# Patient Record
Sex: Female | Born: 1940 | Race: White | Hispanic: No | Smoking: Never smoker
Health system: Southern US, Community
[De-identification: ages and names within clinical notes are randomized; demographics above are authoritative.]

## PROBLEM LIST (undated history)

## (undated) DIAGNOSIS — K219 Gastro-esophageal reflux disease without esophagitis: Secondary | ICD-10-CM

## (undated) DIAGNOSIS — E039 Hypothyroidism, unspecified: Secondary | ICD-10-CM

## (undated) DIAGNOSIS — Z8719 Personal history of other diseases of the digestive system: Secondary | ICD-10-CM

## (undated) DIAGNOSIS — F32A Depression, unspecified: Secondary | ICD-10-CM

## (undated) DIAGNOSIS — F419 Anxiety disorder, unspecified: Secondary | ICD-10-CM

## (undated) DIAGNOSIS — Z87442 Personal history of urinary calculi: Secondary | ICD-10-CM

## (undated) DIAGNOSIS — F329 Major depressive disorder, single episode, unspecified: Secondary | ICD-10-CM

## (undated) DIAGNOSIS — M1990 Unspecified osteoarthritis, unspecified site: Secondary | ICD-10-CM

## (undated) DIAGNOSIS — M797 Fibromyalgia: Secondary | ICD-10-CM

## (undated) DIAGNOSIS — K449 Diaphragmatic hernia without obstruction or gangrene: Secondary | ICD-10-CM

## (undated) DIAGNOSIS — K5730 Diverticulosis of large intestine without perforation or abscess without bleeding: Secondary | ICD-10-CM

## (undated) DIAGNOSIS — K635 Polyp of colon: Secondary | ICD-10-CM

## (undated) HISTORY — DX: Anxiety disorder, unspecified: F41.9

## (undated) HISTORY — DX: Diverticulosis of large intestine without perforation or abscess without bleeding: K57.30

## (undated) HISTORY — DX: Personal history of other diseases of the digestive system: Z87.19

## (undated) HISTORY — DX: Fibromyalgia: M79.7

## (undated) HISTORY — DX: Hypothyroidism, unspecified: E03.9

## (undated) HISTORY — DX: Polyp of colon: K63.5

## (undated) HISTORY — DX: Diaphragmatic hernia without obstruction or gangrene: K44.9

## (undated) HISTORY — PX: HEMORRHOID SURGERY: SHX153

## (undated) HISTORY — PX: ANKLE SURGERY: SHX546

## (undated) HISTORY — DX: Major depressive disorder, single episode, unspecified: F32.9

## (undated) HISTORY — PX: RECTOCELE REPAIR: SHX761

## (undated) HISTORY — PX: BREAST BIOPSY: SHX20

## (undated) HISTORY — DX: Depression, unspecified: F32.A

## (undated) HISTORY — PX: TUBAL LIGATION: SHX77

## (undated) HISTORY — DX: Unspecified osteoarthritis, unspecified site: M19.90

## (undated) HISTORY — DX: Gastro-esophageal reflux disease without esophagitis: K21.9

## (undated) HISTORY — PX: ABDOMINAL HYSTERECTOMY: SHX81

---

## 1983-06-07 HISTORY — PX: APPENDECTOMY: SHX54

## 1983-06-07 HISTORY — PX: CHOLECYSTECTOMY: SHX55

## 1998-02-06 ENCOUNTER — Ambulatory Visit (HOSPITAL_COMMUNITY): Admission: RE | Admit: 1998-02-06 | Discharge: 1998-02-06 | Payer: Self-pay | Admitting: Family Medicine

## 1998-05-15 ENCOUNTER — Encounter: Admission: RE | Admit: 1998-05-15 | Discharge: 1998-08-13 | Payer: Self-pay

## 1998-06-17 ENCOUNTER — Ambulatory Visit: Admission: RE | Admit: 1998-06-17 | Discharge: 1998-06-17 | Payer: Self-pay

## 1998-09-29 ENCOUNTER — Encounter: Payer: Self-pay | Admitting: Gynecology

## 1998-09-29 ENCOUNTER — Ambulatory Visit (HOSPITAL_COMMUNITY): Admission: RE | Admit: 1998-09-29 | Discharge: 1998-09-29 | Payer: Self-pay | Admitting: Gynecology

## 1999-10-21 ENCOUNTER — Ambulatory Visit (HOSPITAL_COMMUNITY): Admission: RE | Admit: 1999-10-21 | Discharge: 1999-10-21 | Payer: Self-pay | Admitting: Gynecology

## 1999-10-21 ENCOUNTER — Encounter: Payer: Self-pay | Admitting: Gynecology

## 2000-01-19 ENCOUNTER — Encounter: Admission: RE | Admit: 2000-01-19 | Discharge: 2000-01-19 | Payer: Self-pay | Admitting: Family Medicine

## 2000-01-19 ENCOUNTER — Encounter: Payer: Self-pay | Admitting: Family Medicine

## 2000-10-23 ENCOUNTER — Encounter: Payer: Self-pay | Admitting: Gynecology

## 2000-10-23 ENCOUNTER — Ambulatory Visit (HOSPITAL_COMMUNITY): Admission: RE | Admit: 2000-10-23 | Discharge: 2000-10-23 | Payer: Self-pay | Admitting: Gynecology

## 2000-11-01 ENCOUNTER — Other Ambulatory Visit: Admission: RE | Admit: 2000-11-01 | Discharge: 2000-11-01 | Payer: Self-pay | Admitting: Gynecology

## 2001-10-11 ENCOUNTER — Encounter: Payer: Self-pay | Admitting: Family Medicine

## 2001-10-11 ENCOUNTER — Encounter: Admission: RE | Admit: 2001-10-11 | Discharge: 2001-10-11 | Payer: Self-pay | Admitting: Family Medicine

## 2001-10-26 ENCOUNTER — Emergency Department (HOSPITAL_COMMUNITY): Admission: EM | Admit: 2001-10-26 | Discharge: 2001-10-26 | Payer: Self-pay | Admitting: Emergency Medicine

## 2001-10-26 ENCOUNTER — Encounter: Payer: Self-pay | Admitting: Emergency Medicine

## 2001-11-05 ENCOUNTER — Other Ambulatory Visit: Admission: RE | Admit: 2001-11-05 | Discharge: 2001-11-05 | Payer: Self-pay | Admitting: Gynecology

## 2002-01-02 ENCOUNTER — Encounter: Payer: Self-pay | Admitting: Gastroenterology

## 2002-01-02 ENCOUNTER — Ambulatory Visit (HOSPITAL_COMMUNITY): Admission: RE | Admit: 2002-01-02 | Discharge: 2002-01-02 | Payer: Self-pay | Admitting: Gastroenterology

## 2002-01-02 DIAGNOSIS — K222 Esophageal obstruction: Secondary | ICD-10-CM

## 2002-03-12 ENCOUNTER — Encounter: Payer: Self-pay | Admitting: Gynecology

## 2002-03-12 ENCOUNTER — Ambulatory Visit (HOSPITAL_COMMUNITY): Admission: RE | Admit: 2002-03-12 | Discharge: 2002-03-12 | Payer: Self-pay | Admitting: Gynecology

## 2002-03-14 ENCOUNTER — Encounter: Admission: RE | Admit: 2002-03-14 | Discharge: 2002-03-14 | Payer: Self-pay | Admitting: Gynecology

## 2002-03-14 ENCOUNTER — Encounter: Payer: Self-pay | Admitting: Gynecology

## 2002-04-07 ENCOUNTER — Encounter: Admission: RE | Admit: 2002-04-07 | Discharge: 2002-04-07 | Payer: Self-pay | Admitting: Orthopedic Surgery

## 2002-04-07 ENCOUNTER — Encounter: Payer: Self-pay | Admitting: Orthopedic Surgery

## 2002-05-17 ENCOUNTER — Encounter: Payer: Self-pay | Admitting: Gastroenterology

## 2002-12-04 ENCOUNTER — Other Ambulatory Visit: Admission: RE | Admit: 2002-12-04 | Discharge: 2002-12-04 | Payer: Self-pay | Admitting: Gynecology

## 2003-04-10 ENCOUNTER — Encounter: Admission: RE | Admit: 2003-04-10 | Discharge: 2003-04-10 | Payer: Self-pay | Admitting: Gynecology

## 2003-12-03 ENCOUNTER — Other Ambulatory Visit: Admission: RE | Admit: 2003-12-03 | Discharge: 2003-12-03 | Payer: Self-pay | Admitting: Gynecology

## 2004-04-22 ENCOUNTER — Ambulatory Visit: Payer: Self-pay | Admitting: Family Medicine

## 2004-05-06 ENCOUNTER — Encounter: Admission: RE | Admit: 2004-05-06 | Discharge: 2004-05-06 | Payer: Self-pay | Admitting: Gynecology

## 2004-05-27 ENCOUNTER — Ambulatory Visit: Payer: Self-pay | Admitting: Family Medicine

## 2004-07-05 ENCOUNTER — Ambulatory Visit: Payer: Self-pay | Admitting: Family Medicine

## 2004-08-02 ENCOUNTER — Ambulatory Visit: Payer: Self-pay | Admitting: Family Medicine

## 2004-08-11 ENCOUNTER — Ambulatory Visit: Payer: Self-pay | Admitting: Family Medicine

## 2004-10-27 ENCOUNTER — Ambulatory Visit: Payer: Self-pay | Admitting: Family Medicine

## 2004-11-10 ENCOUNTER — Ambulatory Visit: Payer: Self-pay | Admitting: Family Medicine

## 2004-12-06 ENCOUNTER — Other Ambulatory Visit: Admission: RE | Admit: 2004-12-06 | Discharge: 2004-12-06 | Payer: Self-pay | Admitting: Gynecology

## 2004-12-22 ENCOUNTER — Ambulatory Visit: Payer: Self-pay | Admitting: Family Medicine

## 2005-02-22 ENCOUNTER — Ambulatory Visit: Payer: Self-pay | Admitting: Family Medicine

## 2005-03-28 ENCOUNTER — Ambulatory Visit: Payer: Self-pay | Admitting: Family Medicine

## 2005-04-29 ENCOUNTER — Ambulatory Visit: Payer: Self-pay | Admitting: Family Medicine

## 2005-06-22 ENCOUNTER — Encounter: Admission: RE | Admit: 2005-06-22 | Discharge: 2005-06-22 | Payer: Self-pay | Admitting: *Deleted

## 2005-07-05 ENCOUNTER — Observation Stay (HOSPITAL_COMMUNITY): Admission: RE | Admit: 2005-07-05 | Discharge: 2005-07-06 | Payer: Self-pay | Admitting: *Deleted

## 2005-12-01 ENCOUNTER — Other Ambulatory Visit: Admission: RE | Admit: 2005-12-01 | Discharge: 2005-12-01 | Payer: Self-pay | Admitting: *Deleted

## 2006-03-28 ENCOUNTER — Ambulatory Visit: Payer: Self-pay | Admitting: Family Medicine

## 2006-06-21 ENCOUNTER — Ambulatory Visit: Payer: Self-pay | Admitting: Family Medicine

## 2006-06-26 ENCOUNTER — Encounter: Admission: RE | Admit: 2006-06-26 | Discharge: 2006-06-26 | Payer: Self-pay | Admitting: *Deleted

## 2006-07-18 ENCOUNTER — Ambulatory Visit: Payer: Self-pay | Admitting: Family Medicine

## 2006-08-18 ENCOUNTER — Ambulatory Visit: Payer: Self-pay | Admitting: Family Medicine

## 2006-10-17 ENCOUNTER — Ambulatory Visit: Payer: Self-pay | Admitting: Family Medicine

## 2007-01-02 ENCOUNTER — Other Ambulatory Visit: Admission: RE | Admit: 2007-01-02 | Discharge: 2007-01-02 | Payer: Self-pay | Admitting: *Deleted

## 2007-05-02 ENCOUNTER — Encounter: Admission: RE | Admit: 2007-05-02 | Discharge: 2007-05-02 | Payer: Self-pay | Admitting: Family Medicine

## 2007-05-08 ENCOUNTER — Ambulatory Visit: Payer: Self-pay | Admitting: Gastroenterology

## 2007-05-10 ENCOUNTER — Encounter: Admission: RE | Admit: 2007-05-10 | Discharge: 2007-05-10 | Payer: Self-pay | Admitting: Family Medicine

## 2007-06-27 DIAGNOSIS — I10 Essential (primary) hypertension: Secondary | ICD-10-CM | POA: Insufficient documentation

## 2007-06-27 DIAGNOSIS — E039 Hypothyroidism, unspecified: Secondary | ICD-10-CM | POA: Insufficient documentation

## 2007-06-27 DIAGNOSIS — K449 Diaphragmatic hernia without obstruction or gangrene: Secondary | ICD-10-CM | POA: Insufficient documentation

## 2007-06-27 DIAGNOSIS — R109 Unspecified abdominal pain: Secondary | ICD-10-CM | POA: Insufficient documentation

## 2007-06-27 DIAGNOSIS — K589 Irritable bowel syndrome without diarrhea: Secondary | ICD-10-CM

## 2007-06-27 DIAGNOSIS — E538 Deficiency of other specified B group vitamins: Secondary | ICD-10-CM | POA: Insufficient documentation

## 2007-06-27 DIAGNOSIS — R197 Diarrhea, unspecified: Secondary | ICD-10-CM | POA: Insufficient documentation

## 2007-06-27 DIAGNOSIS — T7840XA Allergy, unspecified, initial encounter: Secondary | ICD-10-CM | POA: Insufficient documentation

## 2007-06-27 DIAGNOSIS — F32A Depression, unspecified: Secondary | ICD-10-CM | POA: Insufficient documentation

## 2007-06-27 DIAGNOSIS — K5900 Constipation, unspecified: Secondary | ICD-10-CM | POA: Insufficient documentation

## 2007-06-27 DIAGNOSIS — K219 Gastro-esophageal reflux disease without esophagitis: Secondary | ICD-10-CM | POA: Insufficient documentation

## 2007-06-27 DIAGNOSIS — M1990 Unspecified osteoarthritis, unspecified site: Secondary | ICD-10-CM | POA: Insufficient documentation

## 2007-06-27 DIAGNOSIS — A048 Other specified bacterial intestinal infections: Secondary | ICD-10-CM | POA: Insufficient documentation

## 2007-06-27 DIAGNOSIS — F411 Generalized anxiety disorder: Secondary | ICD-10-CM | POA: Insufficient documentation

## 2007-06-27 DIAGNOSIS — K429 Umbilical hernia without obstruction or gangrene: Secondary | ICD-10-CM | POA: Insufficient documentation

## 2007-06-27 DIAGNOSIS — F329 Major depressive disorder, single episode, unspecified: Secondary | ICD-10-CM

## 2007-06-27 DIAGNOSIS — K5909 Other constipation: Secondary | ICD-10-CM | POA: Insufficient documentation

## 2007-06-27 DIAGNOSIS — K3184 Gastroparesis: Secondary | ICD-10-CM

## 2007-09-18 ENCOUNTER — Encounter: Admission: RE | Admit: 2007-09-18 | Discharge: 2007-09-18 | Payer: Self-pay | Admitting: *Deleted

## 2008-01-04 ENCOUNTER — Encounter: Admission: RE | Admit: 2008-01-04 | Discharge: 2008-01-04 | Payer: Self-pay | Admitting: Family Medicine

## 2008-02-28 ENCOUNTER — Other Ambulatory Visit: Admission: RE | Admit: 2008-02-28 | Discharge: 2008-02-28 | Payer: Self-pay | Admitting: Gynecology

## 2008-09-18 ENCOUNTER — Encounter: Admission: RE | Admit: 2008-09-18 | Discharge: 2008-09-18 | Payer: Self-pay | Admitting: Gynecology

## 2009-02-24 ENCOUNTER — Telehealth: Payer: Self-pay | Admitting: Gastroenterology

## 2009-04-08 ENCOUNTER — Ambulatory Visit: Payer: Self-pay | Admitting: Gastroenterology

## 2009-10-06 ENCOUNTER — Ambulatory Visit: Payer: Self-pay | Admitting: Gastroenterology

## 2009-10-08 ENCOUNTER — Encounter (INDEPENDENT_AMBULATORY_CARE_PROVIDER_SITE_OTHER): Payer: Self-pay | Admitting: *Deleted

## 2009-10-08 ENCOUNTER — Encounter: Admission: RE | Admit: 2009-10-08 | Discharge: 2009-10-08 | Payer: Self-pay | Admitting: Gynecology

## 2009-10-19 ENCOUNTER — Encounter (INDEPENDENT_AMBULATORY_CARE_PROVIDER_SITE_OTHER): Payer: Self-pay | Admitting: *Deleted

## 2009-10-21 ENCOUNTER — Ambulatory Visit: Payer: Self-pay | Admitting: Gastroenterology

## 2009-11-04 ENCOUNTER — Ambulatory Visit: Payer: Self-pay | Admitting: Gastroenterology

## 2009-11-06 ENCOUNTER — Encounter: Payer: Self-pay | Admitting: Gastroenterology

## 2009-11-13 ENCOUNTER — Ambulatory Visit: Payer: Self-pay | Admitting: Gastroenterology

## 2010-07-06 NOTE — Letter (Signed)
Summary: Patient [facility] Biopsy Results  [facility]  [address] [place], [place] [address]   Phone: [phone]  Fax: [phone]        November 06, 2009 MRN: [id number]    [facility] [address] [place], [place]  [address]    Dear Ms. Peffley,  I am pleased to inform you that the biopsies taken during your recent endoscopic examination did not show any evidence of cancer upon pathologic examination.No evidence of Helicobacter infection seen on biopsy review.  Additional information/recommendations:  __No further action is needed at this time.  Please follow-up with      your primary care physician for your other healthcare needs.  __ Please call [phone] to schedule a return visit to review      your condition.  x__ Continue with the treatment plan as outlined on the day of your      exam.Please continue treatment for acid reflux disease.  __ You should have a repeat endoscopic examination for this problem              in _ months/years.   Please call us if you are having persistent problems or have questions about your condition that have not been fully answered at this time.  Sincerely,  Mardella Layman MD [facility]  This letter has been electronically signed by your physician.  Appended Document: Patient Notice-Endo Biopsy Results letter mailed.

## 2010-07-06 NOTE — Miscellaneous (Signed)
Summary: Orders Update Clotest  Clinical Lists Changes  Orders: Added new Test order of TLB-H Pylori Screen Gastric Biopsy (83013-CLOTEST) - Signed 

## 2010-07-06 NOTE — Letter (Signed)
Summary: Previsit letter  [facility]  [address] [place], [place] [address]   Phone: [phone]  Fax: [phone]       10/08/2009 MRN: [id number]  [facility] [address] [place], [place]  [address]  Dear Ms. Canevari,  Welcome to the Gastroenterology Division at [facility].    You are scheduled to see a nurse for your pre-procedure visit on 10-21-09 at 11:00a.m. on the 3rd floor at [facility], [address]  We ask that you try to arrive at our office 15 minutes prior to your appointment time to allow for check-in.  Your nurse visit will consist of discussing your medical and surgical history, your immediate family medical history, and your medications.    Please bring a complete list of all your medications or, if you prefer, bring the medication bottles and we will list them.  We will need to be aware of both prescribed and over the counter drugs.  We will need to know exact dosage information as well.  If you are on blood thinners (Coumadin, Plavix, Aggrenox, Ticlid, etc.) please call our office today/prior to your appointment, as we need to consult with your physician about holding your medication.   Please be prepared to read and sign documents such as consent forms, a financial agreement, and acknowledgement forms.  If necessary, and with your consent, a friend or relative is welcome to sit-in on the nurse visit with you.  Please bring your insurance card so that we may make a copy of it.  If your insurance requires a referral to see a specialist, please bring your referral form from your primary care physician.  No co-pay is required for this nurse visit.     If you cannot keep your appointment, please call [phone] to cancel or reschedule prior to your appointment date.  This allows [place] the opportunity to schedule an appointment for another patient in need of care.    Thank you for choosing [facility] for your medical needs.  We  appreciate the opportunity to care for you.  Please visit [place] at our website  to learn more about our practice.                     Sincerely.                                                                                                                   The Gastroenterology Division

## 2010-07-06 NOTE — Procedures (Signed)
Summary: Colonoscopy  Patient: Stefanie [name] Note: All result statuses are Final unless otherwise noted.  Tests: (1) Colonoscopy (COL)   COL Colonoscopy           DONE     [facility]     [address]     [place], [place]  [address]           COLONOSCOPY PROCEDURE REPORT           PATIENT:  Stefanie, [name]  MR#:  [id number]     BIRTHDATE:  1941/03/28, 69 yrs. old  GENDER:  female     ENDOSCOPIST:  Vania Rea. Jarold Motto, MD, [facility]     REF. BY:     PROCEDURE DATE:  11/04/2009     PROCEDURE:  Colonoscopy with snare polypectomy     ASA CLASS:  Class II     INDICATIONS:  colorectal cancer screening, average risk     MEDICATIONS:   Fentanyl 75 mcg IV, Versed 9 mg IV           DESCRIPTION OF PROCEDURE:   After the risks benefits and     alternatives of the procedure were thoroughly explained, informed     consent was obtained.  Digital rectal exam was performed and     revealed no abnormalities.   The [id number] endoscope was     introduced through the anus and advanced to the cecum, which was     identified by both the appendix and ileocecal valve, without     limitations.  The quality of the prep was adequate, using     MoviPrep.  The instrument was then slowly withdrawn as the colon     was fully examined.     <<PROCEDUREIMAGES>>           FINDINGS:  Moderate diverticulosis was found in the sigmoid to     descending colon segments. RED,THICKENED,HAUSTRAL FOLDS NOTED.  A     sessile polyp was found. FLAT POLYP SNARE EXCISED.COLD.     Retroflexed views in the rectum revealed no abnormalities.    The     scope was then withdrawn from the patient and the procedure     completed.           COMPLICATIONS:  None     ENDOSCOPIC IMPRESSION:     1) Moderate diverticulosis in the sigmoid to descending colon     segments     2) Sessile polyp     HX. OF ADHESIONS AND CHRONIC PAIN.     RECOMMENDATIONS:     1) Follow up colonoscopy in 5 years     2) High fiber diet.  REPEAT EXAM:  No           ______________________________     Vania Rea. Jarold Motto, MD, [facility]           CC:  Joette Catching, MD           n.     Rosalie Doctor:   Vania Rea. Haynes Giannotti at 11/04/2009 10:02 AM           Daleen Squibb, [place], [id number]  Note: An exclamation mark (!) indicates a result that was not dispersed into the flowsheet. Document Creation Date: 11/04/2009 10:04 AM _______________________________________________________________________  (1) Order result status: Final Collection or observation date-time: 11/04/2009 09:56 Requested date-time:  Receipt date-time:  Reported date-time:  Referring Physician:   Ordering Physician: Sheryn Bison [phone]) Specimen Source:  Source: Launa Grill Order Number: [id number]  Lab site:   Appended Document: Colonoscopy f/u 10y ,not 5.  Appended Document: Colonoscopy     Procedures Next Due Date:    Colonoscopy: 11/2019

## 2010-07-06 NOTE — Assessment & Plan Note (Signed)
Summary: Post Proc, 2 weeks. dfs    History of Present Illness Visit Type: Follow-up Visit Primary GI MD: Sheryn Bison MD FACP FAGA Primary Provider: Harlon Ditty Nyland,MD Requesting Provider: n/a Chief Complaint: f/u ECL, Pt states her dysphagia is much better but still has problems with allergies so her food gets hung up with drainage from that. No other GI sx at this time.  History of Present Illness:   Her dysphasia has resolved and she is taking daily Zegerid and denies GERD symptoms. She continues with a vague gas, bloating, and right lower quadrant-right inguinal discomfort and mild constipation. She also is on daily Synthroid, Lexapro, vitamin D, and passes taken probiotics. She denies melena, hematochezia, hepatobiliary complaints, et Karie Soda. She recently completed endoscopy and colonoscopy esophageal dilatation. Gastric evaluation for Helicobacter are negative.   GI Review of Systems      Denies abdominal pain, acid reflux, belching, bloating, chest pain, dysphagia with liquids, dysphagia with solids, heartburn, loss of appetite, nausea, vomiting, vomiting blood, weight loss, and  weight gain.        Denies anal fissure, black tarry stools, change in bowel habit, constipation, diarrhea, diverticulosis, fecal incontinence, heme positive stool, hemorrhoids, irritable bowel syndrome, jaundice, light color stool, liver problems, rectal bleeding, and  rectal pain.    Current Medications (verified): 1)  Synthroid 50 Mcg Tabs (Levothyroxine Sodium) .... Qd 2)  Lexapro 10 Mg Tabs (Escitalopram Oxalate) .... 1/2 By Mouth Once Daily At Bedtime 3)  Zegerid 40-1100 Mg Caps (Omeprazole-Sodium Bicarbonate) .... Qd 4)  Nasacort Aq 55 Mcg/act Aers (Triamcinolone Acetonide(Nasal)) .... Qd 5)  Vitamin D (Ergocalciferol) 50000 Unit Caps (Ergocalciferol) .Marland Kitchen.. 1 By Mouth Every Week  Allergies (verified): 1)  ! * Novacaine 2)  ! Reglan 3)  ! Zithromax 4)  ! Penicillin  Past  History:  Past medical, surgical, family and social histories (including risk factors) reviewed for relevance to current acute and chronic problems.  Past Medical History: Reviewed history from 06/27/2007 and no changes required. Current Problems:  ESOPHAGEAL STRICTURE (ICD-530.3) ALLERGY (ICD-995.3) B12 DEFICIENCY (ICD-266.2) DEGENERATIVE JOINT DISEASE (ICD-715.90) HYPERTENSION (ICD-401.9) DEPRESSION (ICD-311) ANXIETY DISORDER (ICD-300.00) HYPOTHYROIDISM (ICD-244.9) GERD (ICD-530.81) * INTESTINAL ADHESIONS ABDOMINAL PAIN, CHRONIC (ICD-789.00) PERIUMBILICAL HERNIA (ICD-553.1) HELICOBACTER PYLORI GASTRITIS (ICD-041.86) GASTROPARESIS (ICD-536.3) HIATAL HERNIA (ICD-553.3) CONSTIPATION (ICD-564.00) DIARRHEA (ICD-787.91) IBS (ICD-564.1)  Past Surgical History: Reviewed history from 06/27/2007 and no changes required. cystocele and rectocele repair cholecystectomy hemorrhoidectomy hysterectomy and bilateral oophorectomy appendectomy  Family History: Reviewed history from 10/06/2009 and no changes required. No FH of Colon Cancer: Family History of Heart Disease: Father CHF Mother had COPD  Social History: Reviewed history from 10/06/2009 and no changes required. Patient has never smoked.  Alcohol Use - no Daily Caffeine Use  on occasion Illicit Drug Use - no  Review of Systems  The patient denies allergy/sinus, anemia, anxiety-new, arthritis/joint pain, back pain, blood in urine, breast changes/lumps, change in vision, confusion, cough, coughing up blood, depression-new, fainting, fatigue, fever, headaches-new, hearing problems, heart murmur, heart rhythm changes, itching, menstrual pain, muscle pains/cramps, night sweats, nosebleeds, pregnancy symptoms, shortness of breath, skin rash, sleeping problems, sore throat, swelling of feet/legs, swollen lymph glands, thirst - excessive , urination - excessive , urination changes/pain, urine leakage, vision changes, and voice  change.    Vital Signs:  Patient profile:   70 year old female Height:      66 inches Weight:      188.38 pounds BMI:     30.52 Pulse rate:   80 /  minute Pulse rhythm:   regular BP sitting:   124 / 80  (right arm) Cuff size:   regular  Vitals Entered By: Christie Nottingham CMA Duncan Dull) (November 13, 2009 10:40 AM)  Physical Exam  General:  Well developed, well nourished, no acute distress.healthy appearing.  healthy appearing.   Head:  Normocephalic and atraumatic. Eyes:  PERRLA, no icterus.exam deferred to patient's ophthalmologist.  exam deferred to patient's ophthalmologist.   Abdomen:  Soft, nontender and nondistended. No masses, hepatosplenomegaly or hernias noted. Normal bowel sounds.obese.  obese.   Psych:  Alert and cooperative. Normal mood and affect.   Impression & Recommendations:  Problem # 1:  ESOPHAGEAL STRICTURE (ICD-530.3) Assessment Improved Continue chronic reflux maneuvers and daily Zegerid 40 mg 30 minutes before the first meal the day.  Problem # 2:  ABDOMINAL PAIN, CHRONIC (ICD-789.00) Assessment: Improved High fiber diet as tolerated and we will try [employer] Colon Health fiber-probiotic pill twice a day.  Problem # 3:  B12 DEFICIENCY (ICD-266.2) Assessment: Improved Continued weekly B12 nasal spray  Patient Instructions: 1)  Continue Zegerid. 2)  Begin [facility] two times a day. OTC. 3)  The medication list was reviewed and reconciled.  All changed / newly prescribed medications were explained.  A complete medication list was provided to the patient / caregiver. 4)  Copy sent to : Dr. Joette Catching. 5)  Please continue current medications.  6)  Avoid foods high in acid content ( tomatoes, citrus juices, spicy foods) . Avoid eating within 3 to 4 hours of lying down or before exercising. Do not over eat; try smaller more frequent meals. Elevate head of bed four inches when sleeping.  7)  High Fiber, Low Fat  Healthy Eating Plan brochure given.    Appended Document: Post Proc, 2 weeks. dfs    Clinical Lists Changes  Medications: Added new medication of [employer] COLON HEALTH  CAPS (PROBIOTIC PRODUCT) two times a day

## 2010-07-06 NOTE — Miscellaneous (Signed)
Summary: lec pREVISIT/PREP  Clinical Lists Changes  Medications: Added new medication of MOVIPREP 100 GM  SOLR (PEG-KCL-NACL-NASULF-NA ASC-C) As per prep instructions. - Signed Rx of MOVIPREP 100 GM  SOLR (PEG-KCL-NACL-NASULF-NA ASC-C) As per prep instructions.;  #1 x 0;  Signed;  Entered by: Wyona Almas RN;  Authorized by: Mardella Layman MD [facility];  Method used: Electronically to [facility]*, [address], [place], [place], [place]  [address], Ph: [phone], Fax: [phone] Observations: Added new observation of ALLERGY REV: Done (10/21/2009 10:49)    Prescriptions: MOVIPREP 100 GM  SOLR (PEG-KCL-NACL-NASULF-NA ASC-C) As per prep instructions.  #1 x 0   Entered by:   Wyona Almas RN   Authorized by:   Mardella Layman MD [facility]   Signed by:   Wyona Almas RN on 10/21/2009   Method used:   Electronically to        [facility] (retail)       [address]       [place], [place]  [address]       Ph: [phone]       Fax: [phone]   RxID:   [id number]

## 2010-07-06 NOTE — Procedures (Signed)
Summary: Upper Endoscopy  Patient: Stefanie [name] Note: All result statuses are Final unless otherwise noted.  Tests: (1) Upper Endoscopy (EGD)   EGD Upper Endoscopy       DONE     [facility]     [address]     [place], [place]  [address]           ENDOSCOPY PROCEDURE REPORT           PATIENT:  [name], [name]  MR#:  [id number]     BIRTHDATE:  Sep 26, 1940, 69 yrs. old  GENDER:  female           ENDOSCOPIST:  Vania Rea. Jarold Motto, MD, [facility]     Referred by:           PROCEDURE DATE:  11/04/2009     PROCEDURE:  EGD with biopsy, Elease Hashimoto Dilation of Esophagus     ASA CLASS:  Class II     INDICATIONS:  dysphagia, GERD           MEDICATIONS:   There was residual sedation effect present from     prior procedure., Fentanyl 25 mcg IV, Versed 1 mg IV     TOPICAL ANESTHETIC:           DESCRIPTION OF PROCEDURE:   After the risks benefits and     alternatives of the procedure were thoroughly explained, informed     consent was obtained.  The LB GIF-H180 [id number] endoscope was     introduced through the mouth and advanced to the second portion of     the duodenum, without limitations.  The instrument was slowly     withdrawn as the mucosa was fully examined.     <<PROCEDUREIMAGES>>           A stricture was found. SMOOTH,BENIGN,FIBROTIC STRICTURE DILATED     #64F MALONEY DILATOR.  A hiatal hernia was found. 5 CM. 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 DIFFUSE REDNESS, EDEMA, AND GASTRIC     INFLAMMATION BIOPSIED AND CLO BX. DONE.  Normal duodenal folds     were noted.  The esophagus and gastroesophageal junction were     completely normal in appearance. SCARRING OD DISTAL ESOPHAGUS     NOTED.    Retroflexed views revealed a hiatal hernia.    The scope     was then withdrawn from the patient and the procedure completed.     COMPLICATIONS:  None           ENDOSCOPIC IMPRESSION:     1) Stricture     2) Hiatal hernia     3) Gastropathy     4) Normal duodenal folds     5) Normal  esophagus     6) A hiatal hernia     #1.CHRONIC GERD AND HH     #2.ESOPHAGEAL STRICTURE DILATED.     #3.R/O H.PYLORI./.     RECOMMENDATIONS:     1) Anti-reflux regimen to be follow     2) Await pathology results     3) Clear liquids until, then soft foods rest iof day. Resume     prior diet tomorrow.     4) Rx CLO if positive     5) continue PPI           REPEAT EXAM:  No           ______________________________     Vania Rea. Jarold Motto, MD, [facility]  CC:  Joette Catching, MD           n.     Rosalie DoctorVania Rea. Wylee Dorantes at 11/04/2009 10:17 AM           Daleen Squibb, [place], [id number]  Note: An exclamation mark (!) indicates a result that was not dispersed into the flowsheet. Document Creation Date: 11/04/2009 10:18 AM _______________________________________________________________________  (1) Order result status: Final Collection or observation date-time: 11/04/2009 10:10 Requested date-time:  Receipt date-time:  Reported date-time:  Referring Physician:   Ordering Physician: Sheryn Bison [phone]) Specimen Source:  Source: Launa Grill Order Number: [id number] Lab site:

## 2010-07-06 NOTE — Assessment & Plan Note (Signed)
Summary: dysphagia--ch.    History of Present Illness Visit Type: Follow-up Visit Primary GI MD: Sheryn Bison MD FACP FAGA Primary Provider: Harlon Ditty Nyland,MD Requesting Provider: Mickle Plumb Chief Complaint: Consult for Colon/Endo  pt c/o dysphagia History of Present Illness:   70 year old Caucasian female with chronic acid reflux has had progressive solid food dysphagia. She is on daily Zegerid 40 mg. She was last seen several years ago and endoscopy and followup colonoscopy was suggested but not completed. She denies lower gastrointestinal problems at this time. She's had a lot of stress related GI issues including gas, bloating, and abdominal pain. Since she was last seen she has had repair of a cystocele and rectocele. She's had multiple surgeries including cholecystectomy, hysterectomy with bilateral removal of her ovaries, and appendectomy. She has no current symptoms consistent with bowel obstruction, and denies anorexia, weight loss, or a specific food intolerances. She has had multiple drug reactions in the past and are listed in her chart. She denies concurrent hepatobiliary complaints.She Is Not Abusing NSAIDs, alcohol or cigarettes.   GI Review of Systems    Reports abdominal pain, dysphagia with solids, and  nausea.      Denies acid reflux, belching, bloating, chest pain, dysphagia with liquids, heartburn, loss of appetite, vomiting, vomiting blood, weight loss, and  weight gain.      Reports change in bowel habits and  diarrhea.     Denies anal fissure, black tarry stools, constipation, diverticulosis, fecal incontinence, heme positive stool, hemorrhoids, irritable bowel syndrome, jaundice, light color stool, liver problems, rectal bleeding, and  rectal pain. Preventive Screening-Counseling & Management  Alcohol-Tobacco     Smoking Status: never      Drug Use:  no.      Current Medications (verified): 1)  Synthroid 50 Mcg Tabs (Levothyroxine Sodium) ....  Qd 2)  Lexapro 10 Mg Tabs (Escitalopram Oxalate) .... 1/2 By Mouth Once Daily At Bedtime 3)  Zegerid 40-1100 Mg Caps (Omeprazole-Sodium Bicarbonate) .... Qd 4)  Nasacort Aq 55 Mcg/act Aers (Triamcinolone Acetonide(Nasal)) .... Qd 5)  Vitamin D (Ergocalciferol) 50000 Unit Caps (Ergocalciferol) .Marland Kitchen.. 1 By Mouth Every Week  Allergies (verified): 1)  ! * Novacaine 2)  ! Reglan 3)  ! Zithromax 4)  ! Penicillin  Past History:  Past medical, surgical, family and social histories (including risk factors) reviewed for relevance to current acute and chronic problems.  Past Medical History: Reviewed history from 06/27/2007 and no changes required. Current Problems:  ESOPHAGEAL STRICTURE (ICD-530.3) ALLERGY (ICD-995.3) B12 DEFICIENCY (ICD-266.2) DEGENERATIVE JOINT DISEASE (ICD-715.90) HYPERTENSION (ICD-401.9) DEPRESSION (ICD-311) ANXIETY DISORDER (ICD-300.00) HYPOTHYROIDISM (ICD-244.9) GERD (ICD-530.81) * INTESTINAL ADHESIONS ABDOMINAL PAIN, CHRONIC (ICD-789.00) PERIUMBILICAL HERNIA (ICD-553.1) HELICOBACTER PYLORI GASTRITIS (ICD-041.86) GASTROPARESIS (ICD-536.3) HIATAL HERNIA (ICD-553.3) CONSTIPATION (ICD-564.00) DIARRHEA (ICD-787.91) IBS (ICD-564.1)  Past Surgical History: Reviewed history from 06/27/2007 and no changes required. cystocele and rectocele repair cholecystectomy hemorrhoidectomy hysterectomy and bilateral oophorectomy appendectomy  Family History: Reviewed history and no changes required. No FH of Colon Cancer: Family History of Heart Disease: Father CHF Mother had COPD  Social History: Reviewed history and no changes required. Patient has never smoked.  Alcohol Use - no Daily Caffeine Use  on occasion Illicit Drug Use - no Smoking Status:  never Drug Use:  no  Review of Systems       The patient complains of allergy/sinus, arthritis/joint pain, and back pain.  The patient denies anemia, anxiety-new, blood in urine, breast changes/lumps, change in  vision, confusion, cough, coughing up blood, depression-new, fainting, fatigue, fever, headaches-new,  hearing problems, heart murmur, heart rhythm changes, itching, menstrual pain, muscle pains/cramps, night sweats, nosebleeds, pregnancy symptoms, shortness of breath, skin rash, sleeping problems, sore throat, swelling of feet/legs, swollen lymph glands, thirst - excessive , urination - excessive , urination changes/pain, urine leakage, vision changes, and voice change.    Vital Signs:  Patient profile:   70 year old female Height:      66 inches Weight:      190 pounds BMI:     30.78 BSA:     1.96 Pulse rate:   80 / minute Pulse rhythm:   regular BP sitting:   126 / 84  (left arm)  Vitals Entered By: Merri Ray CMA Duncan Dull) (Oct 06, 2009 3:48 PM)  Physical Exam  General:  Well developed, well nourished, no acute distress.healthy appearing.   Head:  Normocephalic and atraumatic. Lungs:  Clear throughout to auscultation. Heart:  Regular rate and rhythm; no murmurs, rubs,  or bruits. Abdomen:  Soft, nontender and nondistended. No masses, hepatosplenomegaly or hernias noted. Normal bowel sounds. Rectal:  deferred until time of colonoscopy.   Extremities:  No clubbing, cyanosis, edema or deformities noted. Neurologic:  Alert and  oriented x4;  grossly normal neurologically. Inguinal Nodes:  No significant inguinal adenopathy. Psych:  Alert and cooperative. Normal mood and affect.   Impression & Recommendations:  Problem # 1:  GERD (ICD-530.81) Assessment Improved Continue reflex edema daily Zegerid. Because of her dysphagia we have scheduled endoscopy and possible dilatation. The risk and benefits of this procedure have been explained to her in detail. She is concerned her the possibility of celiac disease, and small bowel biopsy will be obtained along with examination for H. pylori.  Problem # 2:  ABDOMINAL PAIN, CHRONIC (ICD-789.00) Assessment: Improved Followup screening  colonoscopy scheduled at her convenience. She is continue high-fiber diet as tolerated.  Patient Instructions: 1)  Please continue current medications.  2)  Please stop back by to schedule endoscopy and colonoscopy at your convience. 3)  The medication list was reviewed and reconciled.  All changed / newly prescribed medications were explained.  A complete medication list was provided to the patient / caregiver. 4)  Copy sent to : Dr. Joette Catching. 5)  Please continue current medications.  6)  Constipation and Hemorrhoids brochure given.  7)  Colonoscopy and Flexible Sigmoidoscopy brochure given.  8)  Conscious Sedation brochure given.  9)  Upper Endoscopy with Dilatation brochure given.   Appended Document: dysphagia--ch. Pt will call back to sch procedures after talking with Dtr.  Pt knows she will need previsit prior to proc.

## 2010-07-06 NOTE — Letter (Signed)
Summary: [facility] Instructions  [facility]  [address] [place], [place] [address]   Phone: [phone]  Fax: [phone]       [facility]    08-Jun-1940    MRN: [id number]        Procedure Day Dorna Bloom:  [facility]  11/04/09     Arrival Time:  8:30AM     Procedure Time:  9:30AM     Location of Procedure:                    _X _  [facility] (4th Floor)                       PREPARATION FOR COLONOSCOPY WITH MOVIPREP   Starting 5 days prior to your procedure 10/30/09 do not eat nuts, seeds, popcorn, corn, beans, peas,  salads, or any raw vegetables.  Do not take any fiber supplements (e.g. Metamucil, Citrucel, and Benefiber).  THE DAY BEFORE YOUR PROCEDURE         DATE: 11/03/09  DAY: TUESDAY  1.  Drink clear liquids the entire day-NO SOLID FOOD  2.  Do not drink anything colored red or purple.  Avoid juices with pulp.  No orange juice.  3.  Drink at least 64 oz. (8 glasses) of fluid/clear liquids during the day to prevent dehydration and help the prep work efficiently.  CLEAR LIQUIDS INCLUDE: Water Jello Ice Popsicles Tea (sugar ok, no milk/cream) Powdered fruit flavored drinks Coffee (sugar ok, no milk/cream) Gatorade Juice: apple, white grape, white cranberry  Lemonade Clear bullion, consomm, broth Carbonated beverages (any kind) Strained chicken noodle soup Hard Candy                             4.  In the morning, mix first dose of MoviPrep solution:    Empty 1 Pouch A and 1 Pouch B into the disposable container    Add lukewarm drinking water to the top line of the container. Mix to dissolve    Refrigerate (mixed solution should be used within 24 hrs)  5.  Begin drinking the prep at 5:00 p.m. The MoviPrep container is divided by 4 marks.   Every 15 minutes drink the solution down to the next mark (approximately 8 oz) until the full liter is complete.   6.  Follow completed prep with 16 oz of clear liquid of your choice (Nothing  red or purple).  Continue to drink clear liquids until bedtime.  7.  Before going to bed, mix second dose of MoviPrep solution:    Empty 1 Pouch A and 1 Pouch B into the disposable container    Add lukewarm drinking water to the top line of the container. Mix to dissolve    Refrigerate  THE DAY OF YOUR PROCEDURE      DATE: 11/04/09  DAY: WEDNESDAY  Beginning at 4:30AM (5 hours before procedure):         1. Every 15 minutes, drink the solution down to the next mark (approx 8 oz) until the full liter is complete.  2. Follow completed prep with 16 oz. of clear liquid of your choice.    3. You may drink clear liquids until 7:30AM (2 HOURS BEFORE PROCEDURE).   MEDICATION INSTRUCTIONS  Unless otherwise instructed, you should take regular prescription medications with a small sip of water   as early as possible the morning of  your procedure.           OTHER INSTRUCTIONS  You will need a responsible adult at least 70 years of age to accompany you and drive you home.   This person must remain in the waiting room during your procedure.  Wear loose fitting clothing that is easily removed.  Leave jewelry and other valuables at home.  However, you may wish to bring a book to read or  an iPod/MP3 player to listen to music as you wait for your procedure to start.  Remove all body piercing jewelry and leave at home.  Total time from sign-in until discharge is approximately 2-3 hours.  You should go home directly after your procedure and rest.  You can resume normal activities the  day after your procedure.  The day of your procedure you should not:   Drive   Make legal decisions   Operate machinery   Drink alcohol   Return to work  You will receive specific instructions about eating, activities and medications before you leave.    The above instructions have been reviewed and explained to me by   Wyona Almas RN  Oct 21, 2009 11:55 AM      I fully understand and  can verbalize these instructions _____________________________ Date _________

## 2010-07-06 NOTE — Letter (Signed)
Summary: Patient Notice- Polyp Results  [facility]  [address] [place], [place] [address]   Phone: [phone]  Fax: [phone]        November 06, 2009 MRN: [id number]    [facility] [address] [place], [place]  [address]    Dear Stefanie [name],  I am pleased to inform you that the colon polyp(s) removed during your recent colonoscopy was (were) found to be benign (no cancer detected) upon pathologic examination.  I recommend you have a repeat colonoscopy examination in 10_ years to look for recurrent polyps, as having colon polyps increases your risk for having recurrent polyps or even colon cancer in the future.No adenomatous polyps noted on exam.  Should you develop new or worsening symptoms of abdominal pain, bowel habit changes or bleeding from the rectum or bowels, please schedule an evaluation with either your primary care physician or with me.  Additional information/recommendations:  __ No further action with gastroenterology is needed at this time. Please      follow-up with your primary care physician for your other healthcare      needs.  __ Please call [phone] to schedule a return visit to review your      situation.  __ Please keep your follow-up visit as already scheduled.  x__ Continue treatment plan as outlined the day of your exam.  Please call us if you are having persistent problems or have questions about your condition that have not been fully answered at this time.  Sincerely,  Mardella Layman MD [facility]  This letter has been electronically signed by your physician.  Appended Document: Patient Notice- Polyp Results letter mailed.

## 2010-09-16 ENCOUNTER — Other Ambulatory Visit: Payer: Self-pay | Admitting: Gynecology

## 2010-09-16 DIAGNOSIS — Z1231 Encounter for screening mammogram for malignant neoplasm of breast: Secondary | ICD-10-CM

## 2010-10-11 ENCOUNTER — Ambulatory Visit
Admission: RE | Admit: 2010-10-11 | Discharge: 2010-10-11 | Disposition: A | Discharge status: Home / Self Care | Payer: MEDICARE | Source: Ambulatory Visit | Attending: Gynecology | Admitting: Gynecology

## 2010-10-11 DIAGNOSIS — Z1231 Encounter for screening mammogram for malignant neoplasm of breast: Secondary | ICD-10-CM

## 2010-10-19 NOTE — Assessment & Plan Note (Signed)
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  She comes  to the office for scheduled followup colonoscopy evaluation.   HISTORY OF PRESENT ILLNESS:  Stefanie [name] has had chronic irritable bowel  syndrome with alternating diarrhea and constipation and had a previous  rather extensive GI workup in 2003 which included endoscopic exam  showing a 5 cm hiatal hernia, evidence of gastroparesis, and apparently  a negative colonoscopy exam.  Her past history at that time was  remarkable for chronic IBS, previously  being treated for Helicobacter  pylori gastritis, and a history of a periumbilical hernia.  As mentioned  above, extensive GI evaluation includes numerous stool exams and  radiographic procedures were all normal and it was felt that a lot of  her chronic abdominal pain was related to intestinal adhesions.  She did  have a small bowel series in 1995.  Really, I have not seen this patient  since 2003 and she has been treated for acid reflux and constipation.   She currently has mild constipation but denies rectal bleeding.  Despite  being on daily Zegerid, she continues with reflux symptoms but denies  dysphagia or any specific hepatobiliary complaints.  I cannot see, in  reviewing her chart, previous CT scans or ultrasound reports.  She  follows a regular diet, denies any specific food intolerances.  She  apparently is status post cholecystectomy since she was last seen.  She  has also had repair of a cystocele and rectocele by Dr. Melba Coon which  seems to have helped her GI complaints greatly.  She has a good appetite  and has gained 13 pounds of weight over the last several years.   PAST MEDICAL HISTORY:  Is somewhat extensive and  revolves around chronic  thyroid dysfunction, chronic anxiety and depression, and essential  hypertension along with degenerative arthritis.  In addition to  cholecystectomy she has had hemorrhoidectomy, hysterectomy and bilateral  oophorectomy, and appendectomy.  In addition to depression, she carries  a diagnosis of chronic anxiety disorder.   MEDICATIONS:  1. Zegerid 40 mg a day.  2. Synthroid 50 mcg a day.  3. Lexapro 5 mg a day.  4. HCTZ 35 mg a day.  5. Hormone replacement patch twice weekly.  6. Nasacort nose spray daily.  7. She also uses Allegra and Singulair p.r.n.   She has a HISTORY OF PENICILLIN, NOVOCAIN, REGLAN, AND Z-PAK ALLERGIES.   FAMILY HISTORY:  Noncontributory.   SOCIAL HISTORY:  1. She is widowed and lives alone.  2. She has a college education.  3. She does not smoke or use ethanol.   REVIEW OF SYSTEMS:  Remarkable for current cough and purulent sputum  production with shortness of breath on exertion.  She denies cardiac  complaints or any genitourinary, endocrine, or new gynecologic problems.   REVIEW OF SYSTEMS:  Otherwise noncontributory.   EXAMINATION:  She is a healthy-appearing white female in no distress,  appearing her stated age.  She is 5 feet 5 inches tall and weighs 186  pounds.  Blood pressure 150/80, pulse was 80 and regular.  I cannot  appreciate stigmata of chronic liver disease or thyromegaly.  CHEST:  Clear, without definite rales or rhonchi.  She appeared to be in  a regular rhythm without murmurs, gallops or rubs.  I could not appreciate hepatosplenomegaly, abdominal masses or  tenderness.  She had a well healed suprapubic abdominal scar.  Bowel  sounds were normal.  PERIPHERAL EXTREMITIES:  Unremarkable.  RECTAL EXAM:  Deferred.   ASSESSMENT:  1. Chronic gastroesophageal reflux disease with a history of possible      recurrent aspiration.  2. Symptoms consistent with acute bronchitis.  3. Irritable bowel syndrome,  constipation predominant.  4. Previous dyskinetic reaction to Reglan.  5. History of PENICILLIN AND Z-PAK ALLERGY.  6. Chronic thyroid dysfunction, on Synthroid.  7. History of chronic anxiety and depression.  8. Status post cholecystectomy, hysterectomy, total abdominal      hysterectomy and appendectomy with a history of chronic adhesions      and chronic lower abdominal pain.   RECOMMENDATIONS:  1. Outpatient endoscopy and colonoscopy at her convenience.  2. Levaquin 500 mg for 3-5 days.  3. Continue other medications as listed above.     Stefanie Rea. Stefanie Motto, MD, Caleen Essex, FAGA  Electronically Signed    DRP/MedQ  DD: 05/08/2007  DT: 05/08/2007  Job #: [id number]   cc:   Delaney Meigs, M.D.

## 2010-10-22 NOTE — H&P (Signed)
NAME:  Stefanie [name], Stefanie [name]                ACCOUNT NO.:  [id number]   MEDICAL RECORD NO.:  [id number]          PATIENT TYPE:  INP   LOCATION:  NA                            FACILITY:  [facility]   PHYSICIAN:  Almedia Balls. Fore, M.D.   DATE OF BIRTH:  1941/02/22   DATE OF ADMISSION:  07/05/2005  DATE OF DISCHARGE:                                HISTORY & PHYSICAL   CHIEF COMPLAINT:  Dropping vagina, rectum.   HISTORY:  The patient is a 70 year old gravida 1, para 1 status post  hysterectomy, bilateral salpingo-oophorectomy for endometriosis in 1983 with  bladder repair at that time who has noted over the past several years that  she had increasing pressure and prolapse of tissue through the vaginal  introitus. She has had several pessaries placed by other physicians and was  unable to retain those. She was seen initially in our office on January 10, 2005 at which time it was noted that she had a severe rectocele and probable  enterocele. The urethrovesical angle was well-supported secondary to prior  possible Burch procedure, but she had a minimal upper bladder cystocele. She  has been observed over the past several months and has been doing Kegel  exercises and attempting weight loss to improve her ability to undergo  surgery. She is admitted at this time for anterior and posterior  colporrhaphy, uterosacral vaginal vault suspension, probable repair of  enterocele. She has been fully counseled as to the nature of this procedure  and the risks involved including risks of anesthesia, injury to bowel,  bladder, blood vessels, ureters, postoperative hemorrhage, infection,  recuperation, possible use of a suprapubic catheter. She fully understands  all these considerations and wishes to proceed on July 05, 2005.   PAST MEDICAL HISTORY/MEDICATIONS:  1.  Hemorrhoidectomy in 1972.  2.  Hysterectomy, bilateral salpingo-oophorectomy and urethral suspension in      1983.  3.  Gallbladder and appendix  removal in 1985.  4.  Breast biopsy was done in 1977, which was benign.  5.  She had bone spurs of both ankles 1990 and 1995.  6.  She has had hypertension for which she takes hydrochlorothiazide.  7.  She is mildly hypothyroid and has been maintained on Synthroid.  8.  She has gastroesophageal reflux disease, for which she takes Protonix.  9.  She is on Allegra for chronic sinus inflammation and allergies.  10. She takes Lexapro 5 milligrams daily for depression.  11. She has been on Vivelle dot 0.05 milligrams for hormone replacement.   ALLERGIES:  1.  REGLAN.  2.  NOVOCAIN.  3.  PENICILLIN.   FAMILY HISTORY:  Aunts with diabetes mellitus, question type.  Father and  mother had cardiovascular disease.   REVIEW OF SYSTEMS:  HEENT:  Negative except for glasses.  CARDIORESPIRATORY:  Hypertension.  GASTROINTESTINAL:  As noted above.  GENITOURINARY:  As noted above.  NEUROMUSCULAR:  Negative, except for the  depression.   PHYSICAL EXAMINATION:  VITAL SIGNS:  Height 5 feet, 4-3/4ths inches.  Weight  189 pounds.  Blood pressure 152/78, pulse 72, respirations  18.  GENERAL:  Well-developed white female in no acute distress.  HEENT: Within normal limits.  NECK:  Supple without masses, adenopathy or bruits.  HEART:  Regular rate and rhythm without murmurs.  LUNGS:  Clear to P&A.  BREASTS:  Without palpable mass.  ABDOMEN:  Soft without masses, nontender.  PELVIC:  External genitalia, Bartholin's urethra, Skene's glands within  normal limits. Vagina reveals prolapse, primarily rectocele, with probable  enterocele. The bladder and urethrovesical angle are well-supported, except  for a minimal upper cystocele.  RECTOVAGINAL:  Confirmatory.  EXTREMITIES:  Within normal limits.  CENTRAL NERVOUS SYSTEM:  Grossly intact.  SKIN:  Without suspicious lesions.   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   DISPOSITION:  As noted above.   Of note is that a Pap smear was  normal in June of 2006.           ______________________________  Almedia Balls. Randell Patient, M.D.     SRF/MEDQ  D:  06/28/2005  T:  06/28/2005  Job:  [id number]

## 2010-10-22 NOTE — Discharge Summary (Signed)
Stefanie [name], Stefanie [name]                ACCOUNT NO.:  [id number]   MEDICAL RECORD NO.:  [id number]          PATIENT TYPE:  INP   LOCATION:  9304                          FACILITY:  [facility]   PHYSICIAN:  Almedia Balls. Fore, M.D.   DATE OF BIRTH:  10-Nov-1940   DATE OF ADMISSION:  07/05/2005  DATE OF DISCHARGE:  07/06/2005                                 DISCHARGE SUMMARY   HISTORY:  The patient is a 70 year old status post hysterectomy and probable  Burch procedure with vaginal prolapse, cystocele, rectocele, probable  enterocele who was admitted, on 05 July 2005, for repairs.  The remainder  of her history and physical examination are as previously dictated.   LABORATORY DATA:  Include preoperative hemoglobin 12.7.  B-MET panel was  normal.  Electrocardiogram showed nonspecific ST-segment changes and  questionable left ventricular hypertrophy.   HOSPITAL COURSE:  The patient taken to operating room, on 05 July 2005,  at which time anterior and posterior colporrhaphy, repair of enterocele,  uterosacral ligament vaginal vault suspension were performed.  The patient  did well postoperatively.  Diet and ambulation were progressed over the  evening of 05 July 2005 and early morning of 06 July 2005.  On the  morning of 06 July 2005, she was afebrile and experiencing no problems,  except for pain which was controlled by oral analgesics.  It was felt that  she could be discharged at this time.   FINAL DIAGNOSES:  1.  Cystocele.  2.  Rectocele.  3.  Enterocele.  4.  Vaginal vault prolapse.   OPERATION:  Anterior and posterior colporrhaphy, repair of enterocele,  uterosacral ligament vaginal Puccio suspension.   No pathology report.   DISPOSITION:  Discharged home to return to the office in two weeks for  followup.  She was instructed to gradually progress her activities and to  limit lifting and driving for two weeks.  She was fully ambulatory, on  regular diet, and in good condition  at the time of discharge.   She was given a prescription for:  1.  Percocet 10/325, #30, to be taken one q.6h. p.r.n. pain.  2.  Doxycycline 100 mg, #12, to be taken one b.i.d.   She will call with any problems.           ______________________________  Almedia Balls Randell Patient, M.D.     SRF/MEDQ  D:  07/06/2005  T:  07/06/2005  Job:  [id number]

## 2010-10-22 NOTE — Op Note (Signed)
NAMEMEISHA, Stefanie [name]                ACCOUNT NO.:  [id number]   MEDICAL RECORD NO.:  [id number]          [name] TYPE:  INP   LOCATION:  9399                          [facility]ILITY[facility]   PHYSICIAN:  Stefanie Balls. Fore, M.D.   DATE OF BIRTH:  11-27-1940   DATE OF PROCEDURE:  07/05/2005  DATE OF DISCHARGE:                                 OPERATIVE REPORT   PREOPERATIVE DIAGNOSES:  1.  Vaginal prolapse.  2.  Mild cystocele.  3.  Moderate to severe rectocele.  4.  Probable enterocele.   POSTOPERATIVE DIAGNOSES:  1.  Vaginal prolapse.  2.  Mild cystocele.  3.  Moderate to severe rectocele.  4.  Definite enterocele.   OPERATION:  1.  Anterior and posterior colporrhaphy.  2.  Repair of enterocele.  3.  Uterosacral-vaginal vault suspension.   ANESTHESIA:  General orotracheal.   OPERATOR:  Stefanie Balls. Randell [name], M.D.   FIRST ASSISTANT:  Artist Pais, M.D.   INDICATION FOR SURGERY:  The [name] is a 70 year old with the above-noted  problems, who was counseled as to the need for surgery to treat these  problems.  She was fully counseled as to the nature of the procedure and the  risks involved, to include risks of anesthesia; injury to bowel, bladder,  blood vessels, ureters; postoperative hemorrhage; infection; recuperation;  possible suprapubic catheter following surgery.  She fully understands all  these considerations and wishes to proceed and has signed informed consent  to proceed on July 05, 2005.   OPERATIVE FINDINGS:  On examination, the vaginal vault was totally  prolapsed.  There was a moderate cystocele present.  The urethrovesical  angle was well-supported from a previous suspension.  There was a severe  rectocele and enterocele present.   PROCEDURE:  With the [name] under general anesthesia, prepared and draped  in the usual sterile fashion, with a Foley catheter in the bladder, Allis  clamps were placed at the mucocutaneous junctions laterally and in the  midline for  dissection of the vaginal mucosa off the underlying fascia.  A  solution of 1% lidocaine with 1:200,000 epinephrine was injected in the  midline of the posterior vagina for a total of 10 mL for delineation of  fascial planes and hemostasis.  The incision was carried in the midline to  the vaginal cuff and onto the area of the cystocele.  Sharp and blunt  dissection were utilized to dissect off the underlying fascia and scarred  tissues.  The enterocele was then reduced by imbricating sutures of 0 PDS in  a pursestring fashion.  Uterosacral ligaments were identified and sutured to  the upper vaginal cuff and to each other for further support of the vaginal  cuff.  Redundant vaginal mucosa was then excised throughout.  The rectocele  was reduced using imbricating sutures of 0 Vicryl in a horizontal mattress  fashion with a vertical reattachment of the levator ani fascia.  The vaginal  cuff was then reapproximated and rendered hemostatic with continuous  interlocking suture of 2-0 Vicryl.  Estimated blood loss 150 mL.  The  [name]  was taken to the recovery room in good condition with clear urine in  the Foley catheter tubing.  She will be placed on 23-hour observation  following surgery.           ______________________________  Stefanie [name], M.D.     SRF/MEDQ  D:  07/05/2005  T:  07/05/2005  Job:  [id number]   cc:   Artist Pais, M.D.  Fax: [phone]

## 2011-05-02 ENCOUNTER — Ambulatory Visit
Admission: RE | Admit: 2011-05-02 | Discharge: 2011-05-02 | Disposition: A | Discharge status: Home / Self Care | Payer: Medicare Other | Source: Ambulatory Visit | Attending: Family Medicine | Admitting: Family Medicine

## 2011-05-02 ENCOUNTER — Other Ambulatory Visit: Payer: Self-pay | Admitting: Family Medicine

## 2011-05-02 DIAGNOSIS — M62838 Other muscle spasm: Secondary | ICD-10-CM

## 2011-05-02 DIAGNOSIS — R52 Pain, unspecified: Secondary | ICD-10-CM

## 2011-05-20 ENCOUNTER — Other Ambulatory Visit: Payer: Self-pay | Admitting: Family Medicine

## 2011-05-20 DIAGNOSIS — M542 Cervicalgia: Secondary | ICD-10-CM

## 2011-05-26 ENCOUNTER — Ambulatory Visit
Admission: RE | Admit: 2011-05-26 | Discharge: 2011-05-26 | Disposition: A | Discharge status: Home / Self Care | Payer: Medicare Other | Source: Ambulatory Visit | Attending: Family Medicine | Admitting: Family Medicine

## 2011-05-26 ENCOUNTER — Other Ambulatory Visit: Payer: Medicare Other

## 2011-05-26 DIAGNOSIS — M542 Cervicalgia: Secondary | ICD-10-CM

## 2011-06-21 ENCOUNTER — Other Ambulatory Visit: Payer: Self-pay | Admitting: Neurosurgery

## 2011-06-21 DIAGNOSIS — M47812 Spondylosis without myelopathy or radiculopathy, cervical region: Secondary | ICD-10-CM

## 2011-06-21 DIAGNOSIS — M5030 Other cervical disc degeneration, unspecified cervical region: Secondary | ICD-10-CM

## 2011-06-21 DIAGNOSIS — M542 Cervicalgia: Secondary | ICD-10-CM

## 2011-06-23 ENCOUNTER — Ambulatory Visit
Admission: RE | Admit: 2011-06-23 | Discharge: 2011-06-23 | Disposition: A | Discharge status: Home / Self Care | Payer: Medicare Other | Source: Ambulatory Visit | Attending: Neurosurgery | Admitting: Neurosurgery

## 2011-06-23 DIAGNOSIS — M5030 Other cervical disc degeneration, unspecified cervical region: Secondary | ICD-10-CM

## 2011-06-23 DIAGNOSIS — M47812 Spondylosis without myelopathy or radiculopathy, cervical region: Secondary | ICD-10-CM

## 2011-06-23 DIAGNOSIS — M542 Cervicalgia: Secondary | ICD-10-CM

## 2011-09-16 ENCOUNTER — Other Ambulatory Visit: Payer: Self-pay | Admitting: Family Medicine

## 2011-09-16 DIAGNOSIS — Z1231 Encounter for screening mammogram for malignant neoplasm of breast: Secondary | ICD-10-CM

## 2011-10-13 ENCOUNTER — Ambulatory Visit
Admission: RE | Admit: 2011-10-13 | Discharge: 2011-10-13 | Disposition: A | Discharge status: Home / Self Care | Payer: Medicare Other | Source: Ambulatory Visit | Attending: Family Medicine | Admitting: Family Medicine

## 2011-10-13 DIAGNOSIS — Z1231 Encounter for screening mammogram for malignant neoplasm of breast: Secondary | ICD-10-CM

## 2012-09-13 ENCOUNTER — Encounter: Payer: Self-pay | Admitting: Gastroenterology

## 2012-09-24 ENCOUNTER — Encounter: Payer: Self-pay | Admitting: *Deleted

## 2012-10-02 ENCOUNTER — Encounter: Payer: Self-pay | Admitting: Gastroenterology

## 2012-10-02 ENCOUNTER — Ambulatory Visit (INDEPENDENT_AMBULATORY_CARE_PROVIDER_SITE_OTHER): Payer: Medicare Other | Admitting: Gastroenterology

## 2012-10-02 VITALS — BP 120/70 | HR 90 | Ht 66.0 in | Wt 187.4 lb

## 2012-10-02 DIAGNOSIS — K219 Gastro-esophageal reflux disease without esophagitis: Secondary | ICD-10-CM

## 2012-10-02 DIAGNOSIS — R079 Chest pain, unspecified: Secondary | ICD-10-CM

## 2012-10-02 MED ORDER — SUCRALFATE 1 GM/10ML PO SUSP: ORAL | Status: DC

## 2012-10-02 MED ORDER — DEXLANSOPRAZOLE 60 MG PO CPDR: 60.0000 mg | DELAYED_RELEASE_CAPSULE | Freq: Every day | ORAL | Status: DC

## 2012-10-02 NOTE — Patient Instructions (Addendum)
You have been scheduled for an endoscopy with propofol. Please follow written instructions given to you at your visit today. If you use inhalers (even only as needed), please bring them with you on the day of your procedure. Your physician has requested that you go to www.startemmi.com and enter the access code given to you at your visit today. This web site gives a general overview about your procedure. However, you should still follow specific instructions given to you by our office regarding your preparation for the procedure.  Please stop Zegerid and start taking Dexilant. Samples were given today. Please call back if this works for a prescription.  Carafate was sent to your pharmacy, please take as directed. _______________________________________________________________________________________________                                               We are excited to introduce MyChart, a new best-in-class service that provides you online access to important information in your electronic medical record. We want to make it easier for you to view your health information - all in one secure location - when and where you need it. We expect MyChart will enhance the quality of care and service we provide.  When you register for MyChart, you can:    View your test results.    Request appointments and receive appointment reminders via email.    Request medication renewals.    View your medical history, allergies, medications and immunizations.    Communicate with your physician's office through a password-protected site.    Conveniently print information such as your medication lists.  To find out if MyChart is right for you, please talk to a member of our clinical staff today. We will gladly answer your questions about this free health and wellness tool.  If you are age 36 or older and want a member of your family to have access to your record, you must provide written consent by completing  a proxy form available at our office. Please speak to our clinical staff about guidelines regarding accounts for patients younger than age 22.  As you activate your MyChart account and need any technical assistance, please call the MyChart technical support line at [phone]-CHART [phone]) or email your question to [email or web address]. If you email your question(s), please include your name, a return phone number and the best time to reach you.  If you have non-urgent health-related questions, you can send a message to our office through MyChart at [email or web address]. If you have a medical emergency, call 911.  Thank you for using MyChart as your new health and wellness resource!   MyChart licensed from [employer],  [id number]. Patents Pending.

## 2012-10-02 NOTE — Progress Notes (Signed)
This is a 72 year old Caucasian female with chronic GERD currently on Zegerid 20 mg a day.  She has rather severe subxiphoid pain radiating into her back without real alleviating or precipitating elements.  She denies true dysphagia, or any hepatobiliary symptoms, and is status post cholecystectomy in 1985.  Last endoscopic exam several years ago showed a large hiatal hernia and severe gastritis but negative H. pylori biopsies.  She denies lower gastrointestinal symptoms at this time.  She also denies abuse of alcohol, cigarettes, or NSAIDs.  Denies systemic complaints cardiovascular symptomatology.  Other medical problems are listed and reviewed.  Also no genitourinary complaints or history of recurrent UTIs.  Current Medications, Allergies, Past Medical History, Past Surgical History, Family History and Social History were reviewed in [employer] record.  ROS: All systems were reviewed and are negative unless otherwise stated in the HPI.          Physical Exam: 2 120/70, pulse 90 and regular and weight 167 with a BMI of 30.26.  Cannot appreciate stigmata of chronic liver disease, thyromegaly or lymphadenopathy.  Chest is clear she is in a regular rhythm without murmurs gallops or rubs.  Her abdomen shows no organomegaly, masses or tenderness.  Bowel sounds are normal.  Peripheral extremities are unremarkable, mental status is normal.    Assessment and Plan: Probable enlarging hiatal hernia which may require surgical correction.  I have set her up for endoscopy ASAP and will change her from Zegerid to Dexilant 60 mg a day with PC and each bedtime liquid Carafate.  The patient relates she's had multiple lab tests by Dr. Lysbeth Galas which have been within normal limits.  She does take daily Philip: Healthy for abdominal gas and bloating.  As per above, she may need high-resolution esophageal manometry and surgical referral.  Standard antireflux regime reviewed with patient.  She is to  continue other medications as listed and reviewed. No diagnosis found.

## 2012-10-03 ENCOUNTER — Ambulatory Visit (AMBULATORY_SURGERY_CENTER): Payer: Medicare Other | Admitting: Gastroenterology

## 2012-10-03 ENCOUNTER — Encounter: Payer: Self-pay | Admitting: Gastroenterology

## 2012-10-03 ENCOUNTER — Other Ambulatory Visit: Payer: Self-pay | Admitting: *Deleted

## 2012-10-03 VITALS — BP 144/69 | HR 70 | Temp 97.7°F | Resp 19 | Ht 66.0 in | Wt 187.0 lb

## 2012-10-03 DIAGNOSIS — K219 Gastro-esophageal reflux disease without esophagitis: Secondary | ICD-10-CM

## 2012-10-03 DIAGNOSIS — K2970 Gastritis, unspecified, without bleeding: Secondary | ICD-10-CM

## 2012-10-03 DIAGNOSIS — K2990 Gastroduodenitis, unspecified, without bleeding: Secondary | ICD-10-CM

## 2012-10-03 DIAGNOSIS — R079 Chest pain, unspecified: Secondary | ICD-10-CM

## 2012-10-03 MED ORDER — DEXLANSOPRAZOLE 60 MG PO CPDR: DELAYED_RELEASE_CAPSULE | ORAL | Status: DC

## 2012-10-03 MED ORDER — SODIUM CHLORIDE 0.9 % IV SOLN: 500.0000 mL | INTRAVENOUS | Status: DC

## 2012-10-03 NOTE — Progress Notes (Signed)
Called to room to assist during endoscopic procedure.  Patient ID and intended procedure confirmed with present staff. Received instructions for my participation in the procedure from the performing physician.  

## 2012-10-03 NOTE — Progress Notes (Signed)
Lidocaine-40mg IV prior to Propofol InductionPropofol given over incremental dosages 

## 2012-10-03 NOTE — Patient Instructions (Signed)

## 2012-10-03 NOTE — Progress Notes (Signed)
Patient did not experience any of the following events: a burn prior to discharge; a fall within the facility; wrong site/side/patient/procedure/implant event; or a hospital transfer or hospital admission upon discharge from the facility. (G8907) Patient did not have preoperative order for IV antibiotic SSI prophylaxis. (G8918)  

## 2012-10-03 NOTE — Op Note (Signed)
[facility] [address] [place], [address]   ENDOSCOPY PROCEDURE REPORT  PATIENT: Stefanie, [name]  MR#: [id number] BIRTHDATE: 1940-10-23 , 72  yrs. old GENDER: Female ENDOSCOPIST:Ruthanne Mcneish Hale Bogus, MD, [facility] REFERRED BY: PROCEDURE DATE:  10/03/2012 PROCEDURE:   EGD w/ biopsy and EGD w/ biopsy for H.pylori ASA CLASS:    Class II INDICATIONS: Chest pain. MEDICATION: propofol (Diprivan) 100mg  IV TOPICAL ANESTHETIC:  DESCRIPTION OF PROCEDURE:   After the risks and benefits of the procedure were explained, informed consent was obtained.  The LB GIF-H180 [id number]  endoscope was introduced through the mouth  and advanced to the    .  The instrument was slowly withdrawn as the mucosa was fully examined.      DUODENUM: The duodenal mucosa showed no abnormalities.   Biopsies done of GE junction and body of esophagus.  STOMACH: There was mild antral gastropathy noted.  ESOPHAGUS: The mucosa of the esophagus appeared normal.  CLO Bx done.  Retroflexed views revealed a large hiatal hernia.    The scope was then withdrawn from the patient and the procedure completed.  COMPLICATIONS: There were no complications.   ENDOSCOPIC IMPRESSION: 1.   The duodenal mucosa showed no abnormalities 2.   There was mild antral gastropathy noted [T2] ...r/o H.pylori 3.   The mucosa of the esophagus appeared normal ..r/o Barrett's mucosa,eosinophilic esophagitis,vs GERD  RECOMMENDATIONS: 1.  Continue current medications 2.  Continue PPI 3.  My office will arrange for you to have an esophageal manometry test performed.  This is an test of you muscle function of your esophagus. 4. Consider surgical HH repair per severe symptoms despite med Rx.   _______________________________ eSigned:  Mardella Layman, MD, [facility] 10/03/2012 3:10 PM   standard discharge

## 2012-10-04 ENCOUNTER — Telehealth: Payer: Self-pay | Admitting: *Deleted

## 2012-10-04 NOTE — Telephone Encounter (Signed)
  Follow up Call-  Call back number 10/03/2012  Post procedure Call Back phone  # [phone]  Permission to leave phone message Yes     Patient questions:  Do you have a fever, pain , or abdominal swelling? no Pain Score  0 *  Have you tolerated food without any problems? yes  Have you been able to return to your normal activities? yes  Do you have any questions about your discharge instructions: Diet   no Medications  yes Follow up visit  no  Do you have questions or concerns about your Care? yes  Pt. States her lips are slightly swollen and her mouth has been unusually dry since endoscopy Yesterday.  Denies any lacertation on lips or oral mucosa.  Advised that swelling will subside, could use some ice.  Also advised to keep hard candy in Mouth and drink good amounts of liquids today.  Pt. Verbalized understanding.  Actions: * If pain score is 4 or above: No action needed, pain <4.

## 2012-10-04 NOTE — Telephone Encounter (Signed)
Mailed pt an appt for EM with time and instructions. Dr Jarold Motto ordered the EM after her EGD on 10/01/12. She may call for questions.

## 2012-10-05 ENCOUNTER — Encounter: Payer: Self-pay | Admitting: Gastroenterology

## 2012-10-10 ENCOUNTER — Encounter: Payer: Self-pay | Admitting: Gastroenterology

## 2012-10-16 ENCOUNTER — Telehealth: Payer: Self-pay | Admitting: Gastroenterology

## 2012-10-16 MED ORDER — SUCRALFATE 1 G PO TABS: ORAL_TABLET | ORAL | Status: DC

## 2012-10-16 NOTE — Telephone Encounter (Signed)
Pt states she lost her instructions for the EM; asked that I EMail it to her. She also cannot afford the Carafate; will order tabs that she can crush and make a slurry. Pt stated understanding.

## 2012-10-22 ENCOUNTER — Ambulatory Visit (HOSPITAL_COMMUNITY)
Admission: RE | Admit: 2012-10-22 | Discharge: 2012-10-22 | Disposition: A | Discharge status: Home / Self Care | Payer: Medicare Other | Source: Ambulatory Visit | Attending: Gastroenterology | Admitting: Gastroenterology

## 2012-10-22 ENCOUNTER — Encounter (HOSPITAL_COMMUNITY)
Admission: RE | Disposition: A | Discharge status: Home / Self Care | Payer: Self-pay | Source: Ambulatory Visit | Attending: Gastroenterology

## 2012-10-22 DIAGNOSIS — R1310 Dysphagia, unspecified: Secondary | ICD-10-CM | POA: Insufficient documentation

## 2012-10-22 DIAGNOSIS — R079 Chest pain, unspecified: Secondary | ICD-10-CM

## 2012-10-22 HISTORY — PX: ESOPHAGEAL MANOMETRY: SHX5429

## 2012-10-22 SURGERY — MANOMETRY, ESOPHAGUS

## 2012-10-22 MED ORDER — LIDOCAINE VISCOUS 2 % MT SOLN
OROMUCOSAL | Status: AC
  Filled 2012-10-22: qty 15

## 2012-10-24 ENCOUNTER — Encounter (HOSPITAL_COMMUNITY): Payer: Self-pay | Admitting: Gastroenterology

## 2012-10-30 ENCOUNTER — Telehealth: Payer: Self-pay | Admitting: *Deleted

## 2012-10-30 NOTE — Telephone Encounter (Signed)
lmom for pt to call back for results

## 2012-10-30 NOTE — Telephone Encounter (Signed)
Informed pt her co pay is $95; there is no cheaper rate/prior auth. Pt stated understanding.

## 2012-10-30 NOTE — Telephone Encounter (Signed)
Message copied by Florene Glen on Tue Oct 30, 2012  8:08 AM ------      Message from: [place], DAVID R      Created: Fri Oct 26, 2012  4:22 PM       Manometry does not show any significant esophageal motility disorder.  She needs to continue her Dexilant indefinitely for GERD.  I will be glad to speak with her in the office if she still having problems, but this exam just reaffirms that she is probably having acid reflux and slow acid clearance.      ----- Message -----         From: Linna Hoff, RN         Sent: 10/26/2012   3:27 PM           To: Mardella Layman, MD            I am placing pt's manometry report in your box; does pt need an appt to discuss the results? thanks       ------

## 2012-10-30 NOTE — Telephone Encounter (Signed)
Prior Auth not needed for Dexilant. Medication is $95 and no prior auth will help lower it because drug is covered under plan at that cost. Unfortunately discount card will not help because she has Medicare

## 2012-10-30 NOTE — Telephone Encounter (Signed)
Informed pt per Dr Jarold Motto, EM shows GERD mainly, continue with Dexilant. Pt asked about continuing Carafate and I explained, if she feels it helps her, continue it/ she states the Dexilant is very expensive and she can't afford it. She has tried Pepcid, Zantac, Prilosec, Protonix, Zegerid and Nexium w/o relief; explained we will try to get Prior Auth for Dexilant since she has failed everything else. Pt stated understanding.

## 2012-11-26 ENCOUNTER — Other Ambulatory Visit: Payer: Self-pay

## 2012-11-26 DIAGNOSIS — Z1231 Encounter for screening mammogram for malignant neoplasm of breast: Secondary | ICD-10-CM

## 2012-12-21 ENCOUNTER — Ambulatory Visit
Admission: RE | Admit: 2012-12-21 | Discharge: 2012-12-21 | Disposition: A | Discharge status: Home / Self Care | Payer: Medicare Other | Source: Ambulatory Visit

## 2012-12-21 DIAGNOSIS — Z1231 Encounter for screening mammogram for malignant neoplasm of breast: Secondary | ICD-10-CM

## 2013-01-07 ENCOUNTER — Other Ambulatory Visit: Payer: Self-pay | Admitting: Family Medicine

## 2013-01-07 ENCOUNTER — Ambulatory Visit
Admission: RE | Admit: 2013-01-07 | Discharge: 2013-01-07 | Disposition: A | Discharge status: Home / Self Care | Payer: Medicare Other | Source: Ambulatory Visit | Attending: Family Medicine | Admitting: Family Medicine

## 2013-01-07 DIAGNOSIS — W19XXXA Unspecified fall, initial encounter: Secondary | ICD-10-CM

## 2013-10-29 ENCOUNTER — Other Ambulatory Visit: Payer: Self-pay

## 2013-10-29 DIAGNOSIS — Z1231 Encounter for screening mammogram for malignant neoplasm of breast: Secondary | ICD-10-CM

## 2013-12-23 ENCOUNTER — Ambulatory Visit
Admission: RE | Admit: 2013-12-23 | Discharge: 2013-12-23 | Disposition: A | Discharge status: Home / Self Care | Payer: Medicare Other | Source: Ambulatory Visit

## 2013-12-23 ENCOUNTER — Encounter (INDEPENDENT_AMBULATORY_CARE_PROVIDER_SITE_OTHER): Payer: Self-pay

## 2013-12-23 DIAGNOSIS — Z1231 Encounter for screening mammogram for malignant neoplasm of breast: Secondary | ICD-10-CM

## 2014-03-04 IMAGING — CR DG FACIAL BONES COMPLETE 3+V
8 series · 8 of 8 positions shown · non-contrast
Comparison: None.

CLINICAL DATA: Fell 1 week ago hitting the right side of the face
with tenderness

FACIAL BONES COMPLETE 3+V

[view not recorded (1 of 8)]
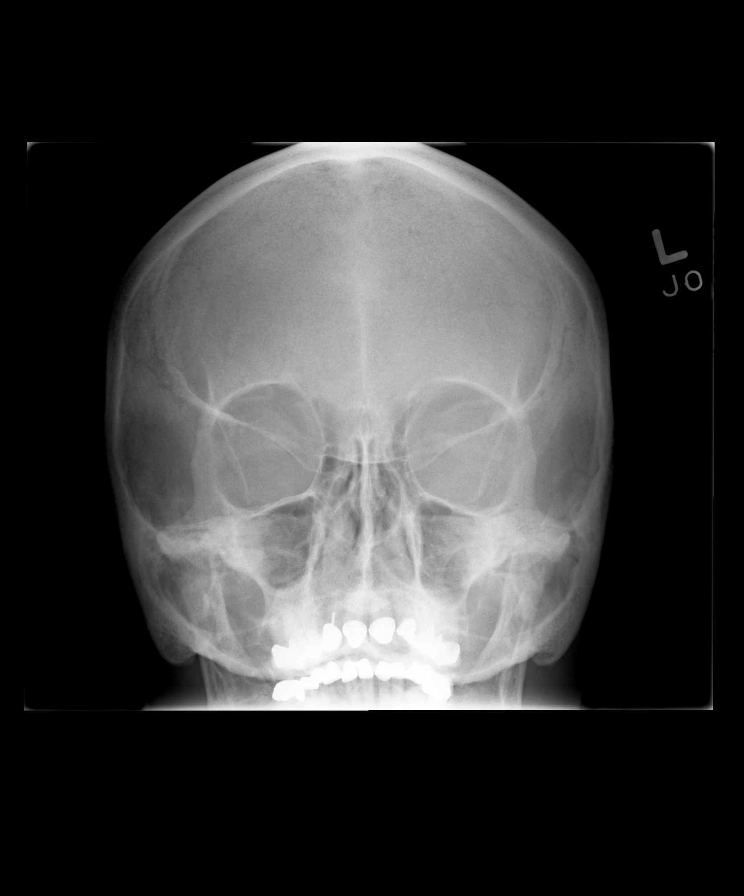

[view not recorded (2 of 8)]
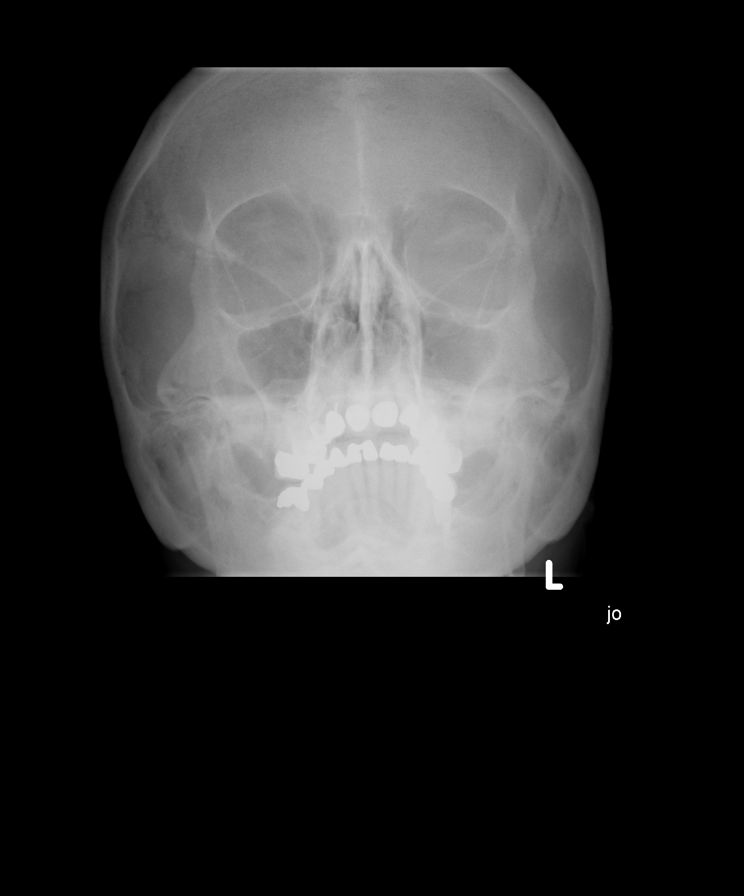

[view not recorded (3 of 8)]
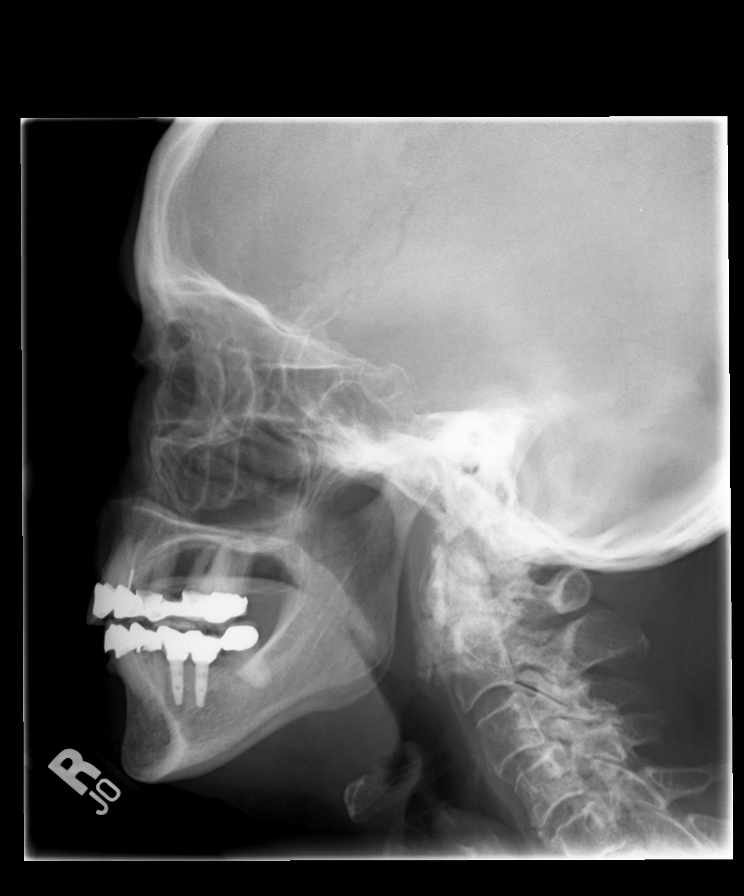

[view not recorded (4 of 8)]
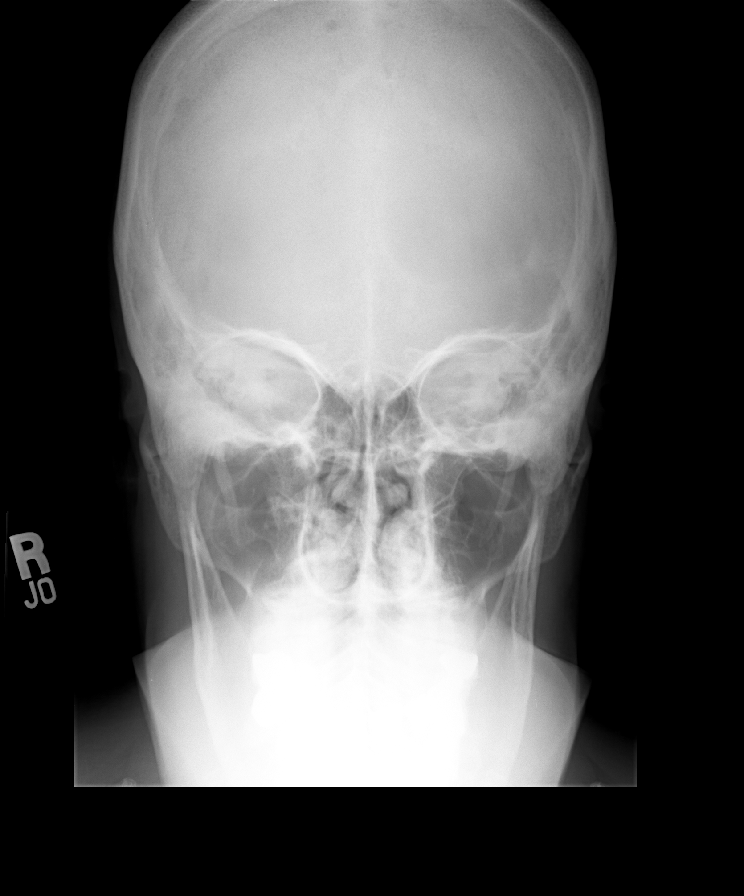

[view not recorded (5 of 8)]
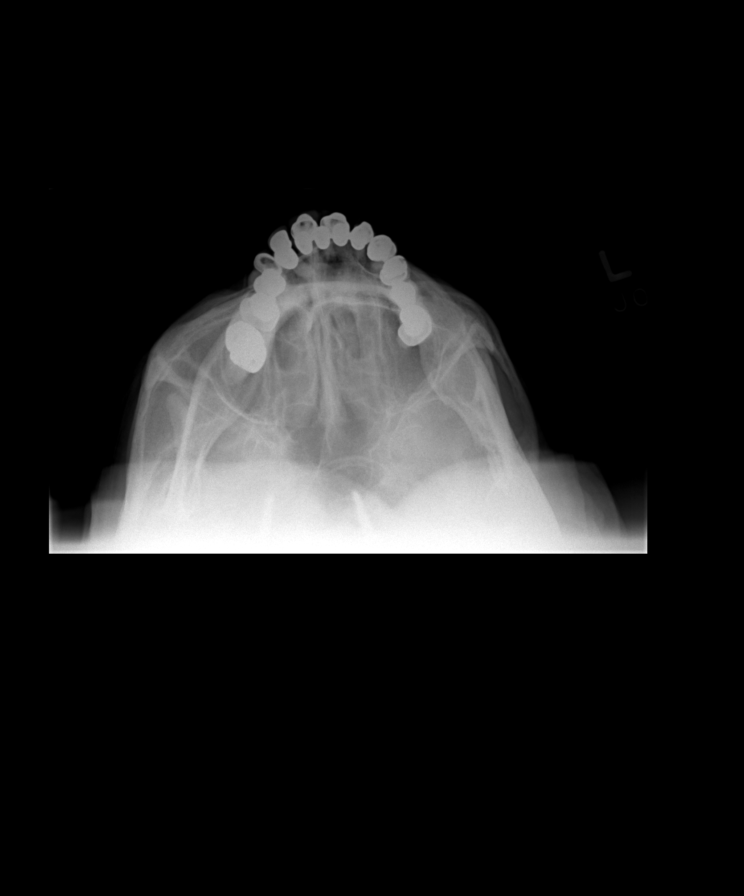

[view not recorded (6 of 8)]
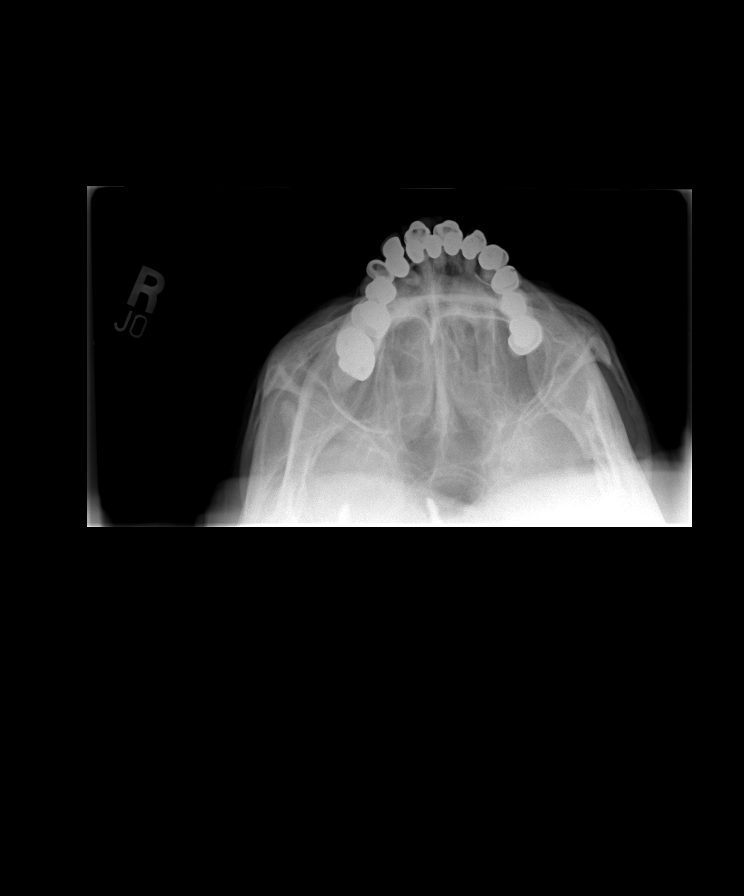

[view not recorded (7 of 8)]
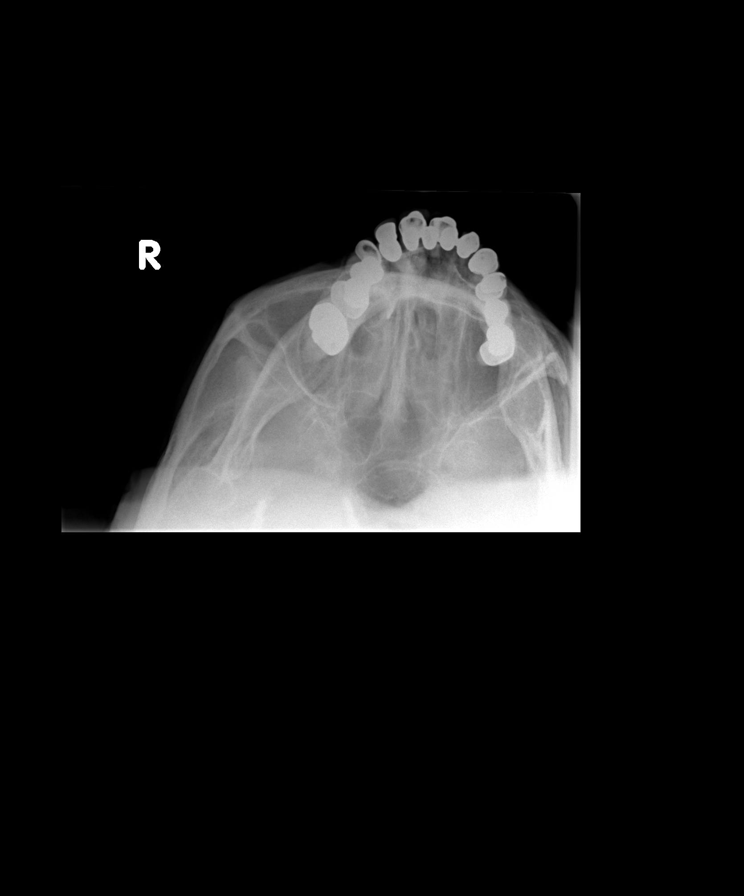

[view not recorded (8 of 8)]
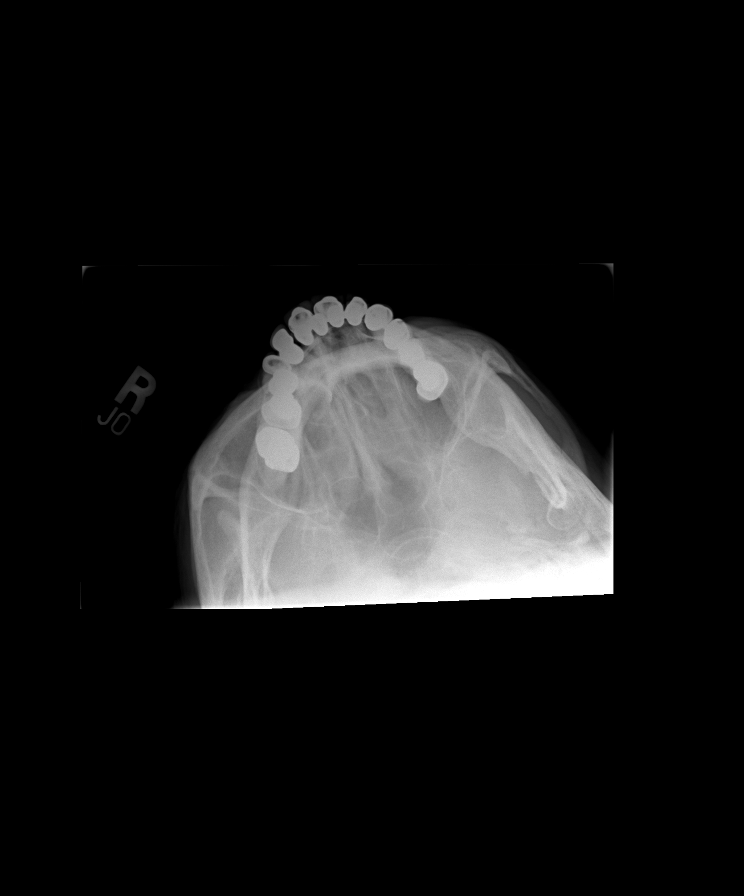

[8 of 8 positions shown; findings below may reference images not displayed]

FINDINGS: No maxillofacial fracture is seen.  No periorbital air is
noted.  The orbital rims appear intact, as are the zygomatic
arches.  Degenerative change is present at the C1-C2 articulation.
IMPRESSION: No fracture.

## 2014-03-04 IMAGING — CR DG MANDIBLE 1-3V
4 series · 4 of 4 positions shown · non-contrast
Comparison: None.

CLINICAL DATA: Fell hitting the right side of face 1 week ago with
tenderness

MANDIBLE - 1-3 VIEW

[view not recorded (1 of 4)]
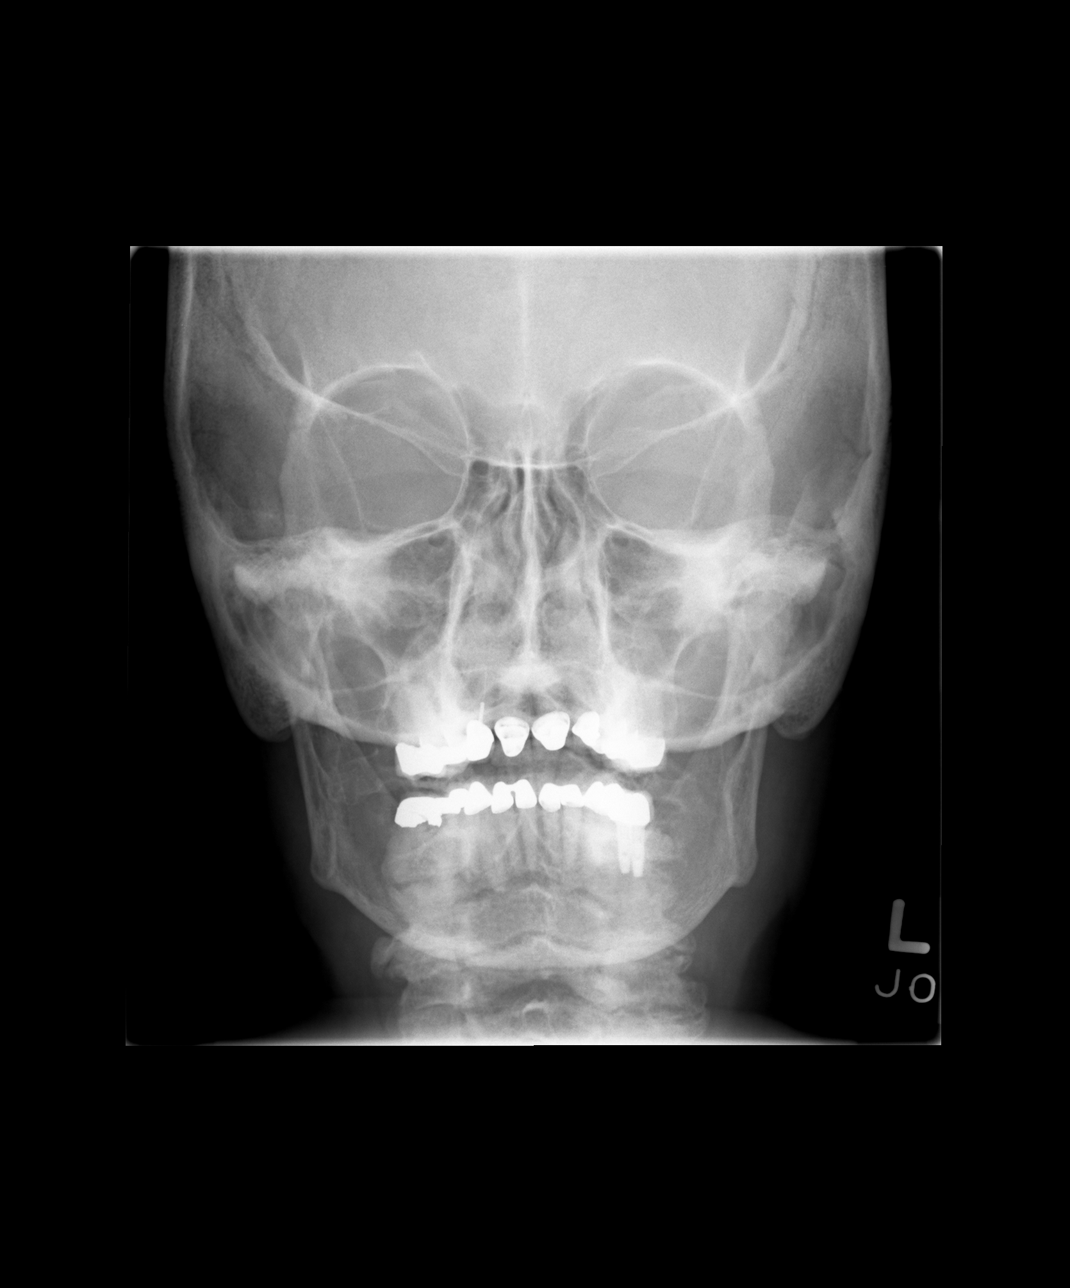

[view not recorded (2 of 4)]
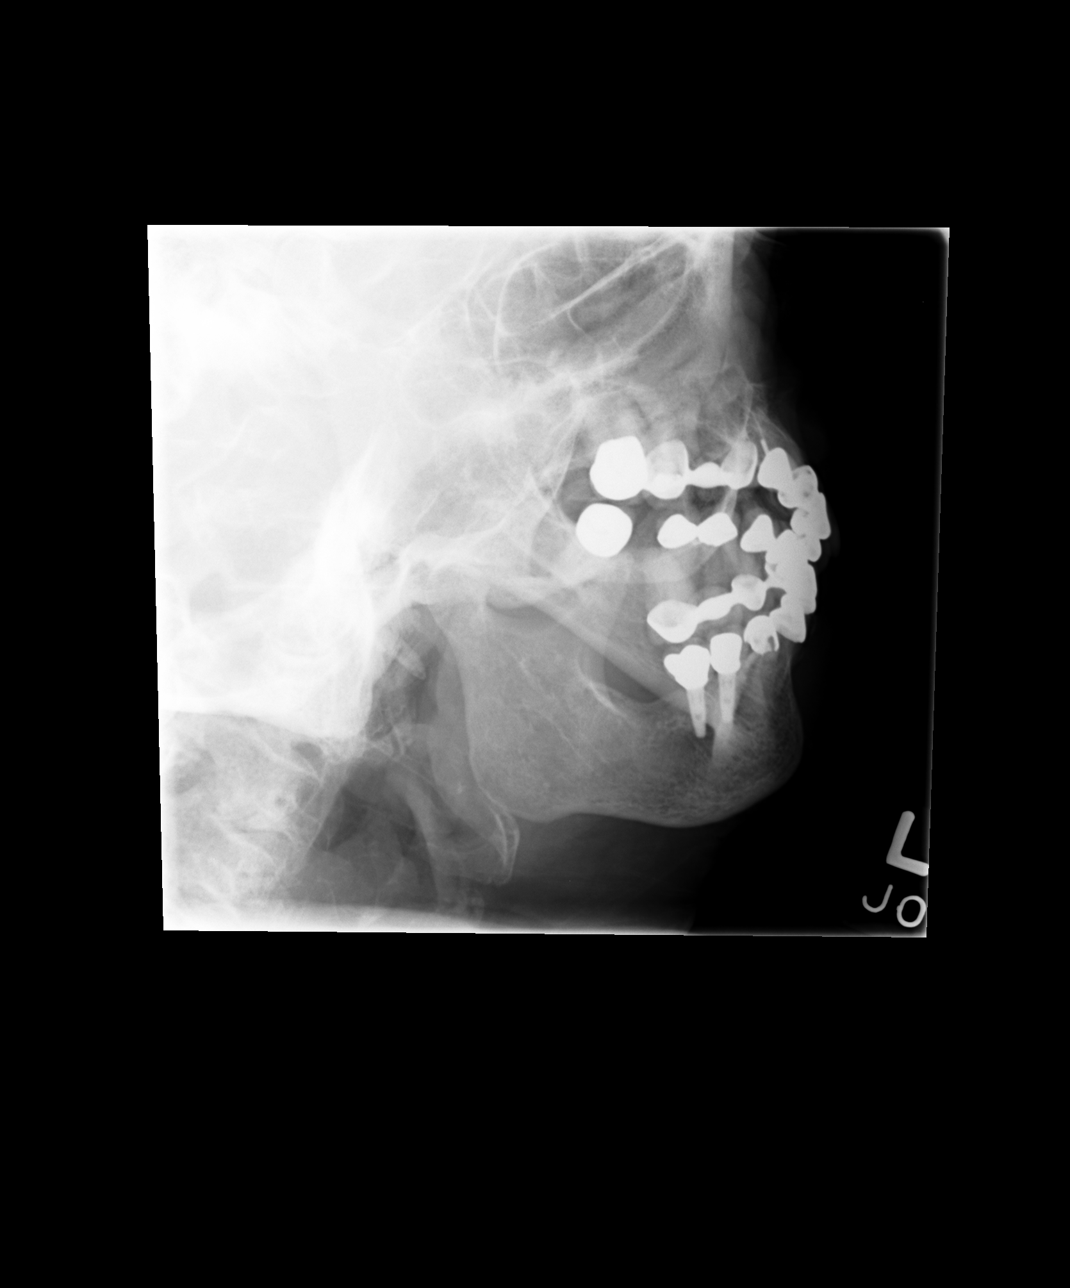

[view not recorded (3 of 4)]
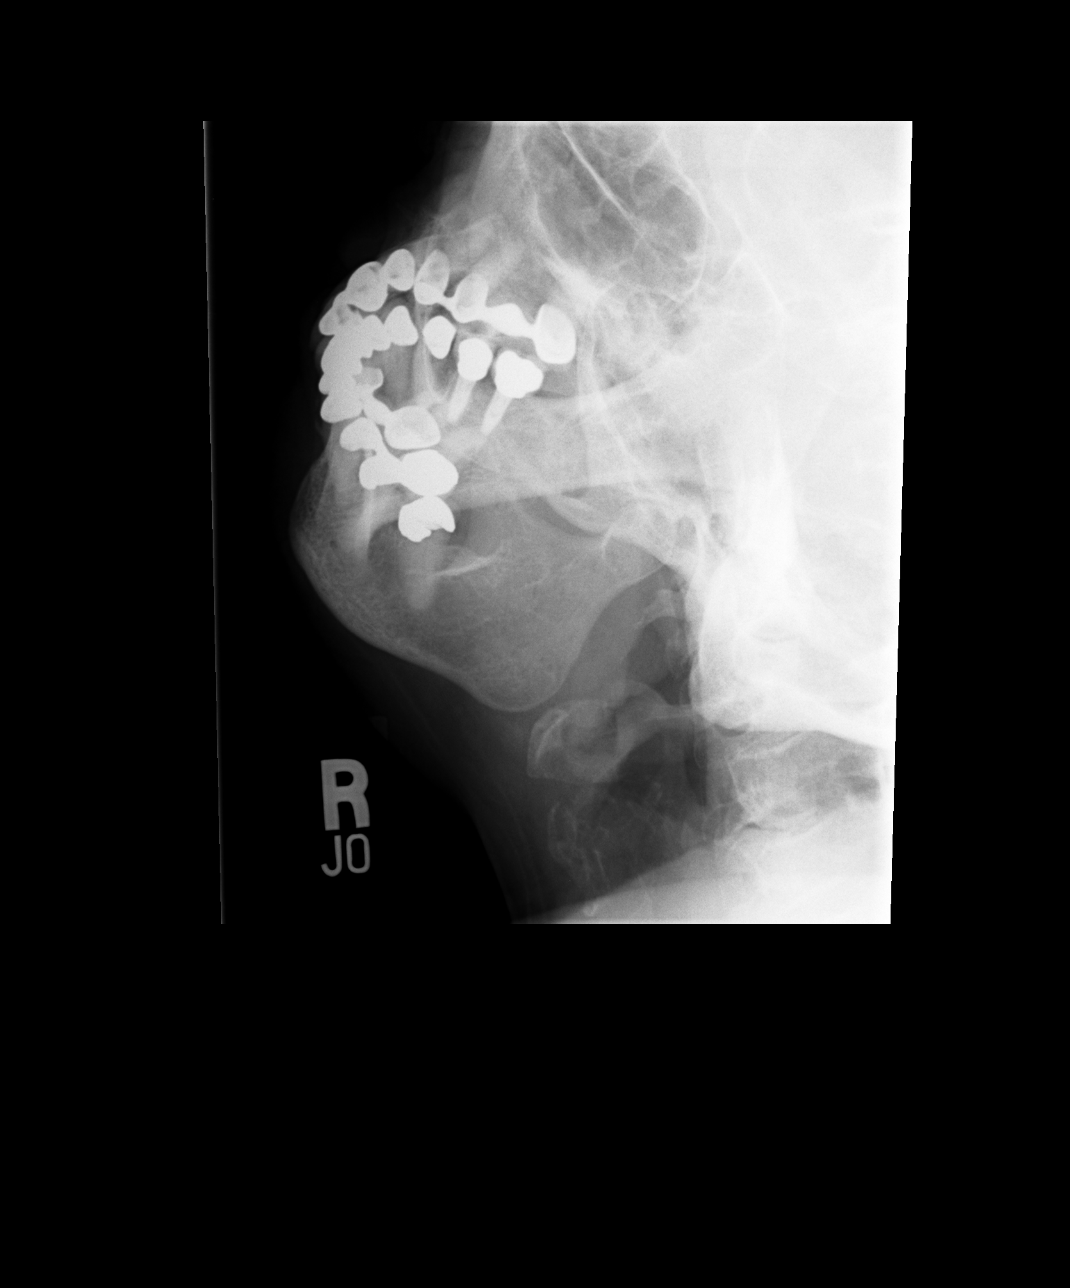

[view not recorded (4 of 4)]
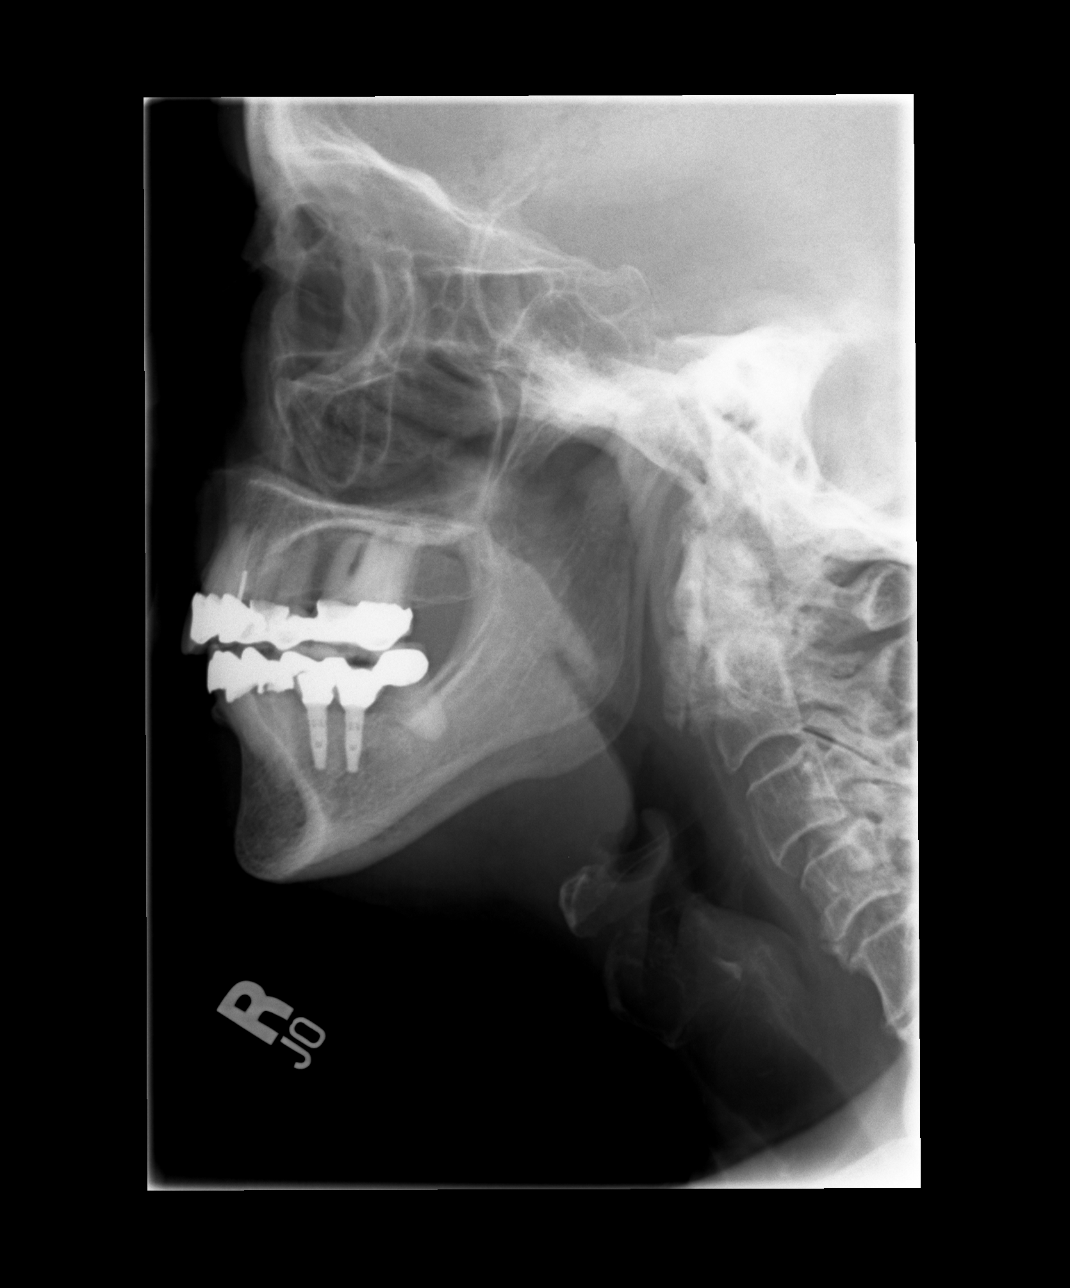

[4 of 4 positions shown; findings below may reference images not displayed]

FINDINGS: No maxillofacial fracture is seen by plain film.  The
mandible appears intact, and the condyles appear to be in normal
position.  The paranasal sinuses are clear.  No periorbital air is
noted.
IMPRESSION: No  maxillofacial fracture is seen.

## 2014-08-26 DIAGNOSIS — J309 Allergic rhinitis, unspecified: Secondary | ICD-10-CM | POA: Insufficient documentation

## 2014-12-18 ENCOUNTER — Other Ambulatory Visit: Payer: Self-pay

## 2014-12-18 DIAGNOSIS — Z1231 Encounter for screening mammogram for malignant neoplasm of breast: Secondary | ICD-10-CM

## 2015-01-22 ENCOUNTER — Other Ambulatory Visit: Payer: Self-pay | Admitting: Family Medicine

## 2015-01-22 ENCOUNTER — Ambulatory Visit: Payer: Self-pay

## 2015-01-22 DIAGNOSIS — M8580 Other specified disorders of bone density and structure, unspecified site: Secondary | ICD-10-CM

## 2015-01-30 ENCOUNTER — Ambulatory Visit
Admission: RE | Admit: 2015-01-30 | Discharge: 2015-01-30 | Disposition: A | Discharge status: Home / Self Care | Payer: Medicare Other | Source: Ambulatory Visit

## 2015-01-30 ENCOUNTER — Ambulatory Visit
Admission: RE | Admit: 2015-01-30 | Discharge: 2015-01-30 | Disposition: A | Discharge status: Home / Self Care | Payer: Medicare Other | Source: Ambulatory Visit | Attending: Family Medicine | Admitting: Family Medicine

## 2015-01-30 DIAGNOSIS — M8580 Other specified disorders of bone density and structure, unspecified site: Secondary | ICD-10-CM

## 2015-01-30 DIAGNOSIS — Z1231 Encounter for screening mammogram for malignant neoplasm of breast: Secondary | ICD-10-CM

## 2015-08-18 ENCOUNTER — Encounter: Payer: Self-pay | Admitting: Internal Medicine

## 2015-08-19 ENCOUNTER — Encounter: Payer: Self-pay | Admitting: *Deleted

## 2015-08-26 ENCOUNTER — Encounter: Payer: Self-pay | Admitting: Internal Medicine

## 2015-08-26 ENCOUNTER — Ambulatory Visit (INDEPENDENT_AMBULATORY_CARE_PROVIDER_SITE_OTHER): Payer: Medicare Other | Admitting: Internal Medicine

## 2015-08-26 ENCOUNTER — Telehealth: Payer: Self-pay | Admitting: *Deleted

## 2015-08-26 VITALS — BP 132/78 | HR 64 | Ht 63.78 in | Wt 188.1 lb

## 2015-08-26 DIAGNOSIS — K589 Irritable bowel syndrome without diarrhea: Secondary | ICD-10-CM

## 2015-08-26 DIAGNOSIS — R198 Other specified symptoms and signs involving the digestive system and abdomen: Secondary | ICD-10-CM

## 2015-08-26 DIAGNOSIS — K5909 Other constipation: Secondary | ICD-10-CM | POA: Diagnosis not present

## 2015-08-26 DIAGNOSIS — R194 Change in bowel habit: Secondary | ICD-10-CM

## 2015-08-26 DIAGNOSIS — R1031 Right lower quadrant pain: Secondary | ICD-10-CM

## 2015-08-26 MED ORDER — NA SULFATE-K SULFATE-MG SULF 17.5-3.13-1.6 GM/177ML PO SOLN: 1.0000 | Freq: Once | ORAL | Status: DC

## 2015-08-26 MED ORDER — LUBIPROSTONE 8 MCG PO CAPS: ORAL_CAPSULE | ORAL | Status: DC

## 2015-08-26 MED ORDER — HYOSCYAMINE SULFATE 0.125 MG SL SUBL: 0.1250 mg | SUBLINGUAL_TABLET | SUBLINGUAL | Status: DC | PRN

## 2015-08-26 NOTE — Telephone Encounter (Signed)
Patient came back to the office and got her new date for colonoscopy to 5/25

## 2015-08-26 NOTE — Addendum Note (Signed)
Addended by: Marlowe KaysSTALLINGS, Letrice Pollok M on: 08/26/2015 09:28 AM   Modules accepted: Orders

## 2015-08-26 NOTE — Progress Notes (Signed)
Patient ID: Stefanie EnsignBeverly Ehlert, female   DOB: [birth year], 75 y.o.   MRN: [id number] HPI: Stefanie [name] is a 75 year old female with a past medical history of GERD with hiatal hernia, IBS, diverticulosis of the colon, hypothyroidism, fibromyalgia and anxiety who seen in consultation at the request of Dr. Lysbeth GalasNyland to evaluate right lower quadrant pain and change in bowel habits. She was previously managed by Dr. Sheryn Bisonavid Patterson prior to his retirement and this is her first visit with me. She comes today to discuss right lower quadrant abdominal pain constipation and slight change in bowel habit. She reports that her stools of been somewhat thin and "stringy". This is been going on for 3-4 weeks. She's also been more constipated despite Benefiber twice daily. She's also tried MiraLAX but she reports this leads to nausea and her bowel movements have "no force". She has not seen blood in her stool or melena. The right lower quadrant pain is all but can be cramping. At times if she is able to have an effective and complete bowel movement the pain seems to get better. She is using a probiotic on most days. She has a history of reflux and she is using Zegerid over-the-counter currently with good control of symptoms. She denies heartburn, dysphagia or odynophagia. She reports a stable weight.  She was evaluated by primary care and had a CT scan of the abdomen and pelvis with IV contrast [place]05/[place]. This was reviewed through care everywhere and unremarkable. It did show colonic diverticulosis without diverticulitis.  Her last colonoscopy was in 2011 which showed moderate diverticulosis in the sigmoid to descending colon with red thickened folds. A 4 mm polyp was removed from the cecum but found to be acellular debris. Five-year recall was recommended.  Family history negative for colorectal cancer and IBD. Her brother has had colonic polyps.  She does have an upcoming trip to [place]  planned in early April for vacation. She wonders if her anticipation and mild anxiety over this trip has contributed to her symptoms currently.  Past Medical History  Diagnosis Date  . Hypothyroidism   . Depression   . GERD (gastroesophageal reflux disease)   . Diverticulosis of colon (without mention of hemorrhage)   . Colon polyps   . Hiatal hernia   . Anxiety   . Arthritis   . Fibromyalgia   . History of IBS   . Hypothyroid     Past Surgical History  Procedure Laterality Date  . Hemorrhoid surgery    . Tubal ligation    . Abdominal hysterectomy    . Appendectomy  1985  . Ankle surgery      bilateral   . Rectocele repair    . Esophageal manometry N/A 10/22/2012    Procedure: ESOPHAGEAL MANOMETRY (EM);  Surgeon: Mardella Laymanavid R Patterson, MD;  Location: [facility] END[facility]COPY;  Service: Endoscopy;  Laterality: N/A;  . Cholecystectomy  1985    Outpatient Prescriptions Prior to Visit  Medication Sig Dispense Refill  . cetirizine (ZYRTEC) 10 MG tablet Take 10 mg by mouth daily.    Marland Kitchen. escitalopram (LEXAPRO) 20 MG tablet Take 20 mg by mouth daily.    . fluticasone (FLONASE) 50 MCG/ACT nasal spray Place 2 sprays into the nose daily.    Marland Kitchen. levothyroxine (SYNTHROID, LEVOTHROID) 75 MCG tablet Take 75 mcg by mouth daily before breakfast.    . Probiotic Product (PHILLIPS COLON HEALTH PO) Take by mouth. Every day    . dexlansoprazole (DEXILANT) 60 MG  capsule Take 1 capsule (60 mg total) by mouth daily. 15 capsule 0  . dexlansoprazole (DEXILANT) 60 MG capsule Take one capsule by mouth 30 minutes prior to breakfast daily. 60 capsule 3  . sucralfate (CARAFATE) 1 G tablet Crush one tablet into a fine powder and mix well with about 30 cc of water. Drink before meals and before bedtime. 120 tablet 1  . sucralfate (CARAFATE) 1 GM/10ML suspension PLEASE TAKE ONE TABLESPOON AT BEFORE MEALS AND AT BEDTIME 420 mL 1   No facility-administered medications prior to visit.    Allergies  Allergen Reactions  .  Azithromycin Rash    REACTION: rash  . Lidocaine Rash  . Metoclopramide Rash  . Sulfa Antibiotics Rash  . Metoclopramide Hcl     REACTION: a drawing of all her muscles  . Penicillins   . Procaine Hcl     Family History  Problem Relation Age of Onset  . Colon polyps Brother     Social History  Substance Use Topics  . Smoking status: Never Smoker   . Smokeless tobacco: Never Used  . Alcohol Use: No    ROS: As per history of present illness, otherwise negative  BP 132/78 mmHg  Pulse 64  Ht 5' 3.78" (1.62 m)  Wt 188 lb 2 oz (85.333 kg)  BMI 32.52 kg/m2 Constitutional: Well-developed and well-nourished. No distress. HEENT: Normocephalic and atraumatic. Oropharynx is clear and moist. No oropharyngeal exudate. Conjunctivae are normal.  No scleral icterus. Neck: Neck supple. Trachea midline. Cardiovascular: Normal rate, regular rhythm and intact distal pulses. No M/R/G Pulmonary/chest: Effort normal and breath sounds normal. No wheezing, rales or rhonchi. Abdominal: Soft, nontender, nondistended. Bowel sounds active throughout. There are no masses palpable. No hepatosplenomegaly. Extremities: no clubbing, cyanosis, or edema Lymphadenopathy: No cervical adenopathy noted. Neurological: Alert and oriented to person place and time. Skin: Skin is warm and dry. No rashes noted. Psychiatric: Normal mood and affect. Behavior is normal.  RELEVANT LABS AND IMAGING CT abd IMPRESSION: No acute right lower quadrant process.               Result Narrative  INDICATION:    R10.31: Right lower quadrant pain  COMPARISON:  None.   TECHNIQUE:  CT ABDOMEN PELVIS W CONTRAST - 80 mL  Isovue 370 were administered intravenously. Dose reduction was utilized (automated exposure control, mA or kV adjustment based on patient size, or iterative image   FINDINGS:   SOLID VISCERA: - Liver: Normal. - Gallbladder: Absent - Pancreas: Normal. - Adrenal glands: Normal. - Spleen: Normal. - Kidneys:  Nonobstructing left renal calculus  GI: - No bowel obstruction. Diverticulosis without diverticulitis. Prior appendectomy. -       PERITONEAL CAVITY/RETROPERITONEUM: - No free fluid. - No pneumoperitoneum. - No lymphadenopathy.  PELVIS: - Prior hysterectomy. Low lying urinary bladder.  VISUALIZED LOWER THORAX: - Hiatal hernia.  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   Patient reports CBC normal by primary care and FOBT negative  ASSESSMENT/PLAN: 75 year old female with a past medical history of GERD with hiatal hernia, IBS, diverticulosis of the colon, hypothyroidism, fibromyalgia and anxiety who seen in Consultation to evaluate right lower quadrant pain and change in bowel habits  1. RLQ pain/IBS/change in bowel habit with constipation -- I am reassured after reviewing the abdominal CT scan. Her symptoms may relate to constipation and be primarily irritable in nature. I'm going to give her prescription for Levsin 0.125 mg to be  used sublingual every 4-6 hours as needed for cramping lower abdominal pain. I'm going to begin Amitiza 8 g twice a day to try to help with constipation and more effective bowel movement. If stools are too loose she is instructed to reduce to once daily. We will discontinue her probiotic for now as she is using this somewhat sporadically and this can worsen bloating symptoms. Continue Benefiber 1 tablespoon twice daily. I've also recommended colonoscopy to rule out inflammatory process and also neoplasm. We discussed the risks, benefits and alternatives to colonoscopy and she is agreeable to proceed. She prefers to proceed with this test when she returns from her vacation to [place]. I instructed her to call with any change in symptoms or issues with the new medications. She voiced understanding  2. Trip to [place] -- prescription provided previously by primary care for Cipro and Flagyl she reports in  case the CT ordered by primary care revealed diverticulitis. Since it did not she did not use the medicine. She will take Cipro with her on her trip and use only should she develop traveler's diarrhea.    GN:FAOZHYQ Herman, Md [address] [place], [place] [address]

## 2015-08-26 NOTE — Telephone Encounter (Signed)
L/M for pt to return  my call  Need to put her on Pyrtles schedule

## 2015-08-26 NOTE — Patient Instructions (Addendum)
You have been scheduled for a colonoscopy. Please follow written instructions given to you at your visit today.  Please pick up your prep supplies at the pharmacy within the next 1-3 days. If you use inhalers (even only as needed), please bring them with you on the day of your procedure. Your physician has requested that you go to www.startemmi.com and enter the access code given to you at your visit today. This web site gives a general overview about your procedure. However, you should still follow specific instructions given to you by our office regarding your preparation for the procedure.  Add Amitiza twice daily Discontinue probiotic Continue Benefiber Levsin 0.125 will be sent to your pharmacy

## 2015-10-23 ENCOUNTER — Encounter: Payer: Medicare Other | Admitting: Gastroenterology

## 2015-10-29 ENCOUNTER — Encounter: Payer: Medicare Other | Admitting: Internal Medicine

## 2015-11-09 ENCOUNTER — Ambulatory Visit (INDEPENDENT_AMBULATORY_CARE_PROVIDER_SITE_OTHER): Payer: Medicare Other | Admitting: Otolaryngology

## 2015-11-09 DIAGNOSIS — J342 Deviated nasal septum: Secondary | ICD-10-CM | POA: Diagnosis not present

## 2015-11-09 DIAGNOSIS — J343 Hypertrophy of nasal turbinates: Secondary | ICD-10-CM

## 2016-01-05 ENCOUNTER — Other Ambulatory Visit: Payer: Self-pay | Admitting: Family Medicine

## 2016-01-05 DIAGNOSIS — Z1231 Encounter for screening mammogram for malignant neoplasm of breast: Secondary | ICD-10-CM

## 2016-02-01 ENCOUNTER — Ambulatory Visit
Admission: RE | Admit: 2016-02-01 | Discharge: 2016-02-01 | Disposition: A | Discharge status: Home / Self Care | Payer: Medicare Other | Source: Ambulatory Visit | Attending: Family Medicine | Admitting: Family Medicine

## 2016-02-01 DIAGNOSIS — Z1231 Encounter for screening mammogram for malignant neoplasm of breast: Secondary | ICD-10-CM

## 2017-03-08 ENCOUNTER — Other Ambulatory Visit: Payer: Self-pay | Admitting: Family Medicine

## 2017-03-08 DIAGNOSIS — Z1231 Encounter for screening mammogram for malignant neoplasm of breast: Secondary | ICD-10-CM

## 2017-03-17 ENCOUNTER — Ambulatory Visit: Payer: Medicare Other

## 2017-03-21 ENCOUNTER — Ambulatory Visit
Admission: RE | Admit: 2017-03-21 | Discharge: 2017-03-21 | Disposition: A | Discharge status: Home / Self Care | Payer: Medicare Other | Source: Ambulatory Visit | Attending: Family Medicine | Admitting: Family Medicine

## 2017-03-21 DIAGNOSIS — Z1231 Encounter for screening mammogram for malignant neoplasm of breast: Secondary | ICD-10-CM

## 2017-03-22 ENCOUNTER — Other Ambulatory Visit: Payer: Self-pay | Admitting: Family Medicine

## 2017-03-22 DIAGNOSIS — R928 Other abnormal and inconclusive findings on diagnostic imaging of breast: Secondary | ICD-10-CM

## 2017-03-27 ENCOUNTER — Ambulatory Visit
Admission: RE | Admit: 2017-03-27 | Discharge: 2017-03-27 | Disposition: A | Discharge status: Home / Self Care | Payer: Medicare Other | Source: Ambulatory Visit | Attending: Family Medicine | Admitting: Family Medicine

## 2017-03-27 ENCOUNTER — Inpatient Hospital Stay
Admission: RE | Admit: 2017-03-27 | Discharge: 2017-03-27 | Disposition: A | Discharge status: Home / Self Care | Payer: Medicare Other | Source: Ambulatory Visit | Attending: Family Medicine | Admitting: Family Medicine

## 2017-03-27 DIAGNOSIS — R928 Other abnormal and inconclusive findings on diagnostic imaging of breast: Secondary | ICD-10-CM

## 2017-08-29 DIAGNOSIS — F3342 Major depressive disorder, recurrent, in full remission: Secondary | ICD-10-CM | POA: Insufficient documentation

## 2018-03-26 ENCOUNTER — Other Ambulatory Visit: Payer: Self-pay | Admitting: Family Medicine

## 2018-03-26 DIAGNOSIS — Z1231 Encounter for screening mammogram for malignant neoplasm of breast: Secondary | ICD-10-CM

## 2018-05-02 ENCOUNTER — Ambulatory Visit
Admission: RE | Admit: 2018-05-02 | Discharge: 2018-05-02 | Disposition: A | Discharge status: Home / Self Care | Payer: Medicare Other | Source: Ambulatory Visit | Attending: Family Medicine | Admitting: Family Medicine

## 2018-05-02 DIAGNOSIS — Z1231 Encounter for screening mammogram for malignant neoplasm of breast: Secondary | ICD-10-CM

## 2018-11-20 ENCOUNTER — Other Ambulatory Visit: Payer: Self-pay | Admitting: Urology

## 2018-11-21 ENCOUNTER — Encounter (HOSPITAL_COMMUNITY): Payer: Self-pay | Admitting: General Practice

## 2018-11-22 ENCOUNTER — Other Ambulatory Visit (HOSPITAL_COMMUNITY)
Admission: RE | Admit: 2018-11-22 | Discharge: 2018-11-22 | Disposition: A | Discharge status: Home / Self Care | Payer: Medicare Other | Source: Ambulatory Visit | Attending: Urology | Admitting: Urology

## 2018-11-22 DIAGNOSIS — Z1159 Encounter for screening for other viral diseases: Secondary | ICD-10-CM | POA: Diagnosis present

## 2018-11-22 LAB — SARS CORONAVIRUS 2 (TAT 6-24 HRS): SARS Coronavirus 2: NEGATIVE

## 2018-11-26 ENCOUNTER — Ambulatory Visit (HOSPITAL_COMMUNITY): Payer: Medicare Other

## 2018-11-26 ENCOUNTER — Other Ambulatory Visit: Payer: Self-pay

## 2018-11-26 ENCOUNTER — Encounter (HOSPITAL_COMMUNITY)
Admission: RE | Disposition: A | Discharge status: Home / Self Care | Payer: Self-pay | Source: Other Acute Inpatient Hospital | Attending: Urology

## 2018-11-26 ENCOUNTER — Ambulatory Visit (HOSPITAL_COMMUNITY)
Admission: RE | Admit: 2018-11-26 | Discharge: 2018-11-26 | Disposition: A | Discharge status: Home / Self Care | Payer: Medicare Other | Source: Other Acute Inpatient Hospital | Attending: Urology | Admitting: Urology

## 2018-11-26 ENCOUNTER — Encounter (HOSPITAL_COMMUNITY): Payer: Self-pay | Admitting: *Deleted

## 2018-11-26 DIAGNOSIS — Z9049 Acquired absence of other specified parts of digestive tract: Secondary | ICD-10-CM | POA: Diagnosis not present

## 2018-11-26 DIAGNOSIS — Z87442 Personal history of urinary calculi: Secondary | ICD-10-CM | POA: Insufficient documentation

## 2018-11-26 DIAGNOSIS — K589 Irritable bowel syndrome without diarrhea: Secondary | ICD-10-CM | POA: Insufficient documentation

## 2018-11-26 DIAGNOSIS — Z90710 Acquired absence of both cervix and uterus: Secondary | ICD-10-CM | POA: Insufficient documentation

## 2018-11-26 DIAGNOSIS — Z881 Allergy status to other antibiotic agents status: Secondary | ICD-10-CM | POA: Insufficient documentation

## 2018-11-26 DIAGNOSIS — Z8371 Family history of colonic polyps: Secondary | ICD-10-CM | POA: Diagnosis not present

## 2018-11-26 DIAGNOSIS — E039 Hypothyroidism, unspecified: Secondary | ICD-10-CM | POA: Insufficient documentation

## 2018-11-26 DIAGNOSIS — K449 Diaphragmatic hernia without obstruction or gangrene: Secondary | ICD-10-CM | POA: Diagnosis not present

## 2018-11-26 DIAGNOSIS — K5730 Diverticulosis of large intestine without perforation or abscess without bleeding: Secondary | ICD-10-CM | POA: Diagnosis not present

## 2018-11-26 DIAGNOSIS — Z888 Allergy status to other drugs, medicaments and biological substances status: Secondary | ICD-10-CM | POA: Diagnosis not present

## 2018-11-26 DIAGNOSIS — M1990 Unspecified osteoarthritis, unspecified site: Secondary | ICD-10-CM | POA: Insufficient documentation

## 2018-11-26 DIAGNOSIS — F419 Anxiety disorder, unspecified: Secondary | ICD-10-CM | POA: Insufficient documentation

## 2018-11-26 DIAGNOSIS — F329 Major depressive disorder, single episode, unspecified: Secondary | ICD-10-CM | POA: Insufficient documentation

## 2018-11-26 DIAGNOSIS — Z882 Allergy status to sulfonamides status: Secondary | ICD-10-CM | POA: Insufficient documentation

## 2018-11-26 DIAGNOSIS — N200 Calculus of kidney: Secondary | ICD-10-CM | POA: Diagnosis present

## 2018-11-26 DIAGNOSIS — Z880 Allergy status to penicillin: Secondary | ICD-10-CM | POA: Insufficient documentation

## 2018-11-26 DIAGNOSIS — K219 Gastro-esophageal reflux disease without esophagitis: Secondary | ICD-10-CM | POA: Diagnosis not present

## 2018-11-26 DIAGNOSIS — M797 Fibromyalgia: Secondary | ICD-10-CM | POA: Diagnosis not present

## 2018-11-26 DIAGNOSIS — N135 Crossing vessel and stricture of ureter without hydronephrosis: Secondary | ICD-10-CM

## 2018-11-26 DIAGNOSIS — Z86010 Personal history of colonic polyps: Secondary | ICD-10-CM | POA: Insufficient documentation

## 2018-11-26 HISTORY — DX: Personal history of urinary calculi: Z87.442

## 2018-11-26 HISTORY — PX: EXTRACORPOREAL SHOCK WAVE LITHOTRIPSY: SHX1557

## 2018-11-26 SURGERY — LITHOTRIPSY, ESWL
Anesthesia: LOCAL | Laterality: Left

## 2018-11-26 MED ORDER — CIPROFLOXACIN HCL 500 MG PO TABS
500.0000 mg | ORAL_TABLET | ORAL | Status: AC
  Administered 2018-11-26: 500 mg via ORAL
  Filled 2018-11-26: qty 1

## 2018-11-26 MED ORDER — DIAZEPAM 5 MG PO TABS
10.0000 mg | ORAL_TABLET | ORAL | Status: AC
  Administered 2018-11-26: 10 mg via ORAL
  Filled 2018-11-26: qty 2

## 2018-11-26 MED ORDER — HYDROCODONE-ACETAMINOPHEN 5-325 MG PO TABS: 1.0000 | ORAL_TABLET | ORAL | 0 refills | Status: AC | PRN

## 2018-11-26 MED ORDER — SODIUM CHLORIDE 0.9 % IV SOLN
INTRAVENOUS | Status: DC
  Administered 2018-11-26: 10:00:00 via INTRAVENOUS

## 2018-11-26 MED ORDER — DIPHENHYDRAMINE HCL 25 MG PO CAPS
25.0000 mg | ORAL_CAPSULE | ORAL | Status: AC
  Administered 2018-11-26: 25 mg via ORAL
  Filled 2018-11-26: qty 1

## 2018-11-26 MED ORDER — TAMSULOSIN HCL 0.4 MG PO CAPS: 0.4000 mg | ORAL_CAPSULE | Freq: Every day | ORAL | 0 refills | Status: DC

## 2018-11-26 SURGICAL SUPPLY — 4 items
COVER SURGICAL LIGHT HANDLE (MISCELLANEOUS) ×2 IMPLANT
COVER WAND RF STERILE (DRAPES) IMPLANT
KIT TURNOVER KIT A (KITS) IMPLANT
TOWEL OR 17X26 10 PK STRL BLUE (TOWEL DISPOSABLE) ×2 IMPLANT

## 2018-11-26 NOTE — H&P (Signed)
H&P  Chief Complaint: Left sided kidney stone  History of Present Illness: Stefanie [name] is a 78 y.o. year old female presenting for ESL of a left UPJ stone, 12x6 mm. SSD 11 cm, HU~650.  Past Medical History:  Diagnosis Date  . Anxiety   . Arthritis   . Colon polyps   . Depression   . Diverticulosis of colon (without mention of hemorrhage)   . Fibromyalgia   . GERD (gastroesophageal reflux disease)   . Hiatal hernia   . History of IBS   . History of kidney stones   . Hypothyroid   . Hypothyroidism     Past Surgical History:  Procedure Laterality Date  . ABDOMINAL HYSTERECTOMY    . ANKLE SURGERY     bilateral   . APPENDECTOMY  1985  . BREAST BIOPSY Left    Benign   . CHOLECYSTECTOMY  1985  . ESOPHAGEAL MANOMETRY N/A 10/22/2012   Procedure: ESOPHAGEAL MANOMETRY (EM);  Surgeon: Sable Feil, MD;  Location: [facility];  Service: Endoscopy;  Laterality: N/A;  . HEMORRHOID SURGERY    . RECTOCELE REPAIR    . TUBAL LIGATION      Home Medications:  No medications prior to admission.    Allergies:  Allergies  Allergen Reactions  . Lidocaine Rash  . Metoclopramide Rash  . Sulfa Antibiotics Rash  . Z-Pak [Azithromycin] Rash    REACTION: rash  . Metoclopramide Hcl     REACTION: a drawing of all her muscles  . Penicillins   . Procaine Hcl   . Macrodantin [Nitrofurantoin Macrocrystal] Rash    Family History  Problem Relation Age of Onset  . Colon polyps Brother   . Breast cancer Neg Hx     Social History:  reports that she has never smoked. She has never used smokeless tobacco. She reports that she does not drink alcohol or use drugs.  ROS: A complete review of systems was performed.  All systems are negative except for pertinent findings as noted.  Physical Exam:  Vital signs in last 24 hours: BP: ()/()  Arterial Line BP: ()/()  General:  Alert and oriented, No acute distress HEENT: Normocephalic, atraumatic Neck: No JVD or  lymphadenopathy Cardiovascular: Regular rate and rhythm Lungs: Clear bilaterally Abdomen: Soft, nontender, nondistended, no abdominal masses Back: No CVA tenderness Extremities: No edema Neurologic: Grossly intact  Laboratory Data:  No results found for this or any previous visit (from the past 24 hour(s)). Recent Results (from the past 240 hour(s))  SARS Coronavirus 2 (Performed in [place] hospital lab)     Status: None   Collection Time: 11/22/18  9:59 AM   Specimen: Nasal Swab  Result Value Ref Range Status   SARS Coronavirus 2 NEGATIVE NEGATIVE Final    Comment: (NOTE) SARS-CoV-2 target nucleic acids are NOT DETECTED. The SARS-CoV-2 RNA is generally detectable in upper and lower respiratory specimens during the acute phase of infection. Negative results do not preclude SARS-CoV-2 infection, do not rule out co-infections with other pathogens, and should not be used as the sole basis for treatment or other patient management decisions. Negative results must be combined with clinical observations, patient history, and epidemiological information. The expected result is Negative. Fact Sheet for Patients: TrashEliminator.se Fact Sheet for Healthcare Providers: WhoisBlogging.ch This test is not yet approved or cleared by the [place] FDA and  has been authorized for detection and/or diagnosis of SARS-CoV-2 by FDA under an Emergency Use Authorization (EUA). This EUA will remain  in effect (meaning this test can be used) for the duration of the COVID-19 declaration under Section 56 4(b)(1) of the Act, 21 U.S.C. section 360bbb-3(b)(1), unless the authorization is terminated or revoked sooner. Performed at [facility], [address] [place], [place] [id number]    Creatinine: No results for input(s): CREATININE in the last 168 hours.  Radiologic Imaging: No results found.  Impression/Assessment:  12x6 mm left  UPJ stone  Plan:  ESL. This has been discussed with the pt, as well as risks/complications as well as expected outcomes, as well as the fact that this will possibly be part of a staged procedure.  Bertram MillardStephen M Anush Wiedeman 11/26/2018, 6:43 AM  Bertram MillardStephen M. Aanyah Loa MD

## 2018-11-26 NOTE — Discharge Instructions (Signed)
Lithotripsy, Care After °This sheet gives you information about how to care for yourself after your procedure. Your health care provider may also give you more specific instructions. If you have problems or questions, contact your health care provider. °What can I expect after the procedure? °After the procedure, it is common to have: °· Some blood in your urine. This should only last for a few days. °· Soreness in your back, sides, or upper abdomen for a few days. °· Blotches or bruises on your back where the pressure wave entered the skin. °· Pain, discomfort, or nausea when pieces (fragments) of the kidney stone move through the tube that carries urine from the kidney to the bladder (ureter). Stone fragments may pass soon after the procedure, but they may continue to pass for up to 4-8 weeks. °? If you have severe pain or nausea, contact your health care provider. This may be caused by a large stone that was not broken up, and this may mean that you need more treatment. °· Some pain or discomfort during urination. °· Some pain or discomfort in the lower abdomen or (in men) at the base of the penis. °Follow these instructions at home: °Medicines °· Take over-the-counter and prescription medicines only as told by your health care provider. °· If you were prescribed an antibiotic medicine, take it as told by your health care provider. Do not stop taking the antibiotic even if you start to feel better. °· Do not drive for 24 hours if you were given a medicine to help you relax (sedative). °· Do not drive or use heavy machinery while taking prescription pain medicine. °Eating and drinking ° °  ° °· Drink enough water and fluids to keep your urine clear or pale yellow. This helps any remaining pieces of the stone to pass. It can also help prevent new stones from forming. °· Eat plenty of fresh fruits and vegetables. °· Follow instructions from your health care provider about eating and drinking restrictions. You may be  instructed: °? To reduce how much salt (sodium) you eat or drink. Check ingredients and nutrition facts on packaged foods and beverages. °? To reduce how much meat you eat. °· Eat the recommended amount of calcium for your age and gender. Ask your health care provider how much calcium you should have. °General instructions °· Get plenty of rest. °· Most people can resume normal activities 1-2 days after the procedure. Ask your health care provider what activities are safe for you. °· Your health care provider may direct you to lie in a certain position (postural drainage) and tap firmly (percuss) over your kidney area to help stone fragments pass. Follow instructions as told by your health care provider. °· If directed, strain all urine through the strainer that was provided by your health care provider. °? Keep all fragments for your health care provider to see. Any stones that are found may be sent to a medical lab for examination. The stone may be as small as a grain of salt. °· Keep all follow-up visits as told by your health care provider. This is important. °Contact a health care provider if: °· You have pain that is severe or does not get better with medicine. °· You have nausea that is severe or does not go away. °· You have blood in your urine longer than your health care provider told you to expect. °· You have more blood in your urine. °· You have pain during urination that does   not go away.  You urinate more frequently than usual and this does not go away.  You develop a rash or any other possible signs of an allergic reaction. Get help right away if:  You have severe pain in your back, sides, or upper abdomen.  You have severe pain while urinating.  Your urine is very dark red.  You have blood in your stool (feces).  You cannot pass any urine at all.  You feel a strong urge to urinate after emptying your bladder.  You have a fever or chills.  You develop shortness of breath,  difficulty breathing, or chest pain.  You have severe nausea that leads to persistent vomiting.  You faint. Summary  After this procedure, it is common to have some pain, discomfort, or nausea when pieces (fragments) of the kidney stone move through the tube that carries urine from the kidney to the bladder (ureter). If this pain or nausea is severe, however, you should contact your health care provider.  Most people can resume normal activities 1-2 days after the procedure. Ask your health care provider what activities are safe for you.  Drink enough water and fluids to keep your urine clear or pale yellow. This helps any remaining pieces of the stone to pass, and it can help prevent new stones from forming.  If directed, strain your urine and keep all fragments for your health care provider to see. Fragments or stones may be as small as a grain of salt.  Get help right away if you have severe pain in your back, sides, or upper abdomen or have severe pain while urinating. This information is not intended to replace advice given to you by your health care provider. Make sure you discuss any questions you have with your health care provider. Document Released: 06/12/2007 Document Revised: 11/01/2017 Document Reviewed: 04/13/2016 Elsevier Interactive Patient Education  2019 [place] discharge instructions in chart.

## 2018-11-26 NOTE — Op Note (Signed)
See [facility] OP note scanned into chart. 

## 2018-11-27 ENCOUNTER — Encounter (HOSPITAL_COMMUNITY): Payer: Self-pay | Admitting: Urology

## 2018-12-15 DIAGNOSIS — Z9189 Other specified personal risk factors, not elsewhere classified: Secondary | ICD-10-CM | POA: Insufficient documentation

## 2019-07-22 DIAGNOSIS — M25472 Effusion, left ankle: Secondary | ICD-10-CM | POA: Insufficient documentation

## 2021-08-15 ENCOUNTER — Encounter (HOSPITAL_COMMUNITY): Payer: Self-pay | Admitting: Emergency Medicine

## 2021-08-15 ENCOUNTER — Emergency Department (HOSPITAL_COMMUNITY): Payer: Medicare Other

## 2021-08-15 ENCOUNTER — Other Ambulatory Visit: Payer: Self-pay

## 2021-08-15 ENCOUNTER — Emergency Department (HOSPITAL_COMMUNITY)
Admission: EM | Admit: 2021-08-15 | Discharge: 2021-08-15 | Disposition: A | Discharge status: Home / Self Care | Payer: Medicare Other | Attending: Emergency Medicine | Admitting: Emergency Medicine

## 2021-08-15 DIAGNOSIS — W01198A Fall on same level from slipping, tripping and stumbling with subsequent striking against other object, initial encounter: Secondary | ICD-10-CM | POA: Insufficient documentation

## 2021-08-15 DIAGNOSIS — S0990XA Unspecified injury of head, initial encounter: Secondary | ICD-10-CM | POA: Diagnosis present

## 2021-08-15 DIAGNOSIS — S7001XA Contusion of right hip, initial encounter: Secondary | ICD-10-CM | POA: Insufficient documentation

## 2021-08-15 DIAGNOSIS — E039 Hypothyroidism, unspecified: Secondary | ICD-10-CM | POA: Diagnosis not present

## 2021-08-15 DIAGNOSIS — S50311A Abrasion of right elbow, initial encounter: Secondary | ICD-10-CM | POA: Diagnosis not present

## 2021-08-15 DIAGNOSIS — S0101XA Laceration without foreign body of scalp, initial encounter: Secondary | ICD-10-CM | POA: Diagnosis not present

## 2021-08-15 DIAGNOSIS — W19XXXA Unspecified fall, initial encounter: Secondary | ICD-10-CM

## 2021-08-15 MED ORDER — LIDOCAINE-EPINEPHRINE (PF) 2 %-1:200000 IJ SOLN
10.0000 mL | Freq: Once | INTRAMUSCULAR | Status: DC
  Filled 2021-08-15: qty 20

## 2021-08-15 MED ORDER — ACETAMINOPHEN 500 MG PO TABS
1000.0000 mg | ORAL_TABLET | Freq: Once | ORAL | Status: AC
Start: 2021-08-15 — End: 2021-08-15
  Administered 2021-08-15: 1000 mg via ORAL
  Filled 2021-08-15: qty 2

## 2021-08-15 NOTE — ED Notes (Signed)
A&O x 4. Neuro check wnl. No signs of distress. Dressing applied to head and and right elbow. No active bleeding.  ?

## 2021-08-15 NOTE — ED Triage Notes (Signed)
Pt presents today with laceration to scalp after a fall. She was trying to put on her pants and fell and hit her head on furniture. Also c/o right arm and rib pain. Denies LOC ?

## 2021-08-15 NOTE — ED Provider Notes (Signed)
[facility] EMERGENCY DEPARTMENT Provider Note   CSN: [id number] Arrival date & time: 08/15/21  1230     History  Chief Complaint  Patient presents with   Fall   Laceration    Stefanie [name] is a 81 y.o. female.  Pt is 81 yo female with a hx of hypothyroidism, depression, gerd, fibromyalgia, dvt, and gi bleed.  She was on blood thinners, but was taken off in December after a GI bleed.  Pt lost her balance this morning and hit her head.  She has right elbow pain and right rib/hip pain as well.  No loc.      Home Medications Prior to Admission medications   Medication Sig Start Date End Date Taking? Authorizing Provider  cetirizine (ZYRTEC) 10 MG tablet Take 10 mg by mouth daily.    [provider]  escitalopram (LEXAPRO) 20 MG tablet Take 10 mg by mouth daily.     [provider]  fluticasone (FLONASE) 50 MCG/ACT nasal spray Place 2 sprays into the nose daily.    [provider]  hyoscyamine (LEVSIN SL) 0.125 MG SL tablet Place 1 tablet (0.125 mg total) under the tongue every 4 (four) hours as needed. 08/26/15   Pyrtle, Lajuan Lines, MD  levothyroxine (SYNTHROID, LEVOTHROID) 75 MCG tablet Take 75 mcg by mouth daily before breakfast.    [provider]  lubiprostone (AMITIZA) 8 MCG capsule 1 tab every 4-6 hpurs as needed for RLQ pain 08/26/15   Pyrtle, Lajuan Lines, MD  meloxicam (MOBIC) 7.5 MG tablet Take 7.5 mg by mouth as needed for pain.    [provider]  Na Sulfate-K Sulfate-Mg Sulf (SUPREP BOWEL PREP) SOLN Take 1 kit by mouth once. 08/26/15   Pyrtle, Lajuan Lines, MD  omeprazole-sodium bicarbonate (ZEGERID) 40-1100 MG capsule Take 1 capsule by mouth daily before breakfast. 08/26/15   Pyrtle, Lajuan Lines, MD  Probiotic Product ([place]) Take by mouth. Every day    [provider]  tamsulosin (FLOMAX) 0.4 MG CAPS capsule Take 1 capsule (0.4 mg total) by mouth daily after supper. 11/26/18   Franchot Gallo, MD  Vitamin D, Ergocalciferol,  (DRISDOL) 1.25 MG (50000 UT) CAPS capsule Take 50,000 Units by mouth once a week.    [provider]      Allergies    Lidocaine, Metoclopramide, Other, Sulfa antibiotics, Z-pak [azithromycin], Metoclopramide hcl, Penicillins, Procaine hcl, and Macrodantin [nitrofurantoin macrocrystal]    Review of Systems   Review of Systems  Musculoskeletal:        Right elbow, right rib, right hip pain  Skin:  Positive for wound.  All other systems reviewed and are negative.  Physical Exam Updated Vital Signs BP (!) 156/72    Pulse 78    Temp 97.9 F (36.6 C) (Oral)    Resp 18    Ht 5' 4" (1.626 m)    Wt 63.5 kg    SpO2 95%    BMI 24.03 kg/m  Physical Exam Vitals and nursing note reviewed.  HENT:     Head: Normocephalic.     Comments: Large lac to right parietal scalp    Right Ear: External ear normal.     Left Ear: External ear normal.     Nose: Nose normal.     Mouth/Throat:     Mouth: Mucous membranes are moist.     Pharynx: Oropharynx is clear.  Eyes:     Extraocular Movements: Extraocular movements intact.     Conjunctiva/sclera:  Conjunctivae normal.     Pupils: Pupils are equal, round, and reactive to light.  Cardiovascular:     Rate and Rhythm: Normal rate and regular rhythm.     Pulses: Normal pulses.     Heart sounds: Normal heart sounds.  Pulmonary:     Effort: Pulmonary effort is normal.     Breath sounds: Normal breath sounds.  Chest:    Abdominal:     General: Abdomen is flat. Bowel sounds are normal.     Palpations: Abdomen is soft.  Musculoskeletal:        General: Normal range of motion.     Right elbow: Normal range of motion. Tenderness present.     Cervical back: Normal range of motion and neck supple.       Legs:  Skin:    General: Skin is warm.     Capillary Refill: Capillary refill takes less than 2 seconds.  Neurological:     General: No focal deficit present.     Mental Status: She is alert and oriented to person, place, and time.   Psychiatric:        Mood and Affect: Mood normal.        Behavior: Behavior normal.    ED Results / Procedures / Treatments   Labs (all labs ordered are listed, but only abnormal results are displayed) Labs Reviewed - No data to display  EKG None  Radiology DG Chest 2 View  Result Date: 08/15/2021 CLINICAL DATA:  Fall EXAM: CHEST - 2 VIEW COMPARISON:  None FINDINGS: Upper normal size of cardiac silhouette. Mediastinal contours and pulmonary vascularity normal. Lungs clear. No acute infiltrate, pleural effusion, or pneumothorax. Diffuse osseous demineralization. IMPRESSION: No acute abnormalities. Electronically Signed   By: Lavonia Dana M.D.   On: 08/15/2021 13:28   DG Elbow Complete Right  Result Date: 08/15/2021 CLINICAL DATA:  RIGHT elbow pain post fall EXAM: RIGHT ELBOW - COMPLETE 3+ VIEW COMPARISON:  None FINDINGS: Osseous demineralization. Mild narrowing of radiocapitellar joint. No acute fracture, dislocation, or bone destruction. No joint effusion. IMPRESSION: Osseous demineralization with mild degenerative changes RIGHT elbow. No acute bony abnormalities. Electronically Signed   By: Lavonia Dana M.D.   On: 08/15/2021 13:26   CT Head Wo Contrast  Result Date: 08/15/2021 CLINICAL DATA:  Head trauma, fall, scalp laceration EXAM: CT HEAD WITHOUT CONTRAST CT MAXILLOFACIAL WITHOUT CONTRAST CT CERVICAL SPINE WITHOUT CONTRAST TECHNIQUE: Multidetector CT imaging of the head, cervical spine, and maxillofacial structures were performed using the standard protocol without intravenous contrast. Multiplanar CT image reconstructions of the cervical spine and maxillofacial structures were also generated. RADIATION DOSE REDUCTION: This exam was performed according to the departmental dose-optimization program which includes automated exposure control, adjustment of the mA and/or kV according to patient size and/or use of iterative reconstruction technique. COMPARISON:  05/10/2021 FINDINGS: CT HEAD  FINDINGS Brain: No evidence of acute infarction, hemorrhage, hydrocephalus, extra-axial collection or mass lesion/mass effect. Periventricular and deep white matter hypodensity. Vascular: No hyperdense vessel or unexpected calcification. CT FACIAL BONES FINDINGS Skull: Normal. Negative for fracture or focal lesion. Facial bones: No displaced fractures or dislocations. Sinuses/Orbits: No acute finding. Other: Soft tissue laceration of the high right parietal scalp (series 3, image 9). CT CERVICAL SPINE FINDINGS Alignment: Normal. Skull base and vertebrae: No acute fracture. No primary bone lesion or focal pathologic process. Soft tissues and spinal canal: No prevertebral fluid or swelling. No visible canal hematoma. Disc levels: Generally moderate multilevel disc space height loss and  with ankylosis at multiple levels including C1-C2 and C4-C7. Upper chest: Negative. Other: None. IMPRESSION: 1. No acute intracranial pathology. Small-vessel white matter disease. 2. No displaced fracture or dislocation of the facial bones. 3. Soft tissue laceration of the high right parietal scalp. 4. No fracture or static subluxation of the cervical spine. 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 Electronically Signed   By: Alex D Bibbey M.D.   On: 08/15/2021 13:26  ° °CT Cervical Spine Wo Contrast ° °Result Date: 08/15/2021 °CLINICAL DATA:  Head trauma, fall, scalp laceration EXAM: CT HEAD WITHOUT CONTRAST CT MAXILLOFACIAL WITHOUT CONTRAST CT CERVICAL SPINE WITHOUT CONTRAST TECHNIQUE: Multidetector CT imaging of the head, cervical spine, and maxillofacial structures were performed using the standard protocol without intravenous contrast. Multiplanar CT image reconstructions of the cervical spine and maxillofacial structures were also generated. RADIATION DOSE REDUCTION: This exam was performed according to the departmental dose-optimization program which includes automated exposure control,  adjustment of the mA and/or kV according to patient size and/or use of iterative reconstruction technique. COMPARISON:  05/10/2021 FINDINGS: CT HEAD FINDINGS Brain: No evidence of acute infarction, hemorrhage, hydrocephalus, extra-axial collection or mass lesion/mass effect. Periventricular and deep white matter hypodensity. Vascular: No hyperdense vessel or unexpected calcification. CT FACIAL BONES FINDINGS Skull: Normal. Negative for fracture or focal lesion. Facial bones: No displaced fractures or dislocations. Sinuses/Orbits: No acute finding. Other: Soft tissue laceration of the high right parietal scalp (series 3, image 9). CT CERVICAL SPINE FINDINGS Alignment: Normal. Skull base and vertebrae: No acute fracture. No primary bone lesion or focal pathologic process. Soft tissues and spinal canal: No prevertebral fluid or swelling. No visible canal hematoma. Disc levels: Generally moderate multilevel disc space height loss and osteophytosis, with ankylosis at multiple levels including C1-C2 and C4-C7. Upper chest: Negative. Other: None. IMPRESSION: 1. No acute intracranial pathology. Small-vessel white matter disease. 2. No displaced fracture or dislocation of the facial bones. 3. Soft tissue laceration of the high right parietal scalp. 4. No fracture or static subluxation of the cervical spine. 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 Electronically Signed   By: Alex D Bibbey M.D.   On: 08/15/2021 13:26  ° °DG Hip Unilat W or Wo Pelvis 2-3 Views Right ° °Result Date: 08/15/2021 °CLINICAL DATA:  Right hip pain post fall EXAM: DG HIP (WITH OR WITHOUT PELVIS) 2-3V RIGHT COMPARISON:  01/06/2020 FINDINGS: Osseous demineralization. Mild narrowing of the hip joints. SI joints preserved. No fracture, dislocation, or bone destruction. Degenerative disc disease changes at lower lumbar spine. IMPRESSION: No acute osseous abnormalities. Electronically Signed   By: Mark  Boles M.D.   On: 08/15/2021 13:25   ° °CT Maxillofacial Wo Contrast ° °Result Date: 08/15/2021 °CLINICAL DATA:  Head trauma, fall, scalp laceration EXAM: CT HEAD WITHOUT CONTRAST CT MAXILLOFACIAL WITHOUT CONTRAST CT CERVICAL SPINE WITHOUT CONTRAST TECHNIQUE: Multidetector CT imaging of the head, cervical spine, and maxillofacial structures were performed using the standard protocol without intravenous contrast. Multiplanar CT image reconstructions of the cervical spine and maxillofacial structures were also generated. RADIATION DOSE REDUCTION: This exam was performed according to the departmental dose-optimization program which includes automated exposure control, adjustment of the mA and/or kV according to patient size and/or use of iterative reconstruction technique. COMPARISON:  05/10/2021 FINDINGS: CT HEAD FINDINGS Brain: No evidence of acute infarction, hemorrhage, hydrocephalus, extra-axial collection or mass lesion/mass effect. Periventricular and deep white matter hypodensity. Vascular: No hyperdense vessel or unexpected calcification. CT FACIAL BONES FINDINGS Skull: Normal. Negative for fracture or focal lesion. Facial   Facial bones: No displaced fractures or dislocations. Sinuses/Orbits: No acute finding. Other: Soft tissue laceration of the high right parietal scalp (series 3, image 9). CT CERVICAL SPINE FINDINGS Alignment: Normal. Skull base and vertebrae: No acute fracture. No primary bone lesion or focal pathologic process. Soft tissues and spinal canal: No prevertebral fluid or swelling. No visible canal hematoma. Disc levels: Generally moderate multilevel disc space height loss and osteophytosis, with ankylosis at multiple levels including C1-C2 and C4-C7. Upper chest: Negative. Other: None. IMPRESSION: 1. No acute intracranial pathology. Small-vessel white matter disease. 2. No displaced fracture or dislocation of the facial bones. 3. Soft tissue laceration of the high right parietal scalp. 4. No fracture or static subluxation of the cervical  spine. 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 Electronically Signed   By: Delanna Ahmadi M.D.   On: 08/15/2021 13:26    Procedures .Marland KitchenLaceration Repair  Date/Time: 08/15/2021 2:23 PM Performed by: Isla Pence, MD Authorized by: Isla Pence, MD   Consent:    Consent obtained:  Verbal   Consent given by:  Patient   Risks discussed:  Infection Universal protocol:    Procedure explained and questions answered to patient or proxy's satisfaction: yes     Patient identity confirmed:  Verbally with patient Anesthesia:    Anesthesia method:  Local infiltration   Local anesthetic:  Lidocaine 2% WITH epi Laceration details:    Location:  Scalp   Scalp location:  R parietal   Length (cm):  15 Pre-procedure details:    Preparation:  Patient was prepped and draped in usual sterile fashion Exploration:    Hemostasis achieved with:  Epinephrine and direct pressure Treatment:    Area cleansed with:  Chlorhexidine and saline   Amount of cleaning:  Standard   Irrigation solution:  Sterile saline Skin repair:    Repair method:  Staples   Number of staples:  18 Approximation:    Approximation:  Close Repair type:    Repair type:  Simple Post-procedure details:    Dressing:  Antibiotic ointment and non-adherent dressing   Procedure completion:  Tolerated well, no immediate complications Comments:     Pt has a listed allergy to lidocaine.  She is unaware of the rxn.  She and daughter are in agreement to numb with lidocaine here as wound is so extensive.  If there are any complications, we can deal with them.  She had no problems with the lidocaine here today.    Medications Ordered in ED Medications  lidocaine-EPINEPHrine (XYLOCAINE W/EPI) 2 %-1:200000 (PF) injection 10 mL (has no administration in time range)  acetaminophen (TYLENOL) tablet 1,000 mg (1,000 mg Oral Given 08/15/21 1357)    ED Course/ Medical Decision Making/ A&P                            Medical Decision Making Amount and/or Complexity of Data Reviewed Radiology: ordered.  Risk OTC drugs. Prescription drug management.   This patient presents to the ED for concern of fall, this involves an extensive number of treatment options, and is a complaint that carries with it a high risk of complications and morbidity.  The differential diagnosis includes laceration, fractures, contusions   Co morbidities that complicate the patient evaluation  hypothyroidism, depression, gerd, fibromyalgia, dvt, and gi bleed   Additional history obtained:  Additional history obtained from epic chart review External records from outside source obtained and reviewed including daughter    Imaging Studies  ordered:  I ordered imaging studies including cxr, right elbow xr, hip xr.  CT head/face/c-spine  I independently visualized and interpreted imaging which showed no fractures.  CTs:   IMPRESSION:  1. No acute intracranial pathology. Small-vessel white matter  disease.  2. No displaced fracture or dislocation of the facial bones.  3. Soft tissue laceration of the high right parietal scalp.  4. No fracture or static subluxation of the cervical spine.  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   I agree with the radiologist interpretation   Cardiac Monitoring:  The patient was maintained on a cardiac monitor.  I personally viewed and interpreted the cardiac monitored which showed an underlying rhythm of: nsr   Medicines ordered and prescription drug management:  I ordered medication including tylenol  for pain  Reevaluation of the patient after these medicines showed that the patient improved I have reviewed the patients home medicines and have made adjustments as needed   Test Considered:  Ct scans, xrays   Critical Interventions:  Repairing scalp wound    Problem List / ED Course:  Scalp wound:  Stapled.  Pt unsure of tetanus status.  She will  call her doctor tomorrow and inquire.  This wound is low risk for tetanus as it occurred inside and is not dirty.  She can wait until tomorrow.  Pt to return in 10 days for staple removal. Fall:  no fx or internal injuries.  She is able to walk.  Pt is to return if worse.  F/u with pcp.   Reevaluation:  After the interventions noted above, I reevaluated the patient and found that they have :improved   Social Determinants of Health:  Lives at home with daughter   Dispostion:  After consideration of the diagnostic results and the patients response to treatment, I feel that the patent would benefit from discharge with outpatient f/u.          Final Clinical Impression(s) / ED Diagnoses Final diagnoses:  Laceration of scalp, initial encounter  Fall, initial encounter  Abrasion of right elbow, initial encounter  Contusion of right hip, initial encounter    Rx / DC Orders ED Discharge Orders     None         Isla Pence, MD 08/15/21 1430

## 2021-08-15 NOTE — ED Notes (Signed)
Suture cart at pts bedside ?

## 2021-09-17 DIAGNOSIS — S32030D Wedge compression fracture of third lumbar vertebra, subsequent encounter for fracture with routine healing: Secondary | ICD-10-CM | POA: Insufficient documentation

## 2021-09-17 DIAGNOSIS — S22080D Wedge compression fracture of T11-T12 vertebra, subsequent encounter for fracture with routine healing: Secondary | ICD-10-CM | POA: Insufficient documentation

## 2021-09-17 DIAGNOSIS — S32040A Wedge compression fracture of fourth lumbar vertebra, initial encounter for closed fracture: Secondary | ICD-10-CM | POA: Insufficient documentation

## 2022-04-14 DIAGNOSIS — I739 Peripheral vascular disease, unspecified: Secondary | ICD-10-CM | POA: Insufficient documentation

## 2022-04-14 DIAGNOSIS — R600 Localized edema: Secondary | ICD-10-CM | POA: Insufficient documentation

## 2022-04-18 DIAGNOSIS — Z741 Need for assistance with personal care: Secondary | ICD-10-CM | POA: Insufficient documentation

## 2022-07-01 ENCOUNTER — Emergency Department (HOSPITAL_COMMUNITY)
Admission: EM | Admit: 2022-07-01 | Discharge: 2022-07-01 | Disposition: A | Discharge status: Home / Self Care | Payer: Medicare Other | Attending: Emergency Medicine | Admitting: Emergency Medicine

## 2022-07-01 ENCOUNTER — Emergency Department (HOSPITAL_COMMUNITY): Payer: Medicare Other

## 2022-07-01 ENCOUNTER — Other Ambulatory Visit: Payer: Self-pay

## 2022-07-01 ENCOUNTER — Encounter (HOSPITAL_COMMUNITY): Payer: Self-pay | Admitting: *Deleted

## 2022-07-01 DIAGNOSIS — Z8616 Personal history of COVID-19: Secondary | ICD-10-CM | POA: Insufficient documentation

## 2022-07-01 DIAGNOSIS — I82812 Embolism and thrombosis of superficial veins of left lower extremities: Secondary | ICD-10-CM | POA: Diagnosis not present

## 2022-07-01 DIAGNOSIS — E039 Hypothyroidism, unspecified: Secondary | ICD-10-CM | POA: Insufficient documentation

## 2022-07-01 DIAGNOSIS — R600 Localized edema: Secondary | ICD-10-CM | POA: Insufficient documentation

## 2022-07-01 DIAGNOSIS — Z79899 Other long term (current) drug therapy: Secondary | ICD-10-CM | POA: Insufficient documentation

## 2022-07-01 DIAGNOSIS — M7989 Other specified soft tissue disorders: Secondary | ICD-10-CM | POA: Diagnosis present

## 2022-07-01 DIAGNOSIS — Z7901 Long term (current) use of anticoagulants: Secondary | ICD-10-CM | POA: Insufficient documentation

## 2022-07-01 LAB — CBC WITH DIFFERENTIAL/PLATELET
Abs Immature Granulocytes: 0.01 10*3/uL (ref 0.00–0.07)
Basophils Absolute: 0.1 10*3/uL (ref 0.0–0.1)
Basophils Relative: 1 %
Eosinophils Absolute: 0.3 10*3/uL (ref 0.0–0.5)
Eosinophils Relative: 6 %
HCT: 32.9 % — ABNORMAL LOW (ref 36.0–46.0)
Hemoglobin: 10.2 g/dL — ABNORMAL LOW (ref 12.0–15.0)
Immature Granulocytes: 0 %
Lymphocytes Relative: 34 %
Lymphs Abs: 1.8 10*3/uL (ref 0.7–4.0)
MCH: 29.5 pg (ref 26.0–34.0)
MCHC: 31 g/dL (ref 30.0–36.0)
MCV: 95.1 fL (ref 80.0–100.0)
Monocytes Absolute: 0.6 10*3/uL (ref 0.1–1.0)
Monocytes Relative: 12 %
Neutro Abs: 2.5 10*3/uL (ref 1.7–7.7)
Neutrophils Relative %: 47 %
Platelets: 207 10*3/uL (ref 150–400)
RBC: 3.46 MIL/uL — ABNORMAL LOW (ref 3.87–5.11)
RDW: 14.3 % (ref 11.5–15.5)
WBC: 5.3 10*3/uL (ref 4.0–10.5)
nRBC: 0 % (ref 0.0–0.2)

## 2022-07-01 LAB — COMPREHENSIVE METABOLIC PANEL
ALT: 12 U/L (ref 0–44)
AST: 18 U/L (ref 15–41)
Albumin: 3.4 g/dL — ABNORMAL LOW (ref 3.5–5.0)
Alkaline Phosphatase: 88 U/L (ref 38–126)
Anion gap: 9 (ref 5–15)
BUN: 8 mg/dL (ref 8–23)
CO2: 28 mmol/L (ref 22–32)
Calcium: 8.4 mg/dL — ABNORMAL LOW (ref 8.9–10.3)
Chloride: 102 mmol/L (ref 98–111)
Creatinine, Ser: 0.65 mg/dL (ref 0.44–1.00)
GFR, Estimated: >60 mL/min (ref >60)
Glucose, Bld: 109 mg/dL — ABNORMAL HIGH (ref 70–99)
Potassium: 3.3 mmol/L — ABNORMAL LOW (ref 3.5–5.1)
Sodium: 139 mmol/L (ref 135–145)
Total Bilirubin: 0.5 mg/dL (ref 0.3–1.2)
Total Protein: 6.8 g/dL (ref 6.5–8.1)

## 2022-07-01 LAB — BRAIN NATRIURETIC PEPTIDE: B Natriuretic Peptide: 72 pg/mL (ref 0.0–100.0)

## 2022-07-01 MED ORDER — FUROSEMIDE 20 MG PO TABS: 20.0000 mg | ORAL_TABLET | Freq: Every day | ORAL | 0 refills | Status: DC

## 2022-07-01 MED ORDER — APIXABAN (ELIQUIS) VTE STARTER PACK (10MG AND 5MG): ORAL_TABLET | ORAL | 0 refills | Status: DC

## 2022-07-01 NOTE — Discharge Instructions (Signed)
Based on the testing that we have done today it appears that you do have a blood clot in your left leg.  This is a superficial blood clot and it may not be causing all of your swelling however it may contribute to it so I have started you on a blood thinner called Eliquis.  Please take this medication exactly as it has been prescribed, you can pick it up at your pharmacy today.  I would also like for you to take Lasix once a day for 10 days which will help you to get off some of the extra fluid.  You will need to see your doctor within 10 days for a recheck, this is mandatory, return to the ER for severe worsening symptoms.

## 2022-07-01 NOTE — ED Triage Notes (Signed)
Pt c/o bilateral feet swelling x 6 weeks and pt states her PCP put her on medication; pt has since finished the medication and has started having swelling again  The swelling started again yesterday  Pt has dry cough, denies any sob

## 2022-07-01 NOTE — ED Provider Notes (Signed)
[place] EMERGENCY DEPARTMENT AT [facility] Provider Note   CSN: [id number] Arrival date & time: 07/01/22  1005     History  Chief Complaint  Patient presents with   Foot Swelling    Stefanie [name] is a 82 y.o. female.  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  She has never had any history of heart issues heart failure DVT or other thromboembolic state.  She reports that she was recently at an outside hospital because of edema of her legs, records confirm that this was in November 2023, at that time the patient was diagnosed with COVID-19 and had some associated swelling of the legs.  She had been placed on a diuretic which she no longer takes.  Over the last few days she has noticed that the swelling has increased left more than right which is consistent with what it look like in the past as well.  She also reports that she thinks she may have had an ultrasound of her legs at that time but cannot recall.  Her family doctor is Dr. [name] with [facility] in [place].  She has an appointment within the next 2 weeks for a physical.  The patient denies shortness of breath coughing or fever, she has no chest pain nausea vomiting or diarrhea.  She is accompanied by her home health aide who sees her 3 times a week and has noticed significant swelling over the last week  Home Medications Prior to Admission medications   Medication Sig Start Date End Date Taking? Authorizing Provider  APIXABAN Everlene Balls) VTE STARTER PACK (10MG  AND 5MG ) Take as directed on package: start with two-5mg  tablets twice daily for 7 days. On day 8, switch to one-5mg  tablet twice daily. 07/01/22  Yes , MD  furosemide (LASIX) 20 MG tablet Take 1 tablet (20 mg total) by mouth daily for 10 days. 07/01/22 07/11/22 Yes 07/03/22, MD  cetirizine (ZYRTEC) 10 MG tablet Take 10 mg by mouth daily.     [provider]  escitalopram (LEXAPRO) 20 MG tablet Take 10 mg by mouth daily.     [provider]  fluticasone (FLONASE) 50 MCG/ACT nasal spray Place 2 sprays into the nose daily.    [provider]  hyoscyamine (LEVSIN SL) 0.125 MG SL tablet Place 1 tablet (0.125 mg total) under the tongue every 4 (four) hours as needed. 08/26/15   Pyrtle, Eber Hong, MD  levothyroxine (SYNTHROID, LEVOTHROID) 75 MCG tablet Take 75 mcg by mouth daily before breakfast.    [provider]  lubiprostone (AMITIZA) 8 MCG capsule 1 tab every 4-6 hpurs as needed for RLQ pain 08/26/15   Pyrtle, Carie Caddy, MD  meloxicam (MOBIC) 7.5 MG tablet Take 7.5 mg by mouth as needed for pain.    [provider]  Na Sulfate-K Sulfate-Mg Sulf (SUPREP BOWEL PREP) SOLN Take 1 kit by mouth once. 08/26/15   Pyrtle, Carie Caddy, MD  omeprazole-sodium bicarbonate (ZEGERID) 40-1100 MG capsule Take 1 capsule by mouth daily before breakfast. 08/26/15   Pyrtle, Carie Caddy, MD  Probiotic Product ([facility] [employer] [facility] PO) Take by mouth. Every day    [provider]  tamsulosin (FLOMAX) 0.4 MG CAPS capsule Take 1 capsule (0.4 mg total) by mouth daily after supper. 11/26/18   Carie Caddy, MD  Vitamin D, Ergocalciferol, (DRISDOL) 1.25 MG (50000 UT) CAPS capsule Take 50,000  Units by mouth once a week.    [provider]      Allergies    Lidocaine, Metoclopramide, Other, Sulfa antibiotics, Z-pak [azithromycin], Metoclopramide hcl, Penicillins, Procaine hcl, and Macrodantin [nitrofurantoin macrocrystal]    Review of Systems   Review of Systems  All other systems reviewed and are negative.   Physical Exam Updated Vital Signs BP 133/61   Pulse 77   Temp 98.1 F (36.7 C) (Oral)   Resp (!) 22   Ht 1.626 m (5\' 4" )   Wt 63 kg   SpO2 93%   BMI 23.86 kg/m  Physical Exam Vitals and nursing note reviewed.  Constitutional:      General: She is not in acute distress.    Appearance: She is  well-developed.  HENT:     Head: Normocephalic and atraumatic.     Mouth/Throat:     Pharynx: No oropharyngeal exudate.  Eyes:     General: No scleral icterus.       Right eye: No discharge.        Left eye: No discharge.     Conjunctiva/sclera: Conjunctivae normal.     Pupils: Pupils are equal, round, and reactive to light.  Neck:     Thyroid: No thyromegaly.     Vascular: No JVD.  Cardiovascular:     Rate and Rhythm: Normal rate and regular rhythm.     Heart sounds: Normal heart sounds. No murmur heard.    No friction rub. No gallop.  Pulmonary:     Effort: Pulmonary effort is normal. No respiratory distress.     Breath sounds: Normal breath sounds. No wheezing or rales.  Abdominal:     General: Bowel sounds are normal. There is no distension.     Palpations: Abdomen is soft. There is no mass.     Tenderness: There is no abdominal tenderness.  Musculoskeletal:        General: No tenderness. Normal range of motion.     Cervical back: Normal range of motion and neck supple.     Right lower leg: Edema present.     Left lower leg: Edema present.     Comments: Edema is definitely asymmetrical left greater than right, pitting edema is only below the knees, there is no other edema, no anasarca  Lymphadenopathy:     Cervical: No cervical adenopathy.  Skin:    General: Skin is warm and dry.     Findings: Erythema present. No rash.     Comments: There is a slight pink hue to the bilateral lower extremities but no increased warmth no erythema, no induration  Neurological:     Mental Status: She is alert.     Coordination: Coordination normal.     Comments: Awake alert and able answer my questions  Psychiatric:        Behavior: Behavior normal.     ED Results / Procedures / Treatments   Labs (all labs ordered are listed, but only abnormal results are displayed) Labs Reviewed  COMPREHENSIVE METABOLIC PANEL - Abnormal; Notable for the following components:      Result Value    Potassium 3.3 (*)    Glucose, Bld 109 (*)    Calcium 8.4 (*)    Albumin 3.4 (*)    All other components within normal limits  CBC WITH DIFFERENTIAL/PLATELET - Abnormal; Notable for the following components:   RBC 3.46 (*)    Hemoglobin 10.2 (*)    HCT 32.9 (*)  All other components within normal limits  BRAIN NATRIURETIC PEPTIDE    EKG EKG Interpretation  Date/Time:  Friday July 01 2022 11:39:13 EST Ventricular Rate:  76 PR Interval:  224 QRS Duration: 91 QT Interval:  410 QTC Calculation: 458 R Axis:   -2 Text Interpretation: Sinus rhythm Prolonged PR interval Borderline T abnormalities, diffuse leads since last tracing no significant change Confirmed by Eber Hong ([id number]) on 07/01/2022 11:41:29 AM  Radiology US Venous Img Lower Unilateral Left  Result Date: 07/01/2022 CLINICAL DATA:  Swelling x6 weeks EXAM: LEFT LOWER EXTREMITY VENOUS DOPPLER ULTRASOUND TECHNIQUE: Gray-scale sonography with compression, as well as color and duplex ultrasound, were performed to evaluate the deep venous system(s) from the level of the common femoral vein through the popliteal and proximal calf veins. COMPARISON:  04/19/2022 FINDINGS: VENOUS There is hypoechoic noncompressible thrombus in the great saphenous vein extending to the saphenofemoral junction, without compromise of the common femoral vein lumen. Limited evaluation of the more distal great saphenous vein. Normal compressibility of the common femoral, superficial femoral, and popliteal veins, as well as the visualized calf veins. Visualized portions of profunda femoral vein unremarkable. No filling defects to suggest DVT on grayscale or color Doppler imaging. Doppler waveforms show normal direction of venous flow, normal respiratory plasticity and response to augmentation. Limited views of the contralateral common femoral vein are unremarkable. OTHER None. Limitations: none IMPRESSION: 1. Superficial thrombophlebitis involving the great  saphenous vein extending to the saphenofemoral junction. 2. Negative for DVT. Electronically Signed   By: Corlis Leak M.D.   On: 07/01/2022 11:59   DG Chest Port 1 View  Result Date: 07/01/2022 CLINICAL DATA:  sob, swelling EXAM: PORTABLE CHEST 1 VIEW COMPARISON:  April 09, 2022 FINDINGS: The cardiomediastinal silhouette is unchanged in contour.Tortuous thoracic aorta. No pleural effusion. No pneumothorax. No acute pleuroparenchymal abnormality. IMPRESSION: No acute cardiopulmonary abnormality. Electronically Signed   By: Meda Klinefelter M.D.   On: 07/01/2022 11:30    Procedures Procedures    Medications Ordered in ED Medications - No data to display  ED Course/ Medical Decision Making/ A&P                             Medical Decision Making Amount and/or Complexity of Data Reviewed Labs: ordered. Radiology: ordered. ECG/medicine tests: ordered.   This patient presents to the ED for concern of increased swelling of the legs, this involves an extensive number of treatment options, and is a complaint that carries with it a high risk of complications and morbidity.  The differential diagnosis includes congestive heart failure, DVT, hypoalbuminemia, spilling protein in the urine   Co morbidities that complicate the patient evaluation  Hypothyroidism, elderly, relatively immobile   Additional history obtained:  Additional history obtained from caregiver at the bedside as well as electronic medical record External records from outside source obtained and reviewed including outside records   Lab Tests:  I Ordered, and personally interpreted labs.  The pertinent results include: CBC with mild anemia but no leukocytosis, metabolic panel with mild hypokalemia, BNP of 72   Imaging Studies ordered:  I ordered imaging studies including ultrasound of the legs which showed some saphenous vein thrombosis which appears to be approximating the saphenofemoral junction.  No signs of deep  femoral or deep venous thrombosis.  Chest x-ray without any acute findings I independently visualized and interpreted imaging which showed as above I agree with the radiologist interpretation   Cardiac  Monitoring: / EKG:  The patient was maintained on a cardiac monitor.  I personally viewed and interpreted the cardiac monitored which showed an underlying rhythm of: Sinus rhythm    Problem List / ED Course / Critical interventions / Medication management  Will start Eliquis due to the patient's history of what appears to be some greater saphenous venous thrombosis and increasing swelling of the leg. I ordered medication including Eliquis for thrombosis Reevaluation of the patient after these medicines showed that the patient improved I have reviewed the patients home medicines and have made adjustments as needed   Social Determinants of Health:  Has home health aide that will help with medications   Test / Admission - Considered:  Consider admission but the patient is hemodynamically stable without any other causes of swelling of her leg Patient updated on treatment plan and is agreeable with plan         Final Clinical Impression(s) / ED Diagnoses Final diagnoses:  Leg swelling  Saphenous vein clot, left    Rx / DC Orders ED Discharge Orders          Ordered    APIXABAN (ELIQUIS) VTE STARTER PACK (10MG  AND 5MG )        07/01/22 1304    furosemide (LASIX) 20 MG tablet  Daily        07/01/22 1304              Noemi Chapel, MD 07/01/22 1305

## 2022-07-05 ENCOUNTER — Emergency Department (HOSPITAL_COMMUNITY): Payer: Medicare Other

## 2022-07-05 ENCOUNTER — Emergency Department (HOSPITAL_COMMUNITY)
Admission: EM | Admit: 2022-07-05 | Discharge: 2022-07-05 | Disposition: A | Discharge status: Home / Self Care | Payer: Medicare Other | Attending: Emergency Medicine | Admitting: Emergency Medicine

## 2022-07-05 ENCOUNTER — Other Ambulatory Visit: Payer: Self-pay

## 2022-07-05 ENCOUNTER — Encounter (HOSPITAL_COMMUNITY): Payer: Self-pay | Admitting: Emergency Medicine

## 2022-07-05 DIAGNOSIS — E039 Hypothyroidism, unspecified: Secondary | ICD-10-CM | POA: Diagnosis not present

## 2022-07-05 DIAGNOSIS — M79605 Pain in left leg: Secondary | ICD-10-CM | POA: Diagnosis not present

## 2022-07-05 DIAGNOSIS — Z7901 Long term (current) use of anticoagulants: Secondary | ICD-10-CM | POA: Insufficient documentation

## 2022-07-05 DIAGNOSIS — R600 Localized edema: Secondary | ICD-10-CM | POA: Insufficient documentation

## 2022-07-05 DIAGNOSIS — Z79890 Hormone replacement therapy (postmenopausal): Secondary | ICD-10-CM | POA: Insufficient documentation

## 2022-07-05 DIAGNOSIS — I82402 Acute embolism and thrombosis of unspecified deep veins of left lower extremity: Secondary | ICD-10-CM

## 2022-07-05 MED ORDER — FUROSEMIDE 20 MG PO TABS: 20.0000 mg | ORAL_TABLET | Freq: Every day | ORAL | 0 refills | Status: DC

## 2022-07-05 MED ORDER — FUROSEMIDE 40 MG PO TABS
20.0000 mg | ORAL_TABLET | Freq: Once | ORAL | Status: AC
  Administered 2022-07-05: 20 mg via ORAL
  Filled 2022-07-05: qty 1

## 2022-07-05 NOTE — Discharge Instructions (Signed)
Please continue to take the blood thinner you were prescribed 4 days ago.  I have also resent the pills for the swelling to your pharmacy.  Do not hesitate to return with any worsening symptoms, especially trouble breathing, palpitations, lightheadedness or any other concerning symptoms.  Keep your follow-up with your PCP.

## 2022-07-05 NOTE — ED Triage Notes (Signed)
Pt seen on 1/26 for leg swelling. Patient is back today because the pain in her left leg has gotten worse and become more swollen. Leg is red/ inflamed and warm to touch.

## 2022-07-05 NOTE — ED Provider Triage Note (Signed)
Emergency Medicine Provider Triage Evaluation Note  Stefanie [name] , a 82 y.o. female  was evaluated in triage.  Pt complains of leg swelling.  Was seen 4 days for the same.  Diagnosed with superficial thrombi.  Was started on DOAC however believes that she may be taking it incorrectly.  Says that it is increasingly painful, red and swollen.  Review of Systems  Positive:  Negative:   Physical Exam  BP (!) 164/68 (BP Location: Right Arm)   Pulse 78   Temp 98.1 F (36.7 C) (Oral)   Resp 18   Ht 5\' 4"  (1.626 m)   Wt 64 kg   SpO2 97%   BMI 24.22 kg/m  Gen:   Awake, no distress   Resp:  Normal effort  MSK:   Moves extremities without difficulty  Other:  2+ pitting edema to the left lower extremity.  Also erythematous, not convincingly cellulitic.  Distal pulses present  Medical Decision Making  Medically screening exam initiated at 2:46 PM.  Appropriate orders placed.  Stefanie [name] was informed that the remainder of the evaluation will be completed by another provider, this initial triage assessment does not replace that evaluation, and the importance of remaining in the ED until their evaluation is complete.     Rhae Hammock, [place] 07/05/22 1448

## 2022-07-05 NOTE — ED Provider Notes (Signed)
[facility] Provider Note   CSN: [id number] Arrival date & time: 07/05/22  1315     History  Chief Complaint  Patient presents with   Leg Pain    Stefanie [name] is a 82 y.o. female with a past medical history of hypothyroidism presenting today due to leg pain.  She was seen 4 days ago for the same and started on Eliquis but tells me she is unsure if she is taking it correctly.  Also says that she does not take a water pill.  No history of heart failure.  No shortness of breath, palpitations, lightheadedness or syncope.  Does endorse a sedentary lifestyle.  She is coming in today with her home health aide who is concerned that the leg swelling is not going down and might be getting larger.   Leg Pain      Home Medications Prior to Admission medications   Medication Sig Start Date End Date Taking? Authorizing Provider  APIXABAN (ELIQUIS) VTE STARTER PACK (10MG  AND 5MG ) Take as directed on package: start with two-5mg  tablets twice daily for 7 days. On day 8, switch to one-5mg  tablet twice daily. 07/01/22   Noemi Chapel, MD  cetirizine (ZYRTEC) 10 MG tablet Take 10 mg by mouth daily.    [provider]  escitalopram (LEXAPRO) 20 MG tablet Take 10 mg by mouth daily.     [provider]  fluticasone (FLONASE) 50 MCG/ACT nasal spray Place 2 sprays into the nose daily.    [provider]  furosemide (LASIX) 20 MG tablet Take 1 tablet (20 mg total) by mouth daily for 10 days. 07/01/22 07/11/22  Noemi Chapel, MD  hyoscyamine (LEVSIN SL) 0.125 MG SL tablet Place 1 tablet (0.125 mg total) under the tongue every 4 (four) hours as needed. 08/26/15   Pyrtle, Lajuan Lines, MD  levothyroxine (SYNTHROID, LEVOTHROID) 75 MCG tablet Take 75 mcg by mouth daily before breakfast.    [provider]  lubiprostone (AMITIZA) 8 MCG capsule 1 tab every 4-6 hpurs as needed for RLQ pain 08/26/15   Pyrtle, Lajuan Lines, MD  meloxicam (MOBIC) 7.5 MG  tablet Take 7.5 mg by mouth as needed for pain.    [provider]  Na Sulfate-K Sulfate-Mg Sulf (SUPREP BOWEL PREP) SOLN Take 1 kit by mouth once. 08/26/15   Pyrtle, Lajuan Lines, MD  omeprazole-sodium bicarbonate (ZEGERID) 40-1100 MG capsule Take 1 capsule by mouth daily before breakfast. 08/26/15   Pyrtle, Lajuan Lines, MD  Probiotic Product (Lorane) Take by mouth. Every day    [provider]  tamsulosin (FLOMAX) 0.4 MG CAPS capsule Take 1 capsule (0.4 mg total) by mouth daily after supper. 11/26/18   Franchot Gallo, MD  Vitamin D, Ergocalciferol, (DRISDOL) 1.25 MG (50000 UT) CAPS capsule Take 50,000 Units by mouth once a week.    [provider]      Allergies    Lidocaine, Metoclopramide, Other, Sulfa antibiotics, Z-pak [azithromycin], Metoclopramide hcl, Penicillins, Procaine hcl, and Macrodantin [nitrofurantoin macrocrystal]    Review of Systems   Review of Systems  Physical Exam Updated Vital Signs BP (!) 164/68 (BP Location: Right Arm)   Pulse 78   Temp 98.1 F (36.7 C) (Oral)   Resp 18   Ht 5\' 4"  (1.626 m)   Wt 64 kg   SpO2 97%   BMI 24.22 kg/m  Physical Exam Vitals and nursing note reviewed.  Constitutional:      Appearance: Normal  appearance.  HENT:     Head: Normocephalic and atraumatic.  Eyes:     General: No scleral icterus.    Conjunctiva/sclera: Conjunctivae normal.  Pulmonary:     Effort: Pulmonary effort is normal. No respiratory distress.  Musculoskeletal:     Right lower leg: Edema present.     Left lower leg: Edema present.     Comments: Bilateral lower extremity pitting edema, left worse than right.  DP pulses present bilaterally.  No signs of cellulitis or wounds  Skin:    Findings: No rash.  Neurological:     Mental Status: She is alert.  Psychiatric:        Mood and Affect: Mood normal.     ED Results / Procedures / Treatments   Labs (all labs ordered are listed, but only abnormal results are  displayed) Labs Reviewed - No data to display  EKG None  Radiology No results found.  Procedures Procedures   Medications Ordered in ED Medications  furosemide (LASIX) tablet 20 mg (has no administration in time range)    ED Course/ Medical Decision Making/ A&P Clinical Course as of 07/05/22 1600  Tue Jul 05, 2022  1533 US Venous Img Lower  Left (DVT Study) [AH]    Clinical Course User Index [AH] Rica Koyanagi                             Medical Decision Making  82 year old female presenting today with leg swelling.  Differential includes but is not limited to DVT, heart failure, cellulitis.  This is not an exhaustive differential.    Past Medical History / Co-morbidities / Social History: Hypothyroidism, depression, superficial thrombophlebitis diagnosed 4 days ago.   Additional history: I viewed the patient's visit to the emergency department 4 days ago.  She had lab work done at that time with a normal BNP.  At that time she also had a DVT study revealing of superficial thrombophlebitis involving the great saphenous vein but no DVT.   Physical Exam: Pertinent physical exam findings include Pitting edema in bilateral lower extremities Marked edema in the left lower extremity when compared to the right  Lab Tests: I ordered, and personally interpreted labs.  The pertinent results include: Bilateral lower extremity pitting edema.  Worse on the left than the right.  Erythema but no convincing cellulitis.   Imaging Studies: I ordered and independently visualized and interpreted DVT study and I agree with the radiologist that there are no changes from a couple days ago.  Still has superficial thrombophlebitis that appears to extend into the deep venous system.     Medications: Given 1 dose of Lasix    MDM/Disposition: This is a 82 year old female presenting today with left leg pain and swelling.  Was recently discharged on Eliquis.  She is unsure  if she is taking it correctly.  When I look at the pack she only started this yesterday.  She took a dose yesterday morning but did not take 1 last night.  Restarted this morning.  I suspect there has not been enough doses to have changed any of her symptoms.  She also says that she never got a diuretic which ICU was prescribed.  I will send this to her pharmacy again because I do believe a large amount of her pain and swelling is secondary to her fluid retention.  She will be discharged at this time with her caregiver.  They  both voiced understanding of the plan and will continue with her PCP follow-up.   Final Clinical Impression(s) / ED Diagnoses Final diagnoses:  Left leg pain  Edema of both lower legs    Rx / DC Orders ED Discharge Orders          Ordered    furosemide (LASIX) 20 MG tablet  Daily        07/05/22 1603           Results and diagnoses were explained to the patient. Return precautions discussed in full. Patient had no additional questions and expressed complete understanding.   This chart was dictated using voice recognition software.  Despite best efforts to proofread,  errors can occur which can change the documentation meaning.    Darliss Ridgel 07/05/22 1605    Godfrey Pick, MD 07/09/22 [phone]

## 2022-07-19 DIAGNOSIS — R4189 Other symptoms and signs involving cognitive functions and awareness: Secondary | ICD-10-CM | POA: Insufficient documentation

## 2022-08-18 ENCOUNTER — Ambulatory Visit: Payer: Medicare Other | Admitting: Podiatry

## 2022-08-18 DIAGNOSIS — M216X2 Other acquired deformities of left foot: Secondary | ICD-10-CM

## 2022-08-18 DIAGNOSIS — L97521 Non-pressure chronic ulcer of other part of left foot limited to breakdown of skin: Secondary | ICD-10-CM

## 2022-08-18 NOTE — Patient Instructions (Signed)
You can was the left foot ulcer with soap and water, dry well. Apply a small amount of medihoney and cover with a bandage. You can use the pads for offloading.  Monitor for any signs/symptoms of infection. Call the office immediately if any occur or go directly to the emergency room. Call with any questions/concerns.

## 2022-08-18 NOTE — Progress Notes (Signed)
Subjective:   Patient ID: Stefanie [name], female   DOB: 82 y.o.   MRN: [id number]   HPI Chief Complaint  Patient presents with   Callouses    ULCERATED CALLOUSE, LEFT FOOT    82 year old female presents the office for above concerns.  It has been ongoing for a long time but it has been getting worse. No tratment other than a pad to take the pressure off. No driangae or fluid. It does hurt.  No fevers or chills that she reports.  She has no other concerns today.   Review of Systems  All other systems reviewed and are negative.  Past Medical History:  Diagnosis Date   Anxiety    Arthritis    Colon polyps    Depression    Diverticulosis of colon (without mention of hemorrhage)    Fibromyalgia    GERD (gastroesophageal reflux disease)    Hiatal hernia    History of IBS    History of kidney stones    Hypothyroid    Hypothyroidism     Past Surgical History:  Procedure Laterality Date   ABDOMINAL HYSTERECTOMY     ANKLE SURGERY     bilateral    APPENDECTOMY  1985   BREAST BIOPSY Left    Benign    CHOLECYSTECTOMY  1985   ESOPHAGEAL MANOMETRY N/A 10/22/2012   Procedure: ESOPHAGEAL MANOMETRY (EM);  Surgeon: Sable Feil, MD;  Location: [facility];  Service: Endoscopy;  Laterality: N/A;   EXTRACORPOREAL SHOCK WAVE LITHOTRIPSY Left 11/26/2018   Procedure: EXTRACORPOREAL SHOCK WAVE LITHOTRIPSY (ESWL);  Surgeon: Franchot Gallo, MD;  Location: [facility];  Service: Urology;  Laterality: Left;   HEMORRHOID SURGERY     RECTOCELE REPAIR     TUBAL LIGATION       Current Outpatient Medications:    APIXABAN (ELIQUIS) VTE STARTER PACK (10MG  AND 5MG ), Take as directed on package: start with two-5mg  tablets twice daily for 7 days. On day 8, switch to one-5mg  tablet twice daily., Disp: 1 each, Rfl: 0   cetirizine (ZYRTEC) 10 MG tablet, Take 10 mg by mouth daily., Disp: , Rfl:    escitalopram (LEXAPRO) 20 MG tablet, Take 10 mg by mouth daily. , Disp: , Rfl:    fluticasone (FLONASE) 50  MCG/ACT nasal spray, Place 2 sprays into the nose daily., Disp: , Rfl:    furosemide (LASIX) 20 MG tablet, Take 1 tablet (20 mg total) by mouth daily for 10 days., Disp: 10 tablet, Rfl: 0   hyoscyamine (LEVSIN SL) 0.125 MG SL tablet, Place 1 tablet (0.125 mg total) under the tongue every 4 (four) hours as needed., Disp: 30 tablet, Rfl: 0   levothyroxine (SYNTHROID, LEVOTHROID) 75 MCG tablet, Take 75 mcg by mouth daily before breakfast., Disp: , Rfl:    lubiprostone (AMITIZA) 8 MCG capsule, 1 tab every 4-6 hpurs as needed for RLQ pain, Disp: 60 capsule, Rfl: 3   meloxicam (MOBIC) 7.5 MG tablet, Take 7.5 mg by mouth as needed for pain., Disp: , Rfl:    Na Sulfate-K Sulfate-Mg Sulf (SUPREP BOWEL PREP) SOLN, Take 1 kit by mouth once., Disp: 1 Bottle, Rfl: 0   omeprazole-sodium bicarbonate (ZEGERID) 40-1100 MG capsule, Take 1 capsule by mouth daily before breakfast., Disp: , Rfl:    Probiotic Product ([place]), Take by mouth. Every day, Disp: , Rfl:    tamsulosin (FLOMAX) 0.4 MG CAPS capsule, Take 1 capsule (0.4 mg total) by mouth daily after supper., Disp: 14 capsule, Rfl: 0  Vitamin D, Ergocalciferol, (DRISDOL) 1.25 MG (50000 UT) CAPS capsule, Take 50,000 Units by mouth once a week., Disp: , Rfl:   Allergies  Allergen Reactions   Gabapentin     Other Reaction(s): Dizziness, Dizziness  States made her feel like she was drunk. Unsteady gait and dizziness.   States made her feel like she was drunk. Unsteady gait and dizziness.     States made her feel like she was drunk. Unsteady gait and dizziness.  States made her feel like she was drunk. Unsteady gait and dizziness.   Lidocaine Rash   Metoclopramide Rash   Other Other (See Comments)    DYE+Nitrofurantoin+Brilliant Blue Fcf   Sulfa Antibiotics Rash   Z-Pak [Azithromycin] Rash    REACTION: rash   Metoclopramide Hcl     REACTION: a drawing of all her muscles   Penicillins    Procaine Hcl    Macrodantin [Nitrofurantoin  Macrocrystal] Rash          Objective:  Physical Exam  General: AAO x3, NAD  Dermatological: On the left foot since her supply was hyperkeratotic lesion with dried blood.  After debridement there was a superficial granular wound which is pictured below measures 0.4 x 0.2 x 0.2 cm without any probing, undermining or tunneling.  Not able to measure the wound prior to debridement as it was covered with callus.  There is no surrounding erythema, ascending cellulitis.  There is no fluctuation or crepitation.  There is no malodor.  No other open lesions.    Vascular: Dorsalis Pedis artery and Posterior Tibial artery pedal pulses are 2/4 bilateral with immedate capillary fill time.  There is no pain with calf compression, swelling, warmth, erythema.   Neruologic: Grossly intact via light touch bilateral.   Musculoskeletal: Prominence of metatarsal head.  Tenderness along the skin lesion left submetatarsal 5.  No other areas of discomfort.  Gait: Unassisted, Nonantalgic.       Assessment:   82 year old female with ulceration left foot     Plan:  -Treatment options discussed including all alternatives, risks, and complications -Etiology of symptoms were discussed -Medically necessary wound debridement was performed today.  I sharply debrided the hyperkeratotic tissue to remove any nonviable devitalized tissue to promote wound healing with a #312 with scalpel.  Unable to identify the wound underneath this I debrided down to healthy, granular tissue.  There is no purulence or any probing, undermining or tunneling.  There was no blood loss.  I cleaned with saline.  Medihoney was applied followed by dressing.  Continue daily dressing changes. -Offloading  X-ray next appointment if wound continues  Trula Slade DPM

## 2022-08-24 ENCOUNTER — Encounter (HOSPITAL_COMMUNITY): Payer: Self-pay | Admitting: *Deleted

## 2022-08-24 ENCOUNTER — Emergency Department (HOSPITAL_COMMUNITY): Payer: Medicare Other

## 2022-08-24 ENCOUNTER — Emergency Department (HOSPITAL_COMMUNITY)
Admission: EM | Admit: 2022-08-24 | Discharge: 2022-08-24 | Disposition: A | Discharge status: Home / Self Care | Payer: Medicare Other | Attending: Emergency Medicine | Admitting: Emergency Medicine

## 2022-08-24 ENCOUNTER — Other Ambulatory Visit: Payer: Self-pay

## 2022-08-24 DIAGNOSIS — Z7901 Long term (current) use of anticoagulants: Secondary | ICD-10-CM | POA: Diagnosis not present

## 2022-08-24 DIAGNOSIS — M7989 Other specified soft tissue disorders: Secondary | ICD-10-CM

## 2022-08-24 DIAGNOSIS — D649 Anemia, unspecified: Secondary | ICD-10-CM | POA: Insufficient documentation

## 2022-08-24 DIAGNOSIS — E876 Hypokalemia: Secondary | ICD-10-CM | POA: Diagnosis not present

## 2022-08-24 LAB — BASIC METABOLIC PANEL
Anion gap: 7 (ref 5–15)
BUN: 9 mg/dL (ref 8–23)
CO2: 30 mmol/L (ref 22–32)
Calcium: 8.3 mg/dL — ABNORMAL LOW (ref 8.9–10.3)
Chloride: 99 mmol/L (ref 98–111)
Creatinine, Ser: 0.65 mg/dL (ref 0.44–1.00)
GFR, Estimated: >60 mL/min (ref >60)
Glucose, Bld: 160 mg/dL — ABNORMAL HIGH (ref 70–99)
Potassium: 2.9 mmol/L — ABNORMAL LOW (ref 3.5–5.1)
Sodium: 136 mmol/L (ref 135–145)

## 2022-08-24 LAB — CBC
HCT: 31.3 % — ABNORMAL LOW (ref 36.0–46.0)
Hemoglobin: 9.5 g/dL — ABNORMAL LOW (ref 12.0–15.0)
MCH: 27.9 pg (ref 26.0–34.0)
MCHC: 30.4 g/dL (ref 30.0–36.0)
MCV: 92.1 fL (ref 80.0–100.0)
Platelets: 266 10*3/uL (ref 150–400)
RBC: 3.4 MIL/uL — ABNORMAL LOW (ref 3.87–5.11)
RDW: 15 % (ref 11.5–15.5)
WBC: 5.8 10*3/uL (ref 4.0–10.5)
nRBC: 0 % (ref 0.0–0.2)

## 2022-08-24 MED ORDER — DICLOFENAC SODIUM 1 % EX GEL: 2.0000 g | Freq: Four times a day (QID) | CUTANEOUS | 0 refills | Status: AC

## 2022-08-24 MED ORDER — POTASSIUM CHLORIDE CRYS ER 20 MEQ PO TBCR
40.0000 meq | EXTENDED_RELEASE_TABLET | Freq: Once | ORAL | Status: AC
  Administered 2022-08-24: 40 meq via ORAL
  Filled 2022-08-24: qty 2

## 2022-08-24 NOTE — ED Provider Notes (Signed)
[facility] Provider Note   CSN: [id number] Arrival date & time: 08/24/22  1047     History  Chief Complaint  Patient presents with   Foot Swelling    Stefanie [name] is a 82 y.o. female.  HPI   Patient presents ED with complaints of left ankle and calf pain.  Patient has a history of DVT.  She also has history of calluses.  Patient states she has had swelling in both legs for a while but she started having increasing swelling in her left leg in the last few days.  History was provided by both the patient and the caregiver.  The caregiver states that she did have a DVT in her left leg was more swollen but it decreased.  Only recently did increase in size again.  Patient denies any fevers or chills.  No chest pain or shortness of breath.  She describes pain primarily is in the calf area of her left leg but also in the ankle.  Home Medications Prior to Admission medications   Medication Sig Start Date End Date Taking? Authorizing Provider  diclofenac Sodium (VOLTAREN ARTHRITIS PAIN) 1 % GEL Apply 2 g topically 4 (four) times daily for 7 days. 08/24/22 08/31/22 Yes Dorie Rank, MD  escitalopram (LEXAPRO) 20 MG tablet Take 10 mg by mouth daily.    Yes [provider]  furosemide (LASIX) 20 MG tablet Take 1 tablet (20 mg total) by mouth daily for 10 days. 07/05/22 08/24/22 Yes Redwine, Madison A, PA-C  levothyroxine (SYNTHROID, LEVOTHROID) 75 MCG tablet Take 75 mcg by mouth daily before breakfast.   Yes [provider]  potassium chloride (KLOR-CON) 10 MEQ tablet Take 10 mEq by mouth daily. 08/17/22  Yes [provider]  Vitamin D, Ergocalciferol, (DRISDOL) 1.25 MG (50000 UT) CAPS capsule Take 50,000 Units by mouth once a week.   Yes [provider]  APIXABAN Arne [place]) VTE STARTER PACK (10MG  AND 5MG ) Take as directed on package: start with two-5mg  tablets twice daily for 7 days. On day 8, switch to one-5mg  tablet twice  daily. Patient not taking: Reported on 08/24/2022 07/01/22   Noemi Chapel, MD      Allergies    Gabapentin, Lidocaine, Metoclopramide, Other, Sulfa antibiotics, Z-pak [azithromycin], Metoclopramide hcl, Penicillins, Procaine hcl, and Macrodantin [nitrofurantoin macrocrystal]    Review of Systems   Review of Systems  Physical Exam Updated Vital Signs BP 139/60   Pulse 89   Temp 97.9 F (36.6 C) (Oral)   Resp 20   Wt 68 kg   SpO2 100%   BMI 25.75 kg/m  Physical Exam Vitals and nursing note reviewed.  Constitutional:      Appearance: She is well-developed. She is not diaphoretic.  HENT:     Head: Normocephalic and atraumatic.     Right Ear: External ear normal.     Left Ear: External ear normal.  Eyes:     General: No scleral icterus.       Right eye: No discharge.        Left eye: No discharge.     Conjunctiva/sclera: Conjunctivae normal.  Neck:     Trachea: No tracheal deviation.  Cardiovascular:     Rate and Rhythm: Normal rate and regular rhythm.  Pulmonary:     Effort: Pulmonary effort is normal. No respiratory distress.     Breath sounds: Normal breath sounds. No stridor. No wheezing or rales.  Abdominal:     General: Bowel  sounds are normal. There is no distension.     Palpations: Abdomen is soft.     Tenderness: There is no abdominal tenderness. There is no guarding or rebound.  Musculoskeletal:        General: Tenderness present. No deformity.     Cervical back: Neck supple.     Left lower leg: Edema present.     Comments: No erythema or lymphangitic streaking, edema noted in the left calf, tender to palpation, some mild tenderness in the left ankle region as well, status post debridement callus on the lateral aspect of her left foot but there is no erythema fluctuance or drainage  Skin:    General: Skin is warm and dry.     Findings: No rash.  Neurological:     General: No focal deficit present.     Mental Status: She is alert.     Cranial Nerves: No  cranial nerve deficit, dysarthria or facial asymmetry.     Sensory: No sensory deficit.     Motor: No abnormal muscle tone or seizure activity.     Coordination: Coordination normal.  Psychiatric:        Mood and Affect: Mood normal.     ED Results / Procedures / Treatments   Labs (all labs ordered are listed, but only abnormal results are displayed) Labs Reviewed  CBC - Abnormal; Notable for the following components:      Result Value   RBC 3.40 (*)    Hemoglobin 9.5 (*)    HCT 31.3 (*)    All other components within normal limits  BASIC METABOLIC PANEL - Abnormal; Notable for the following components:   Potassium 2.9 (*)    Glucose, Bld 160 (*)    Calcium 8.3 (*)    All other components within normal limits    EKG None  Radiology US Venous Img Lower  Left (DVT Study)  Result Date: 08/24/2022 CLINICAL DATA:  Left lower extremity pain and edema for the past 2 days. History of DVT/SVT. Patient is currently on anticoagulation. Evaluate for DVT. EXAM: LEFT LOWER EXTREMITY VENOUS DOPPLER ULTRASOUND TECHNIQUE: Gray-scale sonography with graded compression, as well as color Doppler and duplex ultrasound were performed to evaluate the lower extremity deep venous systems from the level of the common femoral vein and including the common femoral, femoral, profunda femoral, popliteal and calf veins including the posterior tibial, peroneal and gastrocnemius veins when visible. The superficial great saphenous vein was also interrogated. Spectral Doppler was utilized to evaluate flow at rest and with distal augmentation maneuvers in the common femoral, femoral and popliteal veins. COMPARISON:  Left lower extremity venous Doppler ultrasound-07/05/2022 (positive for SVT involving the left greater saphenous vein extending to the level of the saphenofemoral junction). FINDINGS: Contralateral Common Femoral Vein: Respiratory phasicity is normal and symmetric with the symptomatic side. No evidence of  thrombus. Normal compressibility. Common Femoral Vein: No evidence of thrombus. Normal compressibility, respiratory phasicity and response to augmentation. Saphenofemoral Junction: No evidence of acute or chronic thrombus. Normal compressibility and flow on color Doppler imaging. Profunda Femoral Vein: No evidence of thrombus. Normal compressibility and flow on color Doppler imaging. Femoral Vein: No evidence of thrombus. Normal compressibility, respiratory phasicity and response to augmentation. Popliteal Vein: No evidence of thrombus. Normal compressibility, respiratory phasicity and response to augmentation. Calf Veins: No evidence of thrombus. Normal compressibility and flow on color Doppler imaging. Other Findings:  None. IMPRESSION: No evidence of acute or chronic DVT or SVT within the left lower  extremity with special attention paid to the saphenofemoral junction. Electronically Signed   By: Sandi Mariscal M.D.   On: 08/24/2022 12:07   DG Ankle Complete Left  Result Date: 08/24/2022 CLINICAL DATA:  Pain and swelling EXAM: LEFT ANKLE COMPLETE - 3+ VIEW COMPARISON:  None Available. FINDINGS: No fracture or dislocation is seen. No focal lytic lesions are seen. Osteopenia is seen in the bony structures. There is a metallic anchor in the posterior calcaneus close to the attachment of Achilles tendon. Plantar spur is seen in calcaneus. There is soft tissue swelling around the ankle. Small smoothly marginated calcifications are noted superior to the calcaneus, possibly residual from previous soft tissue injury. IMPRESSION: No acute radiographic abnormalities are seen. Osteopenia is seen in the bony structures. Postsurgical changes are noted in calcaneus. Plantar spur is seen in calcaneus. Electronically Signed   By: Elmer Picker M.D.   On: 08/24/2022 11:52    Procedures Procedures    Medications Ordered in ED Medications  potassium chloride SA (KLOR-CON M) CR tablet 40 mEq (has no administration in  time range)    ED Course/ Medical Decision Making/ A&P Clinical Course as of 08/24/22 1302  Wed Aug 24, 2022  1243 Labs notable for hypokalemia anemia stable [JK]  1243 No evidence of acute or chronic DVT [JK]  1244 X-rays without acute bony abnormalities [JK]    Clinical Course User Index [JK] Dorie Rank, MD                             Medical Decision Making Differential diagnosis includes but not to cellulitis, DVT, arthritis  Problems Addressed: Leg swelling: acute illness or injury that poses a threat to life or bodily functions  Amount and/or Complexity of Data Reviewed External Data Reviewed:     Details: Reviewed images from the treatment she received at the dermatologist.  Appearance today appears improved with appropriate healing Labs: ordered. Radiology: ordered.  Risk Prescription drug management.   Patient's exam is not suggestive of infection.  There is no erythema, there is no increased warmth.  Her Doppler study does not show any evidence of DVT.  She has normal perfusion without evidence to suggest acute arterial occlusion.  X-ray does show evidence of osteoporosis.  There may be a component of arthritis for her pain and discomfort.  She has been taking Tylenol.  Will also have her try Voltaren gel.  Appears stable for outpatient management follow-up        Final Clinical Impression(s) / ED Diagnoses Final diagnoses:  Leg swelling  Hypokalemia    Rx / DC Orders ED Discharge Orders          Ordered    diclofenac Sodium (VOLTAREN ARTHRITIS PAIN) 1 % GEL  4 times daily        08/24/22 1300              Dorie Rank, MD 08/24/22 1302

## 2022-08-24 NOTE — Discharge Instructions (Addendum)
Try wearing compression stocks to help with your swelling.  The ultrasound did not show any evidence of blood clot.  Some of your pain and discomfort may be associated with arthritis changes in your foot and ankle.  Continue with Tylenol and you can also apply the voltaren gel.  Follow-up with your orthopedic doctor or podiatrist for further evaluation.   Your potassium was also low today.  Continue your potassium medication. Follow up with your doctor to have that rechecked.

## 2022-08-24 NOTE — ED Triage Notes (Addendum)
Pt lives at home and caregiver arrived this morning, pt told caregiver that she could not walk last night due to pain.  Swelling bilateral feet and legs but left worse than right. Hx of swelling in the past,this time for a week, pain for past few days.

## 2022-08-27 ENCOUNTER — Encounter (HOSPITAL_COMMUNITY): Payer: Self-pay

## 2022-08-27 ENCOUNTER — Other Ambulatory Visit: Payer: Self-pay

## 2022-08-27 ENCOUNTER — Emergency Department (HOSPITAL_COMMUNITY): Payer: Medicare Other

## 2022-08-27 ENCOUNTER — Emergency Department (HOSPITAL_COMMUNITY)
Admission: EM | Admit: 2022-08-27 | Discharge: 2022-08-27 | Disposition: A | Discharge status: Home / Self Care | Payer: Medicare Other | Attending: Emergency Medicine | Admitting: Emergency Medicine

## 2022-08-27 DIAGNOSIS — Y9301 Activity, walking, marching and hiking: Secondary | ICD-10-CM | POA: Insufficient documentation

## 2022-08-27 DIAGNOSIS — S93402A Sprain of unspecified ligament of left ankle, initial encounter: Secondary | ICD-10-CM

## 2022-08-27 DIAGNOSIS — Z7901 Long term (current) use of anticoagulants: Secondary | ICD-10-CM | POA: Diagnosis not present

## 2022-08-27 DIAGNOSIS — M25572 Pain in left ankle and joints of left foot: Secondary | ICD-10-CM | POA: Diagnosis present

## 2022-08-27 DIAGNOSIS — X501XXA Overexertion from prolonged static or awkward postures, initial encounter: Secondary | ICD-10-CM | POA: Insufficient documentation

## 2022-08-27 MED ORDER — ACETAMINOPHEN 500 MG PO TABS
1000.0000 mg | ORAL_TABLET | Freq: Once | ORAL | Status: AC
  Administered 2022-08-27: 1000 mg via ORAL
  Filled 2022-08-27: qty 2

## 2022-08-27 NOTE — Discharge Instructions (Signed)
You have been seen today for your complaint of left ankle pain. Your imaging was reassuring and showed no abnormalities. Your discharge medications include Tylenol.  You may take up to 1000 mg of Tylenol every 6 hours for pain. Follow up with: Your primary care provider in 1 week for reevaluation Please seek immediate medical care if you develop any of the following symptoms: Your foot or toes become numb or blue. You have severe pain that gets worse. At this time there does not appear to be the presence of an emergent medical condition, however there is always the potential for conditions to change. Please read and follow the below instructions.  Do not take your medicine if  develop an itchy rash, swelling in your mouth or lips, or difficulty breathing; call 911 and seek immediate emergency medical attention if this occurs.  You may review your lab tests and imaging results in their entirety on your MyChart account.  Please discuss all results of fully with your primary care provider and other specialist at your follow-up visit.  Note: Portions of this text may have been transcribed using voice recognition software. Every effort was made to ensure accuracy; however, inadvertent computerized transcription errors may still be present.

## 2022-08-27 NOTE — ED Triage Notes (Signed)
Pt BIB [facility] from home. Seen here recently for DVT. Pt states she stood up to use the restroom, twisted ankle and heard a "pop and crack". Unable to put pressure on L foot at this time. L leg and lateral ankle noted to be swollen and red, warm to tough.   No medications PTA.   Typically uses walker at home.   Pedal pulses found with doppler by Herbert Spires, NT.

## 2022-08-27 NOTE — ED Notes (Signed)
Pt is not willing to attempt to place pressure on left leg. Pt stated that the boot hurt and wanted it off right away.

## 2022-08-27 NOTE — ED Provider Notes (Signed)
[facility] Provider Note   CSN: [id number] Arrival date & time: 08/27/22  1807     History  Chief Complaint  Patient presents with   Ankle Pain    Stefanie [name] is a 82 y.o. female.  With a history of fibromyalgia, arthritis, IBS, anxiety, depression who presents to the ED via EMS for evaluation of left ankle pain.  She was walking to the bathroom with her walker when she turned the corner and twisted her planted foot in place.  She felt a pop and noticed immediate pain.  This occurred just prior to arrival.  She was able to walk to the nearby chair and sit down.  She does report history of DVT but states that she was tested 3 days ago and found to be negative.  She reports history of neuropathy with no worsening tingling or numbness since the incident.  She also reports bilateral knee pain at baseline.  No recent change to this.  She did not fall or hit her head when the incident occurred.   Ankle Pain      Home Medications Prior to Admission medications   Medication Sig Start Date End Date Taking? Authorizing Provider  APIXABAN (ELIQUIS) VTE STARTER PACK (10MG  AND 5MG ) Take as directed on package: start with two-5mg  tablets twice daily for 7 days. On day 8, switch to one-5mg  tablet twice daily. Patient not taking: Reported on 08/24/2022 07/01/22   Noemi Chapel, MD  diclofenac Sodium (VOLTAREN ARTHRITIS PAIN) 1 % GEL Apply 2 g topically 4 (four) times daily for 7 days. 08/24/22 08/31/22  Dorie Rank, MD  escitalopram (LEXAPRO) 20 MG tablet Take 10 mg by mouth daily.     [provider]  furosemide (LASIX) 20 MG tablet Take 1 tablet (20 mg total) by mouth daily for 10 days. 07/05/22 08/24/22  Redwine, Madison A, PA-C  levothyroxine (SYNTHROID, LEVOTHROID) 75 MCG tablet Take 75 mcg by mouth daily before breakfast.    [provider]  potassium chloride (KLOR-CON) 10 MEQ tablet Take 10 mEq by mouth daily. 08/17/22   [provider]  Vitamin D, Ergocalciferol, (DRISDOL) 1.25 MG (50000 UT) CAPS capsule Take 50,000 Units by mouth once a week.    [provider]      Allergies    Gabapentin, Lidocaine, Metoclopramide, Other, Sulfa antibiotics, Z-pak [azithromycin], Metoclopramide hcl, Penicillins, Procaine hcl, and Macrodantin [nitrofurantoin macrocrystal]    Review of Systems   Review of Systems  Musculoskeletal:  Positive for arthralgias.  All other systems reviewed and are negative.   Physical Exam Updated Vital Signs BP 136/61 (BP Location: Right Arm)   Pulse 80   Temp 98.6 F (37 C) (Oral)   Resp 19   Ht 5\' 4"  (1.626 m)   Wt 63 kg   SpO2 98%   BMI 23.86 kg/m  Physical Exam Vitals and nursing note reviewed.  Constitutional:      General: She is not in acute distress.    Appearance: Normal appearance. She is normal weight. She is not ill-appearing.  HENT:     Head: Normocephalic and atraumatic.  Pulmonary:     Effort: Pulmonary effort is normal. No respiratory distress.  Abdominal:     General: Abdomen is flat.  Musculoskeletal:        General: Normal range of motion.     Cervical back: Neck supple.     Comments: Swelling to the left foot and ankle to the level of  the mid calf.  DP pulses 2+ bilaterally.  Capillary refill less than 2 seconds bilaterally.  Sensation intact bilaterally.  Patient has full active and passive range of motion of the left ankle.  Minimal TTP to the plantar surface of the left foot and the lateral malleolus  Skin:    General: Skin is warm and dry.  Neurological:     Mental Status: She is alert and oriented to person, place, and time.  Psychiatric:        Mood and Affect: Mood normal.        Behavior: Behavior normal.     ED Results / Procedures / Treatments   Labs (all labs ordered are listed, but only abnormal results are displayed) Labs Reviewed - No data to display  EKG None  Radiology DG Ankle Complete Left  Result Date:  08/27/2022 CLINICAL DATA:  Foot and ankle pain post fall earlier today EXAM: LEFT ANKLE COMPLETE - 3+ VIEW COMPARISON:  08/24/2022 FINDINGS: Diffuse soft tissue swelling. Ankle joint space preserved. Anchors at posterior calcaneus with posttraumatic versus postsurgical deformity at the dorsal margin of the posterior calcaneus. No acute fracture, dislocation, or bone destruction. IMPRESSION: No acute osseous abnormalities. Postsurgical and suspected old post traumatic changes of the posterior calcaneus. Electronically Signed   By: Lavonia Dana M.D.   On: 08/27/2022 19:27    Procedures Procedures    Medications Ordered in ED Medications  acetaminophen (TYLENOL) tablet 1,000 mg (1,000 mg Oral Given 08/27/22 1949)    ED Course/ Medical Decision Making/ A&P Clinical Course as of 08/27/22 2136  Sat Aug 27, 2022  2051 Patient did ambulate a few steps, however gave very minimal effort.  Will retrial in a few minutes [AS]    Clinical Course User Index [AS] Rogers Ditter, Grafton Folk, PA-C                             Medical Decision Making Amount and/or Complexity of Data Reviewed Radiology: ordered.  Risk OTC drugs.  This patient presents to the ED for concern of left ankle pain, this involves an extensive number of treatment options, and is a complaint that carries with it a high risk of complications and morbidity.  The differential diagnosis includes fracture, strain, sprain  Co morbidities that complicate the patient evaluation  fibromyalgia, arthritis, IBS, anxiety, depression  My initial workup includes x-ray left ankle  Additional history obtained from: Nursing notes from this visit. Previous records within EMR system ED visit for left lower extremity swelling 02/07/2019  I ordered imaging studies including x-ray left ankle I independently visualized and interpreted imaging which showed no acute osseous abnormalities I agree with the radiologist interpretation  Afebrile,  hemodynamically stable.  82 year old female presents ED for evaluation of left ankle pain after twisting her ankle.  On exam she has what appears to be chronic left lower extremity swelling.  She had a negative DVT study 3 days ago.  She has a normal neurovascular exam.  X-ray negative for fractures or dislocations.  She was given Tylenol, cam walker and ambulated in the ED which was observed by myself.  She was encouraged to continue using her walker and weight-bear as tolerated at home.  She was encouraged to continue using Tylenol at home for pain as she has been.  She was encouraged to follow-up with her primary care provider in 1 week for reevaluation.  Stable at discharge.  At this time there does  not appear to be any evidence of an acute emergency medical condition and the patient appears stable for discharge with appropriate outpatient follow up. Diagnosis was discussed with patient who verbalizes understanding of care plan and is agreeable to discharge. I have discussed return precautions with patient who verbalizes understanding. Patient encouraged to follow-up with their PCP within 1 week. All questions answered.  Note: Portions of this report may have been transcribed using voice recognition software. Every effort was made to ensure accuracy; however, inadvertent computerized transcription errors may still be present.        Final Clinical Impression(s) / ED Diagnoses Final diagnoses:  Sprain of left ankle, unspecified ligament, initial encounter    Rx / DC Orders ED Discharge Orders     None         Stefanie [name] 08/27/22 2136    Fredia Sorrow, MD 08/28/22 [phone]

## 2022-08-27 NOTE — ED Notes (Signed)
Ice applied to left ankle.

## 2022-09-29 ENCOUNTER — Encounter (HOSPITAL_BASED_OUTPATIENT_CLINIC_OR_DEPARTMENT_OTHER): Payer: Self-pay | Admitting: *Deleted

## 2022-09-29 ENCOUNTER — Emergency Department (HOSPITAL_BASED_OUTPATIENT_CLINIC_OR_DEPARTMENT_OTHER): Payer: Medicare Other

## 2022-09-29 ENCOUNTER — Other Ambulatory Visit: Payer: Self-pay

## 2022-09-29 ENCOUNTER — Inpatient Hospital Stay (HOSPITAL_BASED_OUTPATIENT_CLINIC_OR_DEPARTMENT_OTHER)
Admission: EM | Admit: 2022-09-29 | Discharge: 2022-10-01 | DRG: 299 | Disposition: A | Discharge status: Home Health | Payer: Medicare Other | Attending: Internal Medicine | Admitting: Internal Medicine

## 2022-09-29 DIAGNOSIS — Z5309 Procedure and treatment not carried out because of other contraindication: Secondary | ICD-10-CM | POA: Diagnosis not present

## 2022-09-29 DIAGNOSIS — Z881 Allergy status to other antibiotic agents status: Secondary | ICD-10-CM

## 2022-09-29 DIAGNOSIS — K5730 Diverticulosis of large intestine without perforation or abscess without bleeding: Secondary | ICD-10-CM | POA: Diagnosis present

## 2022-09-29 DIAGNOSIS — Z83719 Family history of colon polyps, unspecified: Secondary | ICD-10-CM

## 2022-09-29 DIAGNOSIS — I82562 Chronic embolism and thrombosis of left calf muscular vein: Principal | ICD-10-CM | POA: Diagnosis present

## 2022-09-29 DIAGNOSIS — Z87442 Personal history of urinary calculi: Secondary | ICD-10-CM

## 2022-09-29 DIAGNOSIS — K449 Diaphragmatic hernia without obstruction or gangrene: Secondary | ICD-10-CM | POA: Diagnosis present

## 2022-09-29 DIAGNOSIS — K219 Gastro-esophageal reflux disease without esophagitis: Secondary | ICD-10-CM | POA: Diagnosis present

## 2022-09-29 DIAGNOSIS — K2971 Gastritis, unspecified, with bleeding: Secondary | ICD-10-CM | POA: Diagnosis present

## 2022-09-29 DIAGNOSIS — Z79890 Hormone replacement therapy (postmenopausal): Secondary | ICD-10-CM

## 2022-09-29 DIAGNOSIS — I82812 Embolism and thrombosis of superficial veins of left lower extremities: Secondary | ICD-10-CM | POA: Diagnosis present

## 2022-09-29 DIAGNOSIS — E039 Hypothyroidism, unspecified: Secondary | ICD-10-CM | POA: Diagnosis present

## 2022-09-29 DIAGNOSIS — Z880 Allergy status to penicillin: Secondary | ICD-10-CM

## 2022-09-29 DIAGNOSIS — I82412 Acute embolism and thrombosis of left femoral vein: Principal | ICD-10-CM

## 2022-09-29 DIAGNOSIS — M797 Fibromyalgia: Secondary | ICD-10-CM | POA: Diagnosis present

## 2022-09-29 DIAGNOSIS — I82509 Chronic embolism and thrombosis of unspecified deep veins of unspecified lower extremity: Secondary | ICD-10-CM | POA: Diagnosis present

## 2022-09-29 DIAGNOSIS — Z79899 Other long term (current) drug therapy: Secondary | ICD-10-CM

## 2022-09-29 DIAGNOSIS — Z9049 Acquired absence of other specified parts of digestive tract: Secondary | ICD-10-CM

## 2022-09-29 DIAGNOSIS — Z888 Allergy status to other drugs, medicaments and biological substances status: Secondary | ICD-10-CM

## 2022-09-29 DIAGNOSIS — Z86010 Personal history of colonic polyps: Secondary | ICD-10-CM

## 2022-09-29 DIAGNOSIS — D649 Anemia, unspecified: Secondary | ICD-10-CM | POA: Diagnosis present

## 2022-09-29 DIAGNOSIS — Z882 Allergy status to sulfonamides status: Secondary | ICD-10-CM

## 2022-09-29 DIAGNOSIS — F32A Depression, unspecified: Secondary | ICD-10-CM | POA: Diagnosis present

## 2022-09-29 DIAGNOSIS — Z883 Allergy status to other anti-infective agents status: Secondary | ICD-10-CM

## 2022-09-29 DIAGNOSIS — Z90710 Acquired absence of both cervix and uterus: Secondary | ICD-10-CM

## 2022-09-29 DIAGNOSIS — S99919D Unspecified injury of unspecified ankle, subsequent encounter: Secondary | ICD-10-CM

## 2022-09-29 DIAGNOSIS — R195 Other fecal abnormalities: Secondary | ICD-10-CM

## 2022-09-29 LAB — BASIC METABOLIC PANEL
Anion gap: 9 (ref 5–15)
BUN: 9 mg/dL (ref 8–23)
CO2: 28 mmol/L (ref 22–32)
Calcium: 9.1 mg/dL (ref 8.9–10.3)
Chloride: 102 mmol/L (ref 98–111)
Creatinine, Ser: 0.68 mg/dL (ref 0.44–1.00)
GFR, Estimated: >60 mL/min (ref >60)
Glucose, Bld: 115 mg/dL — ABNORMAL HIGH (ref 70–99)
Potassium: 3.6 mmol/L (ref 3.5–5.1)
Sodium: 139 mmol/L (ref 135–145)

## 2022-09-29 LAB — CBC
HCT: 29.1 % — ABNORMAL LOW (ref 36.0–46.0)
Hemoglobin: 8.8 g/dL — ABNORMAL LOW (ref 12.0–15.0)
MCH: 26.7 pg (ref 26.0–34.0)
MCHC: 30.2 g/dL (ref 30.0–36.0)
MCV: 88.4 fL (ref 80.0–100.0)
Platelets: 315 10*3/uL (ref 150–400)
RBC: 3.29 MIL/uL — ABNORMAL LOW (ref 3.87–5.11)
RDW: 15.5 % (ref 11.5–15.5)
WBC: 6.9 10*3/uL (ref 4.0–10.5)
nRBC: 0 % (ref 0.0–0.2)

## 2022-09-29 LAB — BRAIN NATRIURETIC PEPTIDE: B Natriuretic Peptide: 65.8 pg/mL (ref 0.0–100.0)

## 2022-09-29 LAB — OCCULT BLOOD X 1 CARD TO LAB, STOOL: Fecal Occult Bld: POSITIVE — AB

## 2022-09-29 MED ORDER — HEPARIN (PORCINE) 25000 UT/250ML-% IV SOLN
1100.0000 [IU]/h | INTRAVENOUS | Status: DC
  Administered 2022-09-29: 1100 [IU]/h via INTRAVENOUS
  Filled 2022-09-29: qty 250

## 2022-09-29 MED ORDER — ACETAMINOPHEN 325 MG PO TABS
325.0000 mg | ORAL_TABLET | Freq: Once | ORAL | Status: AC
  Administered 2022-09-29: 325 mg via ORAL
  Filled 2022-09-29: qty 1

## 2022-09-29 NOTE — ED Notes (Signed)
Meal given

## 2022-09-29 NOTE — Progress Notes (Addendum)
Called regarding consideration for IVC filter placement for DVT with positive fecal occult blood test.   Recent ultrasound demonstrates discordant data with the read noting 'Femoral Vein: No evidence of nonocclusive thrombus with abnormal compressibility, respiratory phasicity and response to augmentation, however the conclusion notes: Nonocclusive DVT within the LEFT femoral vein.   Pt does not have anemia, no frankly bloody BM.  Fecal occult blood test can be positive for a variety of reasons including trauma during test.   Would benefit from admission, heparin ggt, serial h/h checks. Repeat U/S v calling ultrasound tech in the AM regarding results.  Should the DVT be present and she should she fail anticoagulation (hct that trends down), pt would be a candidate for IVC filter placement. IVC placement would necessitate GI work up with plans to resume [facility] once a bleeding source is located at controlled.   IVC filter placement is a bridge to anticoagulation and would require subsequent removal.  Will follow and happy to assist should she require filter placement. Please make NPO midnight.   Victorino Sparrow MD

## 2022-09-29 NOTE — ED Notes (Signed)
Mepilex applied to left foot wound.

## 2022-09-29 NOTE — ED Provider Notes (Addendum)
[place] EMERGENCY DEPARTMENT AT [facility] Provider Note   CSN: [id number] Arrival date & time: 09/29/22  1616     History  Chief Complaint  Patient presents with   Leg Swelling    Stefanie [name] is a 82 y.o. 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  Patient recently twisted her ankle last month and has been in the cam boot since the monitor PCP who noted that her right leg appears swollen and warm and that her left leg appears swollen as well and was referred to ED to rule out DVTs.  Patient's friend was present to assist in history.  Patient's friend stated that this morning her legs appeared more swollen however the swelling is gone down.  Patient denies any pain with the swelling and states she can feel distally.  Patient denied chest pain, shortness of breath, recent surgeries/hospitalizations, hemoptysis, hematochezia, hematemesis, fevers, history of heart failure,  Home Medications Prior to Admission medications   Medication Sig Start Date End Date Taking? Authorizing Provider  APIXABAN (ELIQUIS) VTE STARTER PACK (  AND ) Take as directed on package: start with two-5mg  tablets twice daily for 7 days. On day 8, switch to one-5mg  tablet twice daily. Patient not taking: Reported on 08/24/2022 07/01/22   Eber Hong, MD  escitalopram (LEXAPRO) 20 MG tablet Take 10 mg by mouth daily.     [provider]  furosemide (LASIX) 20 MG tablet Take 1 tablet (20 mg total) by mouth daily for 10 days. 07/05/22 08/24/22  Redwine, Madison A, PA-C  levothyroxine (SYNTHROID, LEVOTHROID) 75 MCG tablet Take 75 mcg by mouth daily before breakfast.    [provider]  potassium chloride (KLOR-CON) 10 MEQ tablet Take 10 mEq by mouth daily. 08/17/22   [provider]  Vitamin D, Ergocalciferol, (DRISDOL) 1.25 MG (50000 UT) CAPS capsule Take 50,000 Units by mouth once a week.    [provider]       Allergies    Gabapentin, Lidocaine, Metoclopramide, Other, Sulfa antibiotics, Z-pak [azithromycin], Metoclopramide hcl, Penicillins, Procaine hcl, and Macrodantin [nitrofurantoin macrocrystal]    Review of Systems   Review of Systems See HPI Physical Exam Updated Vital Signs BP (!) 137/54 (BP Location: Right Arm)   Pulse 78   Temp 98.9 F (37.2 C)   Resp 16   SpO2 98%  Physical Exam Constitutional:      General: She is not in acute distress. Cardiovascular:     Comments: 2+ bilateral dorsalis pedal pulses with regular rate Musculoskeletal:     Comments: Right leg: Edema around calf/proximal tibia region however no edema around the ankle but no skin color changes Left leg: Edematous from knee to foot, slightly pale, warm to the touch Compartments soft  Skin:    General: Skin is warm and dry.     Capillary Refill: Capillary refill takes less than 2 seconds.     Comments: Callus noted on plantar aspect at the base of the fifth digit on left foot  Neurological:     Mental Status: She is alert.     Comments: Sensation intact distally     ED Results / Procedures / Treatments   Labs (all labs ordered are listed, but only abnormal results are displayed) Labs Reviewed  CBC - Abnormal; Notable for the following components:      Result Value   RBC 3.29 (*)    Hemoglobin 8.8 (*)    HCT 29.1 (*)  All other components within normal limits  BASIC METABOLIC PANEL - Abnormal; Notable for the following components:   Glucose, Bld 115 (*)    All other components within normal limits  OCCULT BLOOD X 1 CARD TO LAB, STOOL - Abnormal; Notable for the following components:   Fecal Occult Bld POSITIVE (*)    All other components within normal limits  BRAIN NATRIURETIC PEPTIDE    EKG None  Radiology US Venous Img Lower Bilateral (DVT)  Result Date: 09/29/2022 CLINICAL DATA:  Bilateral lower extremity edema. EXAM: BILATERAL LOWER EXTREMITY VENOUS DOPPLER ULTRASOUND TECHNIQUE:  Gray-scale sonography with graded compression, as well as color Doppler and duplex ultrasound were performed to evaluate the lower extremity deep venous systems from the level of the common femoral vein and including the common femoral, femoral, profunda femoral, popliteal and calf veins including the posterior tibial, peroneal and gastrocnemius veins when visible. The superficial great saphenous vein was also interrogated. Spectral Doppler was utilized to evaluate flow at rest and with distal augmentation maneuvers in the common femoral, femoral and popliteal veins. COMPARISON:  August 24, 2022 FINDINGS: RIGHT LOWER EXTREMITY Common Femoral Vein: No evidence of thrombus. Normal compressibility, respiratory phasicity and response to augmentation. Saphenofemoral Junction: No evidence of thrombus. Normal compressibility and flow on color Doppler imaging. Profunda Femoral Vein: No evidence of thrombus. Normal compressibility and flow on color Doppler imaging. Femoral Vein: No evidence of thrombus. Normal compressibility, respiratory phasicity and response to augmentation. Popliteal Vein: No evidence of thrombus. Normal compressibility, respiratory phasicity and response to augmentation. Calf Veins: No evidence of thrombus. Normal compressibility and flow on color Doppler imaging. Superficial Great Saphenous Vein: No evidence of thrombus. Normal compressibility. Venous Reflux:  None. Other Findings:  None. LEFT LOWER EXTREMITY Common Femoral Vein: No evidence of thrombus. Normal compressibility, respiratory phasicity and response to augmentation. Saphenofemoral Junction: No evidence of thrombus. Normal compressibility and flow on color Doppler imaging. Profunda Femoral Vein: No evidence of thrombus. Normal compressibility and flow on color Doppler imaging. Femoral Vein: No evidence of nonocclusive thrombus with abnormal compressibility, respiratory phasicity and response to augmentation. Popliteal Vein: No evidence of  thrombus. Normal compressibility, respiratory phasicity and response to augmentation. Calf Veins: No evidence of thrombus. Normal compressibility and flow on color Doppler imaging. Superficial Great Saphenous Vein: No evidence of thrombus. Normal compressibility. Venous Reflux:  None. Other Findings:  None. IMPRESSION: 1. Nonocclusive DVT within the LEFT femoral vein. 2. No evidence of DVT within the RIGHT lower extremity. Electronically Signed   By: Aram Candela M.D.   On: 09/29/2022 20:34    Procedures .Critical Care  Performed by: Netta Corrigan, PA-C Authorized by: Netta Corrigan, PA-C   Critical care provider statement:    Critical care time (minutes):  40   Critical care time was exclusive of:  Separately billable procedures and treating other patients   Critical care was necessary to treat or prevent imminent or life-threatening deterioration of the following conditions:  Circulatory failure   Critical care was time spent personally by me on the following activities:  Development of treatment plan with patient or surrogate, discussions with consultants, evaluation of patient's response to treatment, examination of patient, obtaining history from patient or surrogate, review of old charts, re-evaluation of patient's condition, pulse oximetry, ordering and review of radiographic studies, ordering and review of laboratory studies and ordering and performing treatments and interventions   I assumed direction of critical care for this patient from another provider in my specialty: no  Care discussed with: admitting provider       Medications Ordered in ED Medications - No data to display  ED Course/ Medical Decision Making/ A&P                             Medical Decision Making Amount and/or Complexity of Data Reviewed Labs: ordered.  Risk Decision regarding hospitalization.   Stefanie [name] 82 y.o. presented today for bilateral leg edema. Working DDx that I considered at  this time includes, but not limited to, DVT, CHF exacerbation, varicose veins, acute on chronic leg edema, cellulitis, compartment syndrome, ischemic limb.  R/o DDx: CHF exacerbation, varicose veins, acute on chronic leg edema, cellulitis, compartment syndrome, ischemic limb: These are considered less likely due to history of present illness and physical exam findings  Review of prior external notes: 08/27/2022 ED  Unique Tests and My Interpretation:  CBC: Hemoglobin 8.8 which is down from previous result of 9.5  one month ago BMP: Unremarkable BNP: Unremarkable Ultrasound venous bilateral DVT study: Nonoccluding left femoral vein DVT FOBT: Positive  Discussion with Independent Historian:  Friend  Discussion of Management of Tests:  Mansy, MD Hospitalist  and Gerarda Fraction, MD Vascular  Risk: High: hospitalization or escalation of hospital-level care  Risk Stratification Score: none  Staffed with Fredderick Phenix, MD  Plan: Patient presented for bilateral leg edema. On exam patient was in no acute distress but did have edema and both legs with the left leg.  More edematous than the other.  Patient had soft compartments with good pulses motor sensation distally.  Patient is no longer on Eliquis that she was given back in January she finished it but believes she has another blood clot.  Patient's hemoglobin came back at 8.8 which is down from the previous 2 results.  Hemoglobin seems to be trending down however patient denied any hematemesis, hematochezia, melena.  Fecal occult blood test had brown stool with no obvious blood but will be sent to lab. Patient's ultrasound came back for a nonoccluding DVT in her left femoral vein.  After speaking with Dr. Fredderick Phenix we agreed to start the patient on heparin and that patient will need admission with serial hemoglobins.  Patient was updated of this plan and verbalized agreement.  Patient's fecal occult test came back positive.  Heparin was discontinued as we  are hesitant to give anticoagulation to an occult bleed with downtrending hemoglobin.  Patient denies any abdominal pain and patient's stool was not bright red that would suggest a CT scan at this time.  I talked to the hospitalist about possible IVC for patient's DVT or if they feel comfortable with [place] ordering heparin with serial hemoglobins.  Patient stable at this time.  I spoke to the hospitalist and hospitalist stated that patient does not need anticoagulation and agreed with IVC.  Hospitalist stated that they would only admit the patient after I spoke with vascular so that they are on board with the IVC.  Vascular will be consulted.  Vascular was consulted and stated that they would not do an IVC until patient has failed anticoagulation but that he is happy to see the patient in the morning.  Hospitalist was updated of this and agreed to go with this plan.  Heparin will be ordered and patient will be admitted with serial H&H.         Final Clinical Impression(s) / ED Diagnoses Final diagnoses:  Acute deep vein thrombosis (DVT) of  femoral vein of left lower extremity [facility])    Rx / DC Orders ED Discharge Orders     None         Remi Deter 09/29/22 2224    Rolan Bucco, MD 09/29/22 2308

## 2022-09-29 NOTE — ED Notes (Signed)
Report given to the next RN... 

## 2022-09-29 NOTE — Progress Notes (Signed)
ANTICOAGULATION CONSULT NOTE - Initial Consult  Pharmacy Consult for heparin Indication: DVT  Allergies  Allergen Reactions   Gabapentin     Other Reaction(s): Dizziness, Dizziness  States made her feel like she was drunk. Unsteady gait and dizziness.   States made her feel like she was drunk. Unsteady gait and dizziness.     States made her feel like she was drunk. Unsteady gait and dizziness.  States made her feel like she was drunk. Unsteady gait and dizziness.   Lidocaine Rash   Metoclopramide Rash   Other Other (See Comments)    DYE+Nitrofurantoin+Brilliant Blue Fcf   Sulfa Antibiotics Rash   Z-Pak [Azithromycin] Rash    REACTION: rash   Metoclopramide Hcl     REACTION: a drawing of all her muscles   Penicillins    Procaine Hcl    Macrodantin [Nitrofurantoin Macrocrystal] Rash    Patient Measurements:   Heparin Dosing Weight: TBW  Vital Signs: Temp: 98.9 F (37.2 C) (04/25 1657) BP: 137/54 (04/25 2025) Pulse Rate: 78 (04/25 2025)  Labs: Recent Labs    09/29/22 1710  HGB 8.8*  HCT 29.1*  PLT 315  CREATININE 0.68    CrCl cannot be calculated (Unknown ideal weight.).   Medical History: Past Medical History:  Diagnosis Date   Anxiety    Arthritis    Colon polyps    Depression    Diverticulosis of colon (without mention of hemorrhage)    Fibromyalgia    GERD (gastroesophageal reflux disease)    Hiatal hernia    History of IBS    History of kidney stones    Hypothyroid    Hypothyroidism     Assessment: 56 YOF presenting with leg edema, ultrasound with nonoccluding DVT, also with fecal occult positive.  She has been on Eliquis in past but no longer taking  Goal of Therapy:  Heparin level 0.3-0.7 units/ml Monitor platelets by anticoagulation protocol: Yes   Plan:  Heparin gtt at 1100 units/hr, no bolus F/u 8 hour heparin level F/u long term AC plan and VVS recs  Daylene Posey, PharmD, [facility] Clinical Pharmacist ED Pharmacist Phone #  [phone] 09/29/2022 10:28 PM

## 2022-09-29 NOTE — ED Triage Notes (Signed)
Pt with edema in BLE. Left greater than right.  Pt has been seen by her PCP who advised come here to r/o DVT.

## 2022-09-29 NOTE — ED Notes (Signed)
Stefanie [name] [phone] cell phone. Call for updates.

## 2022-09-29 NOTE — Progress Notes (Signed)
Plan of Care Note for accepted transfer   Patient: Stefanie [name] MRN: [id number]   DOA: 09/29/2022  Facility requesting transfer: [facility] Requesting Provider: Cyndee Brightly Reason for transfer: Left femoral vein nonocclusive acute DVT in the setting of worsening anemia and positive stool Hemoccult. Facility course: Stefanie [name] is a 82 y.o. 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  Patient recently twisted her ankle last month and has been in the cam boot since the monitor PCP who noted that her right leg appears swollen and warm and that her left leg appears swollen as well and was referred to ED to rule out DVTs.  Patient's friend was present to assist in history.  Patient's friend stated that this morning her legs appeared more swollen however the swelling is gone down.  Patient denies any pain with the swelling and states she can feel distally.   Patient denied chest pain, shortness of breath, recent surgeries/hospitalizations, hemoptysis, hematochezia, hematemesis, fevers, history of heart failure.  Upon presentation to the emergency room, BP was 146/76 with otherwise normal vital signs.  Labs revealed hemoglobin of 8.8 and hematocrit 29.1 compared to 9.5 and 31.3 on 08/24/2022 with otherwise unremarkable BMP.  Fecal occult blood came back positive with no gross blood.  Bilateral lower extremity venous Doppler revealed nonocclusive DVT within the left femoral vein with no other abnormal findings on the right side.  Nolon Bussing, MD with vascular was contacted and recommended IV heparin before an IVC filter can be placed.  They will see the patient in the morning.  Plan of care: The patient is accepted for admission to Telemetry unit, at [facility]..   The patient will be under the care and responsibility of the ER provider until arrival to [facility].  Author: Hannah Beat, MD 09/29/2022  Check [email or web address] for  on-call coverage.  Nursing staff, Please call [facility] Admits & Consults System-Wide number on Amion as soon as patient's arrival, so appropriate admitting provider can evaluate the pt.

## 2022-09-30 ENCOUNTER — Encounter (HOSPITAL_COMMUNITY)
Admission: EM | Disposition: A | Discharge status: Home Health | Payer: Self-pay | Source: Home / Self Care | Attending: Internal Medicine

## 2022-09-30 ENCOUNTER — Inpatient Hospital Stay (HOSPITAL_COMMUNITY): Payer: Medicare Other

## 2022-09-30 DIAGNOSIS — I10 Essential (primary) hypertension: Secondary | ICD-10-CM | POA: Diagnosis not present

## 2022-09-30 DIAGNOSIS — Z86010 Personal history of colonic polyps: Secondary | ICD-10-CM | POA: Diagnosis not present

## 2022-09-30 DIAGNOSIS — Z79899 Other long term (current) drug therapy: Secondary | ICD-10-CM | POA: Diagnosis not present

## 2022-09-30 DIAGNOSIS — M7989 Other specified soft tissue disorders: Secondary | ICD-10-CM | POA: Diagnosis not present

## 2022-09-30 DIAGNOSIS — D649 Anemia, unspecified: Secondary | ICD-10-CM

## 2022-09-30 DIAGNOSIS — F32A Depression, unspecified: Secondary | ICD-10-CM | POA: Diagnosis not present

## 2022-09-30 DIAGNOSIS — I82812 Embolism and thrombosis of superficial veins of left lower extremities: Secondary | ICD-10-CM | POA: Diagnosis present

## 2022-09-30 DIAGNOSIS — M797 Fibromyalgia: Secondary | ICD-10-CM | POA: Diagnosis present

## 2022-09-30 DIAGNOSIS — I82412 Acute embolism and thrombosis of left femoral vein: Secondary | ICD-10-CM

## 2022-09-30 DIAGNOSIS — Z90710 Acquired absence of both cervix and uterus: Secondary | ICD-10-CM | POA: Diagnosis not present

## 2022-09-30 DIAGNOSIS — I82509 Chronic embolism and thrombosis of unspecified deep veins of unspecified lower extremity: Secondary | ICD-10-CM

## 2022-09-30 DIAGNOSIS — Z87442 Personal history of urinary calculi: Secondary | ICD-10-CM | POA: Diagnosis not present

## 2022-09-30 DIAGNOSIS — Z881 Allergy status to other antibiotic agents status: Secondary | ICD-10-CM | POA: Diagnosis not present

## 2022-09-30 DIAGNOSIS — I82562 Chronic embolism and thrombosis of left calf muscular vein: Secondary | ICD-10-CM | POA: Diagnosis present

## 2022-09-30 DIAGNOSIS — E039 Hypothyroidism, unspecified: Secondary | ICD-10-CM | POA: Diagnosis present

## 2022-09-30 DIAGNOSIS — R195 Other fecal abnormalities: Secondary | ICD-10-CM | POA: Diagnosis not present

## 2022-09-30 DIAGNOSIS — K5730 Diverticulosis of large intestine without perforation or abscess without bleeding: Secondary | ICD-10-CM | POA: Diagnosis present

## 2022-09-30 DIAGNOSIS — Z888 Allergy status to other drugs, medicaments and biological substances status: Secondary | ICD-10-CM | POA: Diagnosis not present

## 2022-09-30 DIAGNOSIS — K449 Diaphragmatic hernia without obstruction or gangrene: Secondary | ICD-10-CM | POA: Diagnosis present

## 2022-09-30 DIAGNOSIS — K2971 Gastritis, unspecified, with bleeding: Secondary | ICD-10-CM | POA: Diagnosis present

## 2022-09-30 DIAGNOSIS — Z79890 Hormone replacement therapy (postmenopausal): Secondary | ICD-10-CM | POA: Diagnosis not present

## 2022-09-30 DIAGNOSIS — Z83719 Family history of colon polyps, unspecified: Secondary | ICD-10-CM | POA: Diagnosis not present

## 2022-09-30 DIAGNOSIS — Z880 Allergy status to penicillin: Secondary | ICD-10-CM | POA: Diagnosis not present

## 2022-09-30 DIAGNOSIS — Z9049 Acquired absence of other specified parts of digestive tract: Secondary | ICD-10-CM | POA: Diagnosis not present

## 2022-09-30 DIAGNOSIS — Z883 Allergy status to other anti-infective agents status: Secondary | ICD-10-CM | POA: Diagnosis not present

## 2022-09-30 DIAGNOSIS — K219 Gastro-esophageal reflux disease without esophagitis: Secondary | ICD-10-CM | POA: Diagnosis present

## 2022-09-30 DIAGNOSIS — F418 Other specified anxiety disorders: Secondary | ICD-10-CM | POA: Diagnosis not present

## 2022-09-30 DIAGNOSIS — Z882 Allergy status to sulfonamides status: Secondary | ICD-10-CM | POA: Diagnosis not present

## 2022-09-30 DIAGNOSIS — S99919D Unspecified injury of unspecified ankle, subsequent encounter: Secondary | ICD-10-CM | POA: Diagnosis not present

## 2022-09-30 DIAGNOSIS — Z5309 Procedure and treatment not carried out because of other contraindication: Secondary | ICD-10-CM | POA: Diagnosis not present

## 2022-09-30 LAB — HEMOGLOBIN AND HEMATOCRIT, BLOOD
HCT: 26.1 % — ABNORMAL LOW (ref 36.0–46.0)
HCT: 26.5 % — ABNORMAL LOW (ref 36.0–46.0)
HCT: 27.3 % — ABNORMAL LOW (ref 36.0–46.0)
HCT: 25 % — ABNORMAL LOW (ref 36.0–46.0)
Hemoglobin: 8.1 g/dL — ABNORMAL LOW (ref 12.0–15.0)
Hemoglobin: 8.1 g/dL — ABNORMAL LOW (ref 12.0–15.0)
Hemoglobin: 8 g/dL — ABNORMAL LOW (ref 12.0–15.0)
Hemoglobin: 7.9 g/dL — ABNORMAL LOW (ref 12.0–15.0)

## 2022-09-30 LAB — COMPREHENSIVE METABOLIC PANEL
ALT: 10 U/L (ref 0–44)
AST: 14 U/L — ABNORMAL LOW (ref 15–41)
Albumin: 3.1 g/dL — ABNORMAL LOW (ref 3.5–5.0)
Alkaline Phosphatase: 105 U/L (ref 38–126)
Anion gap: 6 (ref 5–15)
BUN: 6 mg/dL — ABNORMAL LOW (ref 8–23)
CO2: 27 mmol/L (ref 22–32)
Calcium: 8.2 mg/dL — ABNORMAL LOW (ref 8.9–10.3)
Chloride: 106 mmol/L (ref 98–111)
Creatinine, Ser: 0.73 mg/dL (ref 0.44–1.00)
GFR, Estimated: >60 mL/min (ref >60)
Glucose, Bld: 98 mg/dL (ref 70–99)
Potassium: 3.8 mmol/L (ref 3.5–5.1)
Sodium: 139 mmol/L (ref 135–145)
Total Bilirubin: 0.6 mg/dL (ref 0.3–1.2)
Total Protein: 6.2 g/dL — ABNORMAL LOW (ref 6.5–8.1)

## 2022-09-30 LAB — HEPARIN LEVEL (UNFRACTIONATED): Heparin Unfractionated: 0.3 IU/mL (ref 0.30–0.70)

## 2022-09-30 SURGERY — IVC FILTER INSERTION
Anesthesia: LOCAL

## 2022-09-30 MED ORDER — ONDANSETRON HCL 4 MG PO TABS: 4.0000 mg | ORAL_TABLET | Freq: Four times a day (QID) | ORAL | Status: DC | PRN

## 2022-09-30 MED ORDER — HYDROCODONE-ACETAMINOPHEN 5-325 MG PO TABS: 1.0000 | ORAL_TABLET | Freq: Four times a day (QID) | ORAL | Status: DC | PRN

## 2022-09-30 MED ORDER — SODIUM CHLORIDE 0.9 % IV SOLN: INTRAVENOUS | Status: DC

## 2022-09-30 MED ORDER — ONDANSETRON HCL 4 MG/2ML IJ SOLN: 4.0000 mg | Freq: Four times a day (QID) | INTRAMUSCULAR | Status: DC | PRN

## 2022-09-30 MED ORDER — ESCITALOPRAM OXALATE 10 MG PO TABS
20.0000 mg | ORAL_TABLET | Freq: Every day | ORAL | Status: DC
  Administered 2022-09-30 (×2): 20 mg via ORAL
  Filled 2022-09-30 (×2): qty 2

## 2022-09-30 MED ORDER — LACTATED RINGERS IV SOLN: INTRAVENOUS | Status: DC

## 2022-09-30 MED ORDER — LEVOTHYROXINE SODIUM 75 MCG PO TABS
75.0000 ug | ORAL_TABLET | Freq: Every day | ORAL | Status: DC
  Administered 2022-09-30 – 2022-10-01 (×2): 75 ug via ORAL
  Filled 2022-09-30 (×2): qty 1

## 2022-09-30 MED ORDER — ACETAMINOPHEN 325 MG PO TABS: 650.0000 mg | ORAL_TABLET | Freq: Four times a day (QID) | ORAL | Status: DC | PRN

## 2022-09-30 MED ORDER — ACETAMINOPHEN 650 MG RE SUPP: 650.0000 mg | Freq: Four times a day (QID) | RECTAL | Status: DC | PRN

## 2022-09-30 NOTE — Assessment & Plan Note (Signed)
Continue synthroid 75 mcg qhs

## 2022-09-30 NOTE — Consult Note (Signed)
     Consultation  Referring Provider:  [facility]  Primary Care Physician:  Bouska, David, MD Primary Gastroenterologist:  [facility]        Reason for Consultation:     anemia, heme positive stool  LOS: 0 days          HPI:   Stefanie [name] is a 81 y.o. female with past medical history significant for hypothyroidism, GERD, anxiety/depression, fibromyalgia, arthritis, superficial thrombosis (January 2024), treated history of DVT with Eliquis (no longer on Eliquis), presents for evaluation of anemia and heme positive stool.  Patient initially presented to ED with left leg swelling for several days.  Lower extremity ultrasound was positive for DVT in left femoral vein. Patient underwent repeat ultrasound, no DVT appreciated in left femoral vein.  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  Vascular surgery recommended no need for IVC filter and no need for anticoagulation. Heparin was stopped.  Patient's Hgb dropped from 9.5 (1 month ago) to 8.8 yesterday.  Appears hemoglobin is 10.2,  3 months ago.  No overt bleeding.  Patient denies melena/hematochezia.  Fecal occult was positive.  Patient states last colonoscopy was more than 10 years ago, reports it being normal.  Denies family history of colon cancer.  Denies nausea/vomiting.  Reports chronic abdominal discomfort, unchanged.  Denies weight loss. Denies NSAID use, tobacco, alcohol use.  Reports increased depression lately due to loss of her only child (daughter) July 2023 at age 56.  Patient would like to be out of hospital by her birthday on Sunday.  Past Medical History:  Diagnosis Date   Anxiety    Arthritis    Colon polyps    Depression    Diverticulosis of colon (without mention of hemorrhage)    Fibromyalgia    GERD (gastroesophageal reflux disease)    Hiatal hernia    History of IBS    History of kidney stones    Hypothyroid    Hypothyroidism     Surgical History:  She  has a past surgical history that includes Hemorrhoid surgery;  Tubal ligation; Abdominal hysterectomy; Appendectomy (1985); Ankle surgery; Rectocele repair; Esophageal manometry (N/A, 10/22/2012); Cholecystectomy (1985); Breast biopsy (Left); and Extracorporeal shock wave lithotripsy (Left, 11/26/2018). Family History:  Her family history includes Colon polyps in her brother. Social History:   reports that she has never smoked. She has never used smokeless tobacco. She reports that she does not drink alcohol and does not use drugs.  Prior to Admission medications   Medication Sig Start Date End Date Taking? Authorizing Provider  acetaminophen (TYLENOL) 500 MG tablet Take 1,000 mg by mouth as needed for moderate pain.   Yes [provider]  escitalopram (LEXAPRO) 20 MG tablet Take 20 mg by mouth daily.   Yes [provider]  levothyroxine (SYNTHROID, LEVOTHROID) 75 MCG tablet Take 75 mcg by mouth at bedtime.   Yes [provider]  potassium chloride (KLOR-CON) 10 MEQ tablet Take 10 mEq by mouth daily. 08/17/22  Yes [provider]  apixaban (ELIQUIS) 5 MG TABS tablet Take 5 mg by mouth 2 (two) times daily. Patient not taking: Reported on 09/30/2022    [provider]  APIXABAN (ELIQUIS) VTE STARTER PACK (10MG AND 5MG) Take as directed on package: start with two-5mg tablets twice daily for 7 days. On day 8, switch to one-5mg tablet twice daily. Patient not taking: Reported on 08/24/2022 07/01/22   Miller, Brian, MD  furosemide (LASIX) 20 MG tablet Take 1 tablet (20 mg total) by   mouth daily for 10 days. Patient not taking: Reported on 09/30/2022 07/05/22 08/24/22  Redwine, Madison A, PA-C  Vitamin D, Ergocalciferol, (DRISDOL) 1.25 MG (50000 UT) CAPS capsule Take 50,000 Units by mouth once a week. Patient not taking: Reported on 09/30/2022    [provider]    Current Facility-Administered Medications  Medication Dose Route Frequency Provider Last Rate Last Admin   acetaminophen (TYLENOL) tablet 650 mg  650 mg  Oral Q6H PRN Chen, Eric, DO       Or   acetaminophen (TYLENOL) suppository 650 mg  650 mg Rectal Q6H PRN Chen, Eric, DO       escitalopram (LEXAPRO) tablet 20 mg  20 mg Oral QHS Chen, Eric, DO   20 mg at 09/30/22 0348   heparin ADULT infusion 100 units/mL (25000 units/250mL)  1,100 Units/hr Intravenous Continuous Chen, Eric, DO 11 mL/hr at 09/30/22 0102 1,100 Units/hr at 09/30/22 0102   HYDROcodone-acetaminophen (NORCO/VICODIN) 5-325 MG per tablet 1 tablet  1 tablet Oral Q6H PRN Chen, Eric, DO       lactated ringers infusion   Intravenous Continuous Chen, Eric, DO 50 mL/hr at 09/30/22 0556 New Bag at 09/30/22 0556   levothyroxine (SYNTHROID) tablet 75 mcg  75 mcg Oral Q0600 Chen, Eric, DO   75 mcg at 09/30/22 0553   ondansetron (ZOFRAN) tablet 4 mg  4 mg Oral Q6H PRN Chen, Eric, DO       Or   ondansetron (ZOFRAN) injection 4 mg  4 mg Intravenous Q6H PRN Chen, Eric, DO        Allergies as of 09/29/2022 - Review Complete 09/29/2022  Allergen Reaction Noted   Gabapentin  04/30/2021   Lidocaine Rash 08/26/2015   Metoclopramide Rash 08/26/2015   Other Other (See Comments) 11/26/2018   Sulfa antibiotics Rash 08/26/2015   Z-pak [azithromycin] Rash 04/08/2009   Metoclopramide hcl  04/08/2009   Penicillins  04/08/2009   Procaine hcl  04/08/2009   Macrodantin [nitrofurantoin macrocrystal] Rash 11/21/2018    Review of Systems  Constitutional:  Negative for chills, fever and weight loss.  HENT:  Negative for hearing loss and tinnitus.   Eyes:  Negative for blurred vision and double vision.  Respiratory:  Negative for cough and hemoptysis.   Cardiovascular:  Negative for chest pain and palpitations.  Gastrointestinal:  Negative for abdominal pain, blood in stool, constipation, diarrhea, heartburn, melena, nausea and vomiting.  Genitourinary:  Negative for dysuria and urgency.  Musculoskeletal:  Negative for myalgias and neck pain.  Skin:  Negative for itching and rash.  Neurological:   Negative for seizures and loss of consciousness.  Psychiatric/Behavioral:  Negative for depression and suicidal ideas.        Physical Exam:  Vital signs in last 24 hours: Temp:  [97.8 F (36.6 C)-98.9 F (37.2 C)] 97.8 F (36.6 C) (04/26 0818) Pulse Rate:  [70-83] 76 (04/26 0818) Resp:  [16-24] 17 (04/26 0818) BP: (101-138)/(47-73) 101/47 (04/26 0818) SpO2:  [92 %-100 %] 96 % (04/26 0818) Last BM Date : 09/29/22 Last BM recorded by nurses in past 5 days No data recorded  Physical Exam Constitutional:      General: She is not in acute distress.    Appearance: Normal appearance.  HENT:     Head: Normocephalic and atraumatic.     Nose: Nose normal.     Mouth/Throat:     Mouth: Mucous membranes are moist.     Pharynx: Oropharynx is clear.  Eyes:     Extraocular   Movements: Extraocular movements intact.     Comments: Pale conjunctiva  Cardiovascular:     Rate and Rhythm: Normal rate and regular rhythm.  Pulmonary:     Effort: Pulmonary effort is normal. No respiratory distress.  Abdominal:     General: Abdomen is flat. Bowel sounds are normal. There is no distension.     Palpations: Abdomen is soft. There is no mass.     Tenderness: There is no abdominal tenderness. There is no guarding or rebound.     Hernia: No hernia is present.  Musculoskeletal:        General: Normal range of motion.     Cervical back: Normal range of motion and neck supple.  Skin:    General: Skin is warm and dry.     Coloration: Skin is pale. Skin is not jaundiced.  Neurological:     General: No focal deficit present.     Mental Status: She is oriented to person, place, and time.  Psychiatric:        Mood and Affect: Mood normal.        Behavior: Behavior normal.        Thought Content: Thought content normal.        Judgment: Judgment normal.      LAB RESULTS: Recent Labs    09/29/22 1710 09/30/22 0320  WBC 6.9  --   HGB 8.8* 8.1*  HCT 29.1* 26.1*  PLT 315  --    BMET Recent Labs     09/29/22 1710  NA 139  K 3.6  CL 102  CO2 28  GLUCOSE 115*  BUN 9  CREATININE 0.68  CALCIUM 9.1   LFT No results for input(s): "PROT", "ALBUMIN", "AST", "ALT", "ALKPHOS", "BILITOT", "BILIDIR", "IBILI" in the last 72 hours. PT/INR No results for input(s): "LABPROT", "INR" in the last 72 hours.  STUDIES: US Venous Img Lower Bilateral (DVT)  Result Date: 09/29/2022 CLINICAL DATA:  Bilateral lower extremity edema. EXAM: BILATERAL LOWER EXTREMITY VENOUS DOPPLER ULTRASOUND TECHNIQUE: Gray-scale sonography with graded compression, as well as color Doppler and duplex ultrasound were performed to evaluate the lower extremity deep venous systems from the level of the common femoral vein and including the common femoral, femoral, profunda femoral, popliteal and calf veins including the posterior tibial, peroneal and gastrocnemius veins when visible. The superficial great saphenous vein was also interrogated. Spectral Doppler was utilized to evaluate flow at rest and with distal augmentation maneuvers in the common femoral, femoral and popliteal veins. COMPARISON:  August 24, 2022 FINDINGS: RIGHT LOWER EXTREMITY Common Femoral Vein: No evidence of thrombus. Normal compressibility, respiratory phasicity and response to augmentation. Saphenofemoral Junction: No evidence of thrombus. Normal compressibility and flow on color Doppler imaging. Profunda Femoral Vein: No evidence of thrombus. Normal compressibility and flow on color Doppler imaging. Femoral Vein: No evidence of thrombus. Normal compressibility, respiratory phasicity and response to augmentation. Popliteal Vein: No evidence of thrombus. Normal compressibility, respiratory phasicity and response to augmentation. Calf Veins: No evidence of thrombus. Normal compressibility and flow on color Doppler imaging. Superficial Great Saphenous Vein: No evidence of thrombus. Normal compressibility. Venous Reflux:  None. Other Findings:  None. LEFT LOWER  EXTREMITY Common Femoral Vein: No evidence of thrombus. Normal compressibility, respiratory phasicity and response to augmentation. Saphenofemoral Junction: No evidence of thrombus. Normal compressibility and flow on color Doppler imaging. Profunda Femoral Vein: No evidence of thrombus. Normal compressibility and flow on color Doppler imaging. Femoral Vein: No evidence of nonocclusive thrombus with abnormal compressibility, respiratory   phasicity and response to augmentation. Popliteal Vein: No evidence of thrombus. Normal compressibility, respiratory phasicity and response to augmentation. Calf Veins: No evidence of thrombus. Normal compressibility and flow on color Doppler imaging. Superficial Great Saphenous Vein: No evidence of thrombus. Normal compressibility. Venous Reflux:  None. Other Findings:  None. IMPRESSION: 1. Nonocclusive DVT within the LEFT femoral vein. 2. No evidence of DVT within the RIGHT lower extremity. Electronically Signed   By: Thaddeus  Houston M.D.   On: 09/29/2022 20:34      Impression    Anemia, heme positive stool -Hgb 8.8 (9.5 1-month ago) -BUN 9, creatinine 0.68 Fecal occult could be positive for multiple reasons including use of Eliquis.  BUN is normal and no overt bleeding at this time.  Unsure how acute this hemoglobin drop is since last known CBC was 1 month ago.  Appears hemoglobin has dropped gradually since CBC 3 months ago showed Hgb of 10.5.  Chronic DVT - Not anticoagulated, Heparin was stopped 4/26 AM   Plan   - EGD tomorrow - I thoroughly discussed the procedure with the patient (at bedside) to include nature of the procedure, alternatives, benefits, and risks (including but not limited to bleeding, infection, perforation, anesthesia/cardiac pulmonary complications).  Patient verbalized understanding and gave verbal consent to proceed with EGD. - Continue daily CBC and transfuse as needed to maintain HGB > 7  - Continue supportive care - Protonix IV 40 Mg  twice daily  Thank you for your kind consultation, we will continue to follow.  Ethan Clayburn M Calisha Tindel  09/30/2022, 10:01 AM  

## 2022-09-30 NOTE — Assessment & Plan Note (Signed)
Check serial H&H. No frank melena.

## 2022-09-30 NOTE — Progress Notes (Signed)
Ultrasound completed-no DVT appreciated in the left femoral vein.  Chronic DVT in the gastrocnemius.  This does not need to be anticoagulated. Can stop heparin No role for anticoagulation, no role for IVC filter. Canceled, patient can eat.  Stefanie [name]

## 2022-09-30 NOTE — H&P (Signed)
History and Physical    Stefanie [name] ZOX:[id number] DOB: 05-28-1941 DOA: 09/29/2022  DOS: the patient was seen and examined on 09/29/2022  PCP: [name], MD   Patient coming from: Home  I have personally briefly reviewed patient's old medical records in [facility]  CC: left leg swelling HPI: 82 year old female history of hypothyroidism, depression, prior DVT of her left leg in January 2024 now off of Eliquis presents to the ER today with left leg swelling for several days now.  Patient was in a walking boot due to a Achilles tendon injury.  She noticed increasing swelling.  She came to the ER for evaluation.  Her lower extremity ultrasound was positive for DVT in the left femoral vein.  She had heme positive stools that were was checked by the EDP but no frank melena.  Vascular surgery consulted.  Patient denies any chest pain shortness of breath.  Hemoglobin is 8.8.  1 month ago was 9.5.  3 months ago was 10.2.   ED Course: LE U/S showed left femoral dvt. HGB 8.8. +hemoccult. No melena  Review of Systems:  Review of Systems  Constitutional: Negative.   HENT: Negative.    Eyes: Negative.   Respiratory: Negative.  Negative for sputum production.   Cardiovascular:  Positive for PND.       Left leg swelling  Gastrointestinal: Negative.  Negative for blood in stool.  Genitourinary: Negative.   Musculoskeletal:        Left leg swelling  Skin: Negative.   Neurological: Negative.   Endo/Heme/Allergies: Negative.   Psychiatric/Behavioral: Negative.    All other systems reviewed and are negative.   Past Medical History:  Diagnosis Date   Anxiety    Arthritis    Colon polyps    Depression    Diverticulosis of colon (without mention of hemorrhage)    Fibromyalgia    GERD (gastroesophageal reflux disease)    Hiatal hernia    History of IBS    History of kidney stones    Hypothyroid    Hypothyroidism     Past Surgical History:  Procedure Laterality Date    ABDOMINAL HYSTERECTOMY     ANKLE SURGERY     bilateral    APPENDECTOMY  1985   BREAST BIOPSY Left    Benign    CHOLECYSTECTOMY  1985   ESOPHAGEAL MANOMETRY N/A 10/22/2012   Procedure: ESOPHAGEAL MANOMETRY (EM);  Surgeon: Mardella Layman, MD;  Location: [facility];  Service: Endoscopy;  Laterality: N/A;   EXTRACORPOREAL SHOCK WAVE LITHOTRIPSY Left 11/26/2018   Procedure: EXTRACORPOREAL SHOCK WAVE LITHOTRIPSY (ESWL);  Surgeon: Marcine Matar, MD;  Location: [facility];  Service: Urology;  Laterality: Left;   HEMORRHOID SURGERY     RECTOCELE REPAIR     TUBAL LIGATION       reports that she has never smoked. She has never used smokeless tobacco. She reports that she does not drink alcohol and does not use drugs.  Allergies  Allergen Reactions   Gabapentin     Other Reaction(s): Dizziness, Dizziness  States made her feel like she was drunk. Unsteady gait and dizziness.   States made her feel like she was drunk. Unsteady gait and dizziness.     States made her feel like she was drunk. Unsteady gait and dizziness.  States made her feel like she was drunk. Unsteady gait and dizziness.   Lidocaine Rash   Metoclopramide Rash   Other Other (See Comments)    DYE+Nitrofurantoin+Brilliant Blue Fcf  Sulfa Antibiotics Rash   Z-Pak [Azithromycin] Rash    REACTION: rash   Metoclopramide Hcl     REACTION: a drawing of all her muscles   Penicillins    Procaine Hcl    Macrodantin [Nitrofurantoin Macrocrystal] Rash    Family History  Problem Relation Age of Onset   Colon polyps Brother    Breast cancer Neg Hx     Prior to Admission medications   Medication Sig Start Date End Date Taking? Authorizing Provider  APIXABAN Everlene Balls) VTE STARTER PACK (10MG  AND 5MG ) Take as directed on package: start with two-5mg  tablets twice daily for 7 days. On day 8, switch to one-5mg  tablet twice daily. Patient not taking: Reported on 08/24/2022 07/01/22   Eber Hong, MD  escitalopram (LEXAPRO) 20  MG tablet Take 10 mg by mouth daily.     [provider]  furosemide (LASIX) 20 MG tablet Take 1 tablet (20 mg total) by mouth daily for 10 days. 07/05/22 08/24/22  Redwine, Madison A, PA-C  levothyroxine (SYNTHROID, LEVOTHROID) 75 MCG tablet Take 75 mcg by mouth daily before breakfast.    [provider]  potassium chloride (KLOR-CON) 10 MEQ tablet Take 10 mEq by mouth daily. 08/17/22   [provider]  Vitamin D, Ergocalciferol, (DRISDOL) 1.25 MG (50000 UT) CAPS capsule Take 50,000 Units by mouth once a week.    [provider]    Physical Exam: Vitals:   09/29/22 2332 09/30/22 0100 09/30/22 0103 09/30/22 0217  BP:  125/62  123/65  Pulse:  75  70  Resp:  (!) 24  18  Temp: 97.8 F (36.6 C)  98.3 F (36.8 C) 98.2 F (36.8 C)  TempSrc: Oral  Oral Oral  SpO2:  98%  99%    Physical Exam Vitals and nursing note reviewed.  Constitutional:      General: She is not in acute distress.    Appearance: She is not ill-appearing, toxic-appearing or diaphoretic.  HENT:     Head: Normocephalic and atraumatic.     Nose: Nose normal.  Cardiovascular:     Rate and Rhythm: Normal rate and regular rhythm.     Pulses: Normal pulses.  Pulmonary:     Effort: Pulmonary effort is normal.     Breath sounds: Normal breath sounds.  Abdominal:     General: Abdomen is flat. Bowel sounds are normal. There is no distension.     Palpations: Abdomen is soft.  Musculoskeletal:     Comments: +left lower leg edema and increased circumference compared to right lower leg  Skin:    General: Skin is warm and dry.     Capillary Refill: Capillary refill takes less than 2 seconds.  Neurological:     General: No focal deficit present.     Mental Status: She is alert and oriented to person, place, and time.      Labs on Admission: I have personally reviewed following labs and imaging studies  CBC: Recent Labs  Lab 09/29/22 1710  WBC 6.9  HGB 8.8*  HCT 29.1*  MCV 88.4   PLT 315   Basic Metabolic Panel: Recent Labs  Lab 09/29/22 1710  NA 139  K 3.6  CL 102  CO2 28  GLUCOSE 115*  BUN 9  CREATININE 0.68  CALCIUM 9.1   GFR: CrCl cannot be calculated (Unknown ideal weight.).  BNP (last 3 results) Recent Labs    07/01/22 1131 09/29/22 1710  BNP 72.0 65.8    Radiological Exams on  Admission: I have personally reviewed images US Venous Img Lower Bilateral (DVT)  Result Date: 09/29/2022 CLINICAL DATA:  Bilateral lower extremity edema. EXAM: BILATERAL LOWER EXTREMITY VENOUS DOPPLER ULTRASOUND TECHNIQUE: Gray-scale sonography with graded compression, as well as color Doppler and duplex ultrasound were performed to evaluate the lower extremity deep venous systems from the level of the common femoral vein and including the common femoral, femoral, profunda femoral, popliteal and calf veins including the posterior tibial, peroneal and gastrocnemius veins when visible. The superficial great saphenous vein was also interrogated. Spectral Doppler was utilized to evaluate flow at rest and with distal augmentation maneuvers in the common femoral, femoral and popliteal veins. COMPARISON:  August 24, 2022 FINDINGS: RIGHT LOWER EXTREMITY Common Femoral Vein: No evidence of thrombus. Normal compressibility, respiratory phasicity and response to augmentation. Saphenofemoral Junction: No evidence of thrombus. Normal compressibility and flow on color Doppler imaging. Profunda Femoral Vein: No evidence of thrombus. Normal compressibility and flow on color Doppler imaging. Femoral Vein: No evidence of thrombus. Normal compressibility, respiratory phasicity and response to augmentation. Popliteal Vein: No evidence of thrombus. Normal compressibility, respiratory phasicity and response to augmentation. Calf Veins: No evidence of thrombus. Normal compressibility and flow on color Doppler imaging. Superficial Great Saphenous Vein: No evidence of thrombus. Normal compressibility.  Venous Reflux:  None. Other Findings:  None. LEFT LOWER EXTREMITY Common Femoral Vein: No evidence of thrombus. Normal compressibility, respiratory phasicity and response to augmentation. Saphenofemoral Junction: No evidence of thrombus. Normal compressibility and flow on color Doppler imaging. Profunda Femoral Vein: No evidence of thrombus. Normal compressibility and flow on color Doppler imaging. Femoral Vein: No evidence of nonocclusive thrombus with abnormal compressibility, respiratory phasicity and response to augmentation. Popliteal Vein: No evidence of thrombus. Normal compressibility, respiratory phasicity and response to augmentation. Calf Veins: No evidence of thrombus. Normal compressibility and flow on color Doppler imaging. Superficial Great Saphenous Vein: No evidence of thrombus. Normal compressibility. Venous Reflux:  None. Other Findings:  None. IMPRESSION: 1. Nonocclusive DVT within the LEFT femoral vein. 2. No evidence of DVT within the RIGHT lower extremity. Electronically Signed   By: Aram Candela M.D.   On: 09/29/2022 20:34    EKG: My personal interpretation of EKG shows: no EKG to review  Assessment/Plan Principal Problem:   Acute deep vein thrombosis (DVT) of femoral vein of left lower extremity (HCC) Active Problems:   Heme positive stool   Hypothyroidism   Depression    Assessment and Plan: * Acute deep vein thrombosis (DVT) of femoral vein of left lower extremity (HCC) Observation med/surg bed. On IV heparin. Had heme+ stools. No frank melena. Check serial H&H. 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 Defer to vascular surgery to decide if they want to repeat her LE U/S given the discrepancy between the narrative description and final impression on LE U/S. Vascular surgery wanted pt to be kept NPO in case she needs IVC filter.  Heme positive stool Check serial H&H. No frank melena.  Depression Continue lexapro 20 mg  qhs.  Hypothyroidism Continue synthroid 75 mcg qhs   DVT prophylaxis: IV heparin gtts Code Status: Full Code Family Communication: no family at bedside  Disposition Plan: return home  Consults called: [facility](vascular)  Admission status: Inpatient, Telemetry bed   Carollee Herter, DO [facility] 09/30/2022, 3:01 AM

## 2022-09-30 NOTE — Progress Notes (Addendum)
PROGRESS NOTE  Stefanie [name]  DOB: 11-21-1940  PCP: [name], MD KGM:[id number]  DOA: 09/29/2022  LOS: 0 days  Hospital Day: 2  Brief narrative: Stefanie [name] is a 82 y.o. female with PMH significant for hypothyroidism, GERD, anxiety/depression/fibromyalgia, arthritis who was recently (January 2024) diagnosed to have a superficial venous thrombosis.  It was less than 3 cm from the saphenofemoral junction and hence treated as a true DVT with Eliquis for 3 months.   Last month, patient twisted her ankle and sustained a Achilles tendon injury.  She was placed in a walking boot.  She has had reduced mobility with that.  She noticed progressively worsening swelling and hence sent to the ED by PCP on 4/25.  In the ED, patient was hemodynamically stable Labs showed hemoglobin down to 8.8 from 9.5 a month ago. Patient denied frank melena but she was also found to have FOBT positive stool. Left lower extremity ultrasound duplex showed left femoral DVT Vascular surgery recommended IV heparin drip for now and hospitalization to consider IVC filter placement Admitted to [facility]  Vascular surgery consult was obtained this morning.  Ultrasound duplex of lower EXTR was repeated.  Per vascular surgeon Dr. Sherral Hammers, patient does not have any new DVT.  Her previous superficial venous thrombosis has resolved as well.  No indication of further anticoagulation. GI has been called for anemia and FOBT positive stool  Subjective: Patient was seen and examined this morning.  Pleasant elderly Caucasian..  Lying on bed.  Not in distress.  Brother at bedside. Chart reviewed Afebrile and hemodynamically stable Repeat labs this morning with hemoglobin down to 8.1  Assessment and plan: Acute DVT ruled out Recently had a DVT and completed treatment with Eliquis.  Initial ultrasound duplex on 4/25 suggested a new DVT and hence admitted on IV heparin.  Vascular surgery consulted.  Repeat ultrasound duplex on 4/26 does  not suggest DVT and hence no need of anticoagulation or filter.  Acute GI bleeding heme positive stool No frank melena. GI called.  May need EGD. Recent Labs    07/01/22 1131 08/24/22 1156 09/29/22 1710 09/30/22 0320 09/30/22 0900  HGB 10.2* 9.5* 8.8* 8.1* 8.1*  MCV 95.1 92.1 88.4  --   --    B/l LE swelling L>R Patient recently had ankle injury leading to impaired mobility.    Depression Continue lexapro 20 mg qhs.   Hypothyroidism Continue synthroid 75 mcg qhs   Mobility: PT eval ordered.  Goals of care   Code Status: Full Code     DVT prophylaxis:  SCDs Start: 09/30/22 0345   Antimicrobials: None Fluid: Currently on LR at 50 mill per hour.  Can stop IV hydration today. Consultants: GI Family Communication: Brother at bedside  Status: Inpatient Level of care:  Telemetry Medical   Needs to continue in-hospital care:  Pending EGD  Patient from: Home Anticipated d/c to: Pending clinical course and EGD    Diet:  Diet Order             Diet NPO time specified  Diet effective midnight           DIET SOFT Room service appropriate? Yes; Fluid consistency: Thin  Diet effective now                   Scheduled Meds:  escitalopram  20 mg Oral QHS   levothyroxine  75 mcg Oral Q0600    PRN meds: acetaminophen **OR** acetaminophen, HYDROcodone-acetaminophen, ondansetron **OR** ondansetron (ZOFRAN) IV  Infusions:     Antimicrobials: Anti-infectives (From admission, onward)    None       Nutritional status:  There is no height or weight on file to calculate BMI.          Objective: Vitals:   09/30/22 0217 09/30/22 0818  BP: 123/65 (!) 101/47  Pulse: 70 76  Resp: 18 17  Temp: 98.2 F (36.8 C) 97.8 F (36.6 C)  SpO2: 99% 96%   No intake or output data in the 24 hours ending 09/30/22 1322 There were no vitals filed for this visit. Weight change:  There is no height or weight on file to calculate BMI.   Physical  Exam: General exam: Pleasant, elderly Caucasian female.  Not in distress Skin: No rashes, lesions or ulcers. HEENT: Atraumatic, normocephalic, no obvious bleeding Lungs: Clear to auscultation bilaterally CVS: Regular rate and rhythm, no murmur GI/Abd: soft, nontender, nondistended, bowel sound present CNS: Alert, awake, oriented x 3 Psychiatry: Mood appropriate Extremities: Left more than right 1+ pedal edema.  No calf tenderness.  Data Review: I have personally reviewed the laboratory data and studies available.  F/u labs  Unresulted Labs (From admission, onward)     Start     Ordered   10/01/22 0500  CBC with Differential/Platelet  Daily,   R      09/30/22 1321   09/30/22 0307  Hemoglobin and hematocrit, blood  Now then every 6 hours,   R      09/30/22 0306            Total time spent in review of labs and imaging, patient evaluation, formulation of plan, documentation and communication with family: 55 minutes  Signed, Lorin Glass, MD [facility] 09/30/2022

## 2022-09-30 NOTE — Subjective & Objective (Signed)
CC: left leg swelling HPI: 82 year old female history of hypothyroidism, depression, prior DVT of her left leg in January 2024 now off of Eliquis presents to the ER today with left leg swelling for several days now.  Patient was in a walking boot due to a Achilles tendon injury.  She noticed increasing swelling.  She came to the ER for evaluation.  Her lower extremity ultrasound was positive for DVT in the left femoral vein.  She had heme positive stools that were was checked by the EDP but no frank melena.  Vascular surgery consulted.  Patient denies any chest pain shortness of breath.  Hemoglobin is 8.8.  1 month ago was 9.5.  3 months ago was 10.2.

## 2022-09-30 NOTE — Progress Notes (Signed)
Will schedule IVC filter placement today. Keep NPO. Will see shortly.

## 2022-09-30 NOTE — Consult Note (Signed)
Hospital Consult    Reason for Consult:  IVF filter placement  Requesting Physician: ED MRN #:  [id number]  History of Present Illness: This is a 82 y.o. female with history of superficial venous thrombosis that was treated leg DVT due to being less than 3 cm from the saphenofemoral junction.  At the last 3 months, she has had a decrease in her hematocrit from 10 to 9-8.  I was called overnight for possible IVC filter placement.  I discussed this with the emergency medicine provider, and asked that she be placed on heparin, as IVC filter placement indication is failure of anticoagulation in the setting of bleeding.  Although she had a positive Hemoccult test, she had no frank bleeding from her bottom, no bloody bowel movements.  This morning, Manar was sitting comfortably.  She continues to deny hematochezia.  No abdominal pain  Past Medical History:  Diagnosis Date   Anxiety    Arthritis    Colon polyps    Depression    Diverticulosis of colon (without mention of hemorrhage)    Fibromyalgia    GERD (gastroesophageal reflux disease)    Hiatal hernia    History of IBS    History of kidney stones    Hypothyroid    Hypothyroidism     Past Surgical History:  Procedure Laterality Date   ABDOMINAL HYSTERECTOMY     ANKLE SURGERY     bilateral    APPENDECTOMY  1985   BREAST BIOPSY Left    Benign    CHOLECYSTECTOMY  1985   ESOPHAGEAL MANOMETRY N/A 10/22/2012   Procedure: ESOPHAGEAL MANOMETRY (EM);  Surgeon: Mardella Layman, MD;  Location: [facility];  Service: Endoscopy;  Laterality: N/A;   EXTRACORPOREAL SHOCK WAVE LITHOTRIPSY Left 11/26/2018   Procedure: EXTRACORPOREAL SHOCK WAVE LITHOTRIPSY (ESWL);  Surgeon: Marcine Matar, MD;  Location: [facility];  Service: Urology;  Laterality: Left;   HEMORRHOID SURGERY     RECTOCELE REPAIR     TUBAL LIGATION      Allergies  Allergen Reactions   Gabapentin     Other Reaction(s): Dizziness, Dizziness  States made her feel like  she was drunk. Unsteady gait and dizziness.   States made her feel like she was drunk. Unsteady gait and dizziness.     States made her feel like she was drunk. Unsteady gait and dizziness.  States made her feel like she was drunk. Unsteady gait and dizziness.   Lidocaine Rash   Metoclopramide Rash   Other Other (See Comments)    DYE+Nitrofurantoin+Brilliant Blue Fcf   Sulfa Antibiotics Rash   Z-Pak [Azithromycin] Rash    REACTION: rash   Metoclopramide Hcl     REACTION: a drawing of all her muscles   Penicillins    Procaine Hcl    Macrodantin [Nitrofurantoin Macrocrystal] Rash    Prior to Admission medications   Medication Sig Start Date End Date Taking? Authorizing Provider  APIXABAN (ELIQUIS) VTE STARTER PACK (10MG  AND 5MG ) Take as directed on package: start with two-5mg  tablets twice daily for 7 days. On day 8, switch to one-5mg  tablet twice daily. Patient not taking: Reported on 08/24/2022 07/01/22   Eber Hong, MD  escitalopram (LEXAPRO) 20 MG tablet Take 10 mg by mouth daily.     [provider]  furosemide (LASIX) 20 MG tablet Take 1 tablet (20 mg total) by mouth daily for 10 days. 07/05/22 08/24/22  Redwine, Madison A, PA-C  levothyroxine (SYNTHROID, LEVOTHROID) 75 MCG tablet Take 75 mcg by  mouth daily before breakfast.    [provider]  potassium chloride (KLOR-CON) 10 MEQ tablet Take 10 mEq by mouth daily. 08/17/22   [provider]  Vitamin D, Ergocalciferol, (DRISDOL) 1.25 MG (50000 UT) CAPS capsule Take 50,000 Units by mouth once a week.    [provider]    Social History   Socioeconomic History   Marital status: Widowed    Spouse name: Not on file   Number of children: 1   Years of education: Not on file   Highest education level: Not on file  Occupational History   Occupation: retired    Associate Professor: RETIRED  Tobacco Use   Smoking status: Never   Smokeless tobacco: Never  Vaping Use   Vaping Use: Never used  Substance  and Sexual Activity   Alcohol use: No    Alcohol/week: 0.0 standard drinks of alcohol   Drug use: No   Sexual activity: Not on file  Other Topics Concern   Not on file  Social History Narrative   Not on file   Social Determinants of Health   Financial Resource Strain: Not on file  Food Insecurity: Not on file  Transportation Needs: Not on file  Physical Activity: Not on file  Stress: Not on file  Social Connections: Not on file  Intimate Partner Violence: Not on file   Family History  Problem Relation Age of Onset   Colon polyps Brother    Breast cancer Neg Hx     ROS: Otherwise negative unless mentioned in HPI  Physical Examination  Vitals:   09/30/22 0103 09/30/22 0217  BP:  123/65  Pulse:  70  Resp:  18  Temp: 98.3 F (36.8 C) 98.2 F (36.8 C)  SpO2:  99%   There is no height or weight on file to calculate BMI.  General:  WDWN in NAD Gait: Not observed HENT: WNL, normocephalic Pulmonary: normal non-labored breathing, without Rales, rhonchi,  wheezing Cardiac: regular Abdomen: soft, NT/ND, no masses Skin: without rashes Vascular Exam/Pulses: 2 pedal pulses Extremities: without ischemic changes, without Gangrene , without cellulitis; without open wounds;  Musculoskeletal: no muscle wasting or atrophy  Neurologic: A&O X 3;  No focal weakness or paresthesias are detected; speech is fluent/normal Psychiatric:  The pt has Normal affect. Lymph:  Unremarkable  CBC    Component Value Date/Time   WBC 6.9 09/29/2022 1710   RBC 3.29 (L) 09/29/2022 1710   HGB 8.1 (L) 09/30/2022 0320   HCT 26.1 (L) 09/30/2022 0320   PLT 315 09/29/2022 1710   MCV 88.4 09/29/2022 1710   MCH 26.7 09/29/2022 1710   MCHC 30.2 09/29/2022 1710   RDW 15.5 09/29/2022 1710   LYMPHSABS 1.8 07/01/2022 1131   MONOABS 0.6 07/01/2022 1131   EOSABS 0.3 07/01/2022 1131   BASOSABS 0.1 07/01/2022 1131    BMET    Component Value Date/Time   NA 139 09/29/2022 1710   K 3.6 09/29/2022 1710    CL 102 09/29/2022 1710   CO2 28 09/29/2022 1710   GLUCOSE 115 (H) 09/29/2022 1710   BUN 9 09/29/2022 1710   CREATININE 0.68 09/29/2022 1710   CALCIUM 9.1 09/29/2022 1710   GFRNONAA >60 09/29/2022 1710    COAGS: No results found for: "INR", "PROTIME"    ASSESSMENT/PLAN: This is a 82 y.o. female with question of new left-sided femoral vein DVT.  She has bilateral lower extremity swelling, which is baseline.  States the pain occurred after walking.   Recent ultrasound  demonstrates discordant data with the read noting 'Femoral Vein: No evidence of nonocclusive thrombus with abnormal compressibility, respiratory phasicity and response to augmentation, however the conclusion notes: Nonocclusive DVT within the LEFT femoral vein.     Called the ultrasound lab, and she will be rescanned this morning.  Should there be a DVT, and her hematocrit continues to drop, she is scheduled for this afternoon, IVC filter placement.  I discussed the above with Meriam Sprague, she was okay moving forward with this plan.   Fara Olden MD [facility] [phone] 09/30/2022  8:19 AM

## 2022-09-30 NOTE — Assessment & Plan Note (Signed)
Observation med/surg bed. On IV heparin. Had heme+ stools. No frank melena. Check serial H&H. 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 Defer to vascular surgery to decide if they want to repeat her LE U/S given the discrepancy between the narrative description and final impression on LE U/S. Vascular surgery wanted pt to be kept NPO in case she needs IVC filter.

## 2022-09-30 NOTE — H&P (View-Only) (Signed)
Consultation  Referring Provider:  [facility]  Primary Care Physician:  [name], MD Primary Gastroenterologist:  [facility]        Reason for Consultation:     anemia, heme positive stool  LOS: 0 days          HPI:   Stefanie [name] is a 82 y.o. female with past medical history significant for hypothyroidism, GERD, anxiety/depression, fibromyalgia, arthritis, superficial thrombosis (January 2024), treated history of DVT with Eliquis (no longer on Eliquis), presents for evaluation of anemia and heme positive stool.  Patient initially presented to ED with left leg swelling for several days.  Lower extremity ultrasound was positive for DVT in left femoral vein. Patient underwent repeat ultrasound, no DVT appreciated in left femoral vein.  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  Vascular surgery recommended no need for IVC filter and no need for anticoagulation. Heparin was stopped.  Patient's Hgb dropped from 9.5 (1 month ago) to 8.8 yesterday.  Appears hemoglobin is 10.2,  3 months ago.  No overt bleeding.  Patient denies melena/hematochezia.  Fecal occult was positive.  Patient states last colonoscopy was more than 10 years ago, reports it being normal.  Denies family history of colon cancer.  Denies nausea/vomiting.  Reports chronic abdominal discomfort, unchanged.  Denies weight loss. Denies NSAID use, tobacco, alcohol use.  Reports increased depression lately due to loss of her only child (daughter) July 2023 at age 51.  Patient would like to be out of hospital by her birthday on Sunday.  Past Medical History:  Diagnosis Date   Anxiety    Arthritis    Colon polyps    Depression    Diverticulosis of colon (without mention of hemorrhage)    Fibromyalgia    GERD (gastroesophageal reflux disease)    Hiatal hernia    History of IBS    History of kidney stones    Hypothyroid    Hypothyroidism     Surgical History:  She  has a past surgical history that includes Hemorrhoid surgery;  Tubal ligation; Abdominal hysterectomy; Appendectomy (1985); Ankle surgery; Rectocele repair; Esophageal manometry (N/A, 10/22/2012); Cholecystectomy (1985); Breast biopsy (Left); and Extracorporeal shock wave lithotripsy (Left, 11/26/2018). Family History:  Her family history includes Colon polyps in her brother. Social History:   reports that she has never smoked. She has never used smokeless tobacco. She reports that she does not drink alcohol and does not use drugs.  Prior to Admission medications   Medication Sig Start Date End Date Taking? Authorizing Provider  acetaminophen (TYLENOL) 500 MG tablet Take 1,000 mg by mouth as needed for moderate pain.   Yes [provider]  escitalopram (LEXAPRO) 20 MG tablet Take 20 mg by mouth daily.   Yes [provider]  levothyroxine (SYNTHROID, LEVOTHROID) 75 MCG tablet Take 75 mcg by mouth at bedtime.   Yes [provider]  potassium chloride (KLOR-CON) 10 MEQ tablet Take 10 mEq by mouth daily. 08/17/22  Yes [provider]  apixaban (ELIQUIS) 5 MG TABS tablet Take 5 mg by mouth 2 (two) times daily. Patient not taking: Reported on 09/30/2022    [provider]  APIXABAN Everlene Balls) VTE STARTER PACK (10MG  AND 5MG ) Take as directed on package: start with two-5mg  tablets twice daily for 7 days. On day 8, switch to one-5mg  tablet twice daily. Patient not taking: Reported on 08/24/2022 07/01/22   Eber Hong, MD  furosemide (LASIX) 20 MG tablet Take 1 tablet (20 mg total) by  mouth daily for 10 days. Patient not taking: Reported on 09/30/2022 07/05/22 08/24/22  Redwine, Madison A, PA-C  Vitamin D, Ergocalciferol, (DRISDOL) 1.25 MG (50000 UT) CAPS capsule Take 50,000 Units by mouth once a week. Patient not taking: Reported on 09/30/2022    [provider]    Current Facility-Administered Medications  Medication Dose Route Frequency Provider Last Rate Last Admin   acetaminophen (TYLENOL) tablet 650 mg  650 mg  Oral Q6H PRN Carollee Herter, DO       Or   acetaminophen (TYLENOL) suppository 650 mg  650 mg Rectal Q6H PRN Carollee Herter, DO       escitalopram (LEXAPRO) tablet 20 mg  20 mg Oral QHS Carollee Herter, DO   20 mg at 09/30/22 0348   heparin ADULT infusion 100 units/mL (25000 units/259mL)  1,100 Units/hr Intravenous Continuous Carollee Herter, DO 11 mL/hr at 09/30/22 0102 1,100 Units/hr at 09/30/22 0102   HYDROcodone-acetaminophen (NORCO/VICODIN) 5-325 MG per tablet 1 tablet  1 tablet Oral Q6H PRN Carollee Herter, DO       lactated ringers infusion   Intravenous Continuous Carollee Herter, DO 50 mL/hr at 09/30/22 0556 New Bag at 09/30/22 0556   levothyroxine (SYNTHROID) tablet 75 mcg  75 mcg Oral Q0600 Carollee Herter, DO   75 mcg at 09/30/22 0553   ondansetron (ZOFRAN) tablet 4 mg  4 mg Oral Q6H PRN Carollee Herter, DO       Or   ondansetron [facility]) injection 4 mg  4 mg Intravenous Q6H PRN Carollee Herter, DO        Allergies as of 09/29/2022 - Review Complete 09/29/2022  Allergen Reaction Noted   Gabapentin  04/30/2021   Lidocaine Rash 08/26/2015   Metoclopramide Rash 08/26/2015   Other Other (See Comments) 11/26/2018   Sulfa antibiotics Rash 08/26/2015   Z-pak [azithromycin] Rash 04/08/2009   Metoclopramide hcl  04/08/2009   Penicillins  04/08/2009   Procaine hcl  04/08/2009   Macrodantin [nitrofurantoin macrocrystal] Rash 11/21/2018    Review of Systems  Constitutional:  Negative for chills, fever and weight loss.  HENT:  Negative for hearing loss and tinnitus.   Eyes:  Negative for blurred vision and double vision.  Respiratory:  Negative for cough and hemoptysis.   Cardiovascular:  Negative for chest pain and palpitations.  Gastrointestinal:  Negative for abdominal pain, blood in stool, constipation, diarrhea, heartburn, melena, nausea and vomiting.  Genitourinary:  Negative for dysuria and urgency.  Musculoskeletal:  Negative for myalgias and neck pain.  Skin:  Negative for itching and rash.  Neurological:   Negative for seizures and loss of consciousness.  Psychiatric/Behavioral:  Negative for depression and suicidal ideas.        Physical Exam:  Vital signs in last 24 hours: Temp:  [97.8 F (36.6 C)-98.9 F (37.2 C)] 97.8 F (36.6 C) (04/26 0818) Pulse Rate:  [70-83] 76 (04/26 0818) Resp:  [16-24] 17 (04/26 0818) BP: (101-138)/(47-73) 101/47 (04/26 0818) SpO2:  [92 %-100 %] 96 % (04/26 0818) Last BM Date : 09/29/22 Last BM recorded by nurses in past 5 days No data recorded  Physical Exam Constitutional:      General: She is not in acute distress.    Appearance: Normal appearance.  HENT:     Head: Normocephalic and atraumatic.     Nose: Nose normal.     Mouth/Throat:     Mouth: Mucous membranes are moist.     Pharynx: Oropharynx is clear.  Eyes:     Extraocular  Movements: Extraocular movements intact.     Comments: Pale conjunctiva  Cardiovascular:     Rate and Rhythm: Normal rate and regular rhythm.  Pulmonary:     Effort: Pulmonary effort is normal. No respiratory distress.  Abdominal:     General: Abdomen is flat. Bowel sounds are normal. There is no distension.     Palpations: Abdomen is soft. There is no mass.     Tenderness: There is no abdominal tenderness. There is no guarding or rebound.     Hernia: No hernia is present.  Musculoskeletal:        General: Normal range of motion.     Cervical back: Normal range of motion and neck supple.  Skin:    General: Skin is warm and dry.     Coloration: Skin is pale. Skin is not jaundiced.  Neurological:     General: No focal deficit present.     Mental Status: She is oriented to person, place, and time.  Psychiatric:        Mood and Affect: Mood normal.        Behavior: Behavior normal.        Thought Content: Thought content normal.        Judgment: Judgment normal.      LAB RESULTS: Recent Labs    09/29/22 1710 09/30/22 0320  WBC 6.9  --   HGB 8.8* 8.1*  HCT 29.1* 26.1*  PLT 315  --    BMET Recent Labs     09/29/22 1710  NA 139  K 3.6  CL 102  CO2 28  GLUCOSE 115*  BUN 9  CREATININE 0.68  CALCIUM 9.1   LFT No results for input(s): "PROT", "ALBUMIN", "AST", "ALT", "ALKPHOS", "BILITOT", "BILIDIR", "IBILI" in the last 72 hours. PT/INR No results for input(s): "LABPROT", "INR" in the last 72 hours.  STUDIES: US Venous Img Lower Bilateral (DVT)  Result Date: 09/29/2022 CLINICAL DATA:  Bilateral lower extremity edema. EXAM: BILATERAL LOWER EXTREMITY VENOUS DOPPLER ULTRASOUND TECHNIQUE: Gray-scale sonography with graded compression, as well as color Doppler and duplex ultrasound were performed to evaluate the lower extremity deep venous systems from the level of the common femoral vein and including the common femoral, femoral, profunda femoral, popliteal and calf veins including the posterior tibial, peroneal and gastrocnemius veins when visible. The superficial great saphenous vein was also interrogated. Spectral Doppler was utilized to evaluate flow at rest and with distal augmentation maneuvers in the common femoral, femoral and popliteal veins. COMPARISON:  August 24, 2022 FINDINGS: RIGHT LOWER EXTREMITY Common Femoral Vein: No evidence of thrombus. Normal compressibility, respiratory phasicity and response to augmentation. Saphenofemoral Junction: No evidence of thrombus. Normal compressibility and flow on color Doppler imaging. Profunda Femoral Vein: No evidence of thrombus. Normal compressibility and flow on color Doppler imaging. Femoral Vein: No evidence of thrombus. Normal compressibility, respiratory phasicity and response to augmentation. Popliteal Vein: No evidence of thrombus. Normal compressibility, respiratory phasicity and response to augmentation. Calf Veins: No evidence of thrombus. Normal compressibility and flow on color Doppler imaging. Superficial Great Saphenous Vein: No evidence of thrombus. Normal compressibility. Venous Reflux:  None. Other Findings:  None. LEFT LOWER  EXTREMITY Common Femoral Vein: No evidence of thrombus. Normal compressibility, respiratory phasicity and response to augmentation. Saphenofemoral Junction: No evidence of thrombus. Normal compressibility and flow on color Doppler imaging. Profunda Femoral Vein: No evidence of thrombus. Normal compressibility and flow on color Doppler imaging. Femoral Vein: No evidence of nonocclusive thrombus with abnormal compressibility, respiratory  phasicity and response to augmentation. Popliteal Vein: No evidence of thrombus. Normal compressibility, respiratory phasicity and response to augmentation. Calf Veins: No evidence of thrombus. Normal compressibility and flow on color Doppler imaging. Superficial Great Saphenous Vein: No evidence of thrombus. Normal compressibility. Venous Reflux:  None. Other Findings:  None. IMPRESSION: 1. Nonocclusive DVT within the LEFT femoral vein. 2. No evidence of DVT within the RIGHT lower extremity. Electronically Signed   By: Aram Candela M.D.   On: 09/29/2022 20:34      Impression    Anemia, heme positive stool -Hgb 8.8 (9.5 61-month ago) -BUN 9, creatinine 0.68 Fecal occult could be positive for multiple reasons including use of Eliquis.  BUN is normal and no overt bleeding at this time.  Unsure how acute this hemoglobin drop is since last known CBC was 1 month ago.  Appears hemoglobin has dropped gradually since CBC 3 months ago showed Hgb of 10.5.  Chronic DVT - Not anticoagulated, Heparin was stopped 4/26 AM   Plan   - EGD tomorrow - I thoroughly discussed the procedure with the patient (at bedside) to include nature of the procedure, alternatives, benefits, and risks (including but not limited to bleeding, infection, perforation, anesthesia/cardiac pulmonary complications).  Patient verbalized understanding and gave verbal consent to proceed with EGD. - Continue daily CBC and transfuse as needed to maintain HGB > 7  - Continue supportive care - Protonix IV 40 Mg  twice daily  Thank you for your kind consultation, we will continue to follow.  Amandy Chubbuck Leanna Sato  09/30/2022, 10:01 AM

## 2022-09-30 NOTE — Anesthesia Preprocedure Evaluation (Signed)
Anesthesia Evaluation  Patient identified by MRN, date of birth, ID band Patient awake    Reviewed: Allergy & Precautions, NPO status , Patient's Chart, lab work & pertinent test results  Airway Mallampati: II  TM Distance: >3 FB Neck ROM: Full    Dental no notable dental hx.    Pulmonary neg pulmonary ROS   Pulmonary exam normal        Cardiovascular hypertension,  Rhythm:Regular Rate:Normal     Neuro/Psych   Anxiety Depression       GI/Hepatic Neg liver ROS, hiatal hernia,GERD  ,,  Endo/Other  Hypothyroidism    Renal/GU negative Renal ROS  negative genitourinary   Musculoskeletal  (+) Arthritis ,  Fibromyalgia -  Abdominal Normal abdominal exam  (+)   Peds  Hematology Lab Results      Component                Value               Date                      WBC                      6.9                 09/29/2022                HGB                      8.1 (L)             09/30/2022                HCT                      26.5 (L)            09/30/2022                MCV                      88.4                09/29/2022                PLT                      315                 09/29/2022             Lab Results      Component                Value               Date                      NA                       139                 09/30/2022                K                        3.8  09/30/2022                CO2                      27                  09/30/2022                GLUCOSE                  98                  09/30/2022                BUN                      6 (L)               09/30/2022                CREATININE               0.73                09/30/2022                CALCIUM                  8.2 (L)             09/30/2022                GFRNONAA                 >60                 09/30/2022              Anesthesia Other Findings   Reproductive/Obstetrics                              Anesthesia Physical Anesthesia Plan  ASA: 3  Anesthesia Plan: MAC   Post-op Pain Management:    Induction: Intravenous  PONV Risk Score and Plan: 2 and Propofol infusion and Treatment may vary due to age or medical condition  Airway Management Planned: Natural Airway and Nasal Cannula  Additional Equipment: None  Intra-op Plan:   Post-operative Plan:   Informed Consent: I have reviewed the patients History and Physical, chart, labs and discussed the procedure including the risks, benefits and alternatives for the proposed anesthesia with the patient or authorized representative who has indicated his/her understanding and acceptance.     Dental advisory given  Plan Discussed with: CRNA  Anesthesia Plan Comments:        Anesthesia Quick Evaluation

## 2022-09-30 NOTE — Assessment & Plan Note (Signed)
Continue lexapro 20 mg qhs.

## 2022-09-30 NOTE — ED Notes (Signed)
[facility] at bedside 

## 2022-10-01 ENCOUNTER — Inpatient Hospital Stay (HOSPITAL_COMMUNITY): Payer: Medicare Other | Admitting: Anesthesiology

## 2022-10-01 ENCOUNTER — Encounter (HOSPITAL_COMMUNITY): Payer: Self-pay | Admitting: Family Medicine

## 2022-10-01 ENCOUNTER — Encounter (HOSPITAL_COMMUNITY)
Admission: EM | Disposition: A | Discharge status: Home Health | Payer: Self-pay | Source: Home / Self Care | Attending: Internal Medicine

## 2022-10-01 DIAGNOSIS — K2971 Gastritis, unspecified, with bleeding: Secondary | ICD-10-CM

## 2022-10-01 DIAGNOSIS — I82562 Chronic embolism and thrombosis of left calf muscular vein: Secondary | ICD-10-CM | POA: Diagnosis not present

## 2022-10-01 DIAGNOSIS — I10 Essential (primary) hypertension: Secondary | ICD-10-CM

## 2022-10-01 DIAGNOSIS — F418 Other specified anxiety disorders: Secondary | ICD-10-CM

## 2022-10-01 DIAGNOSIS — R195 Other fecal abnormalities: Secondary | ICD-10-CM | POA: Diagnosis not present

## 2022-10-01 DIAGNOSIS — E039 Hypothyroidism, unspecified: Secondary | ICD-10-CM

## 2022-10-01 DIAGNOSIS — K449 Diaphragmatic hernia without obstruction or gangrene: Secondary | ICD-10-CM

## 2022-10-01 HISTORY — PX: ESOPHAGOGASTRODUODENOSCOPY: SHX5428

## 2022-10-01 LAB — CBC WITH DIFFERENTIAL/PLATELET
Abs Immature Granulocytes: 0.01 10*3/uL (ref 0.00–0.07)
Basophils Absolute: 0.1 10*3/uL (ref 0.0–0.1)
Basophils Relative: 1 %
Eosinophils Absolute: 0.4 10*3/uL (ref 0.0–0.5)
Eosinophils Relative: 5 %
HCT: 27 % — ABNORMAL LOW (ref 36.0–46.0)
Hemoglobin: 8.2 g/dL — ABNORMAL LOW (ref 12.0–15.0)
Immature Granulocytes: 0 %
Lymphocytes Relative: 32 %
Lymphs Abs: 2.6 10*3/uL (ref 0.7–4.0)
MCH: 27.2 pg (ref 26.0–34.0)
MCHC: 30.4 g/dL (ref 30.0–36.0)
MCV: 89.7 fL (ref 80.0–100.0)
Monocytes Absolute: 1 10*3/uL (ref 0.1–1.0)
Monocytes Relative: 12 %
Neutro Abs: 4.2 10*3/uL (ref 1.7–7.7)
Neutrophils Relative %: 50 %
Platelets: 283 10*3/uL (ref 150–400)
RBC: 3.01 MIL/uL — ABNORMAL LOW (ref 3.87–5.11)
RDW: 15.5 % (ref 11.5–15.5)
WBC: 8.3 10*3/uL (ref 4.0–10.5)
nRBC: 0 % (ref 0.0–0.2)

## 2022-10-01 SURGERY — EGD (ESOPHAGOGASTRODUODENOSCOPY)
Anesthesia: Monitor Anesthesia Care

## 2022-10-01 MED ORDER — PANTOPRAZOLE SODIUM 40 MG PO TBEC: 40.0000 mg | DELAYED_RELEASE_TABLET | Freq: Two times a day (BID) | ORAL | 3 refills | Status: DC

## 2022-10-01 MED ORDER — PROPOFOL 500 MG/50ML IV EMUL
INTRAVENOUS | Status: DC | PRN
  Administered 2022-10-01: 100 ug/kg/min via INTRAVENOUS

## 2022-10-01 MED ORDER — ACETAMINOPHEN 500 MG PO TABS: 1000.0000 mg | ORAL_TABLET | Freq: Four times a day (QID) | ORAL | 0 refills | Status: DC | PRN

## 2022-10-01 MED ORDER — LIDOCAINE 2% (20 MG/ML) 5 ML SYRINGE
INTRAMUSCULAR | Status: DC | PRN
  Administered 2022-10-01: 60 mg via INTRAVENOUS

## 2022-10-01 MED ORDER — PROPOFOL 10 MG/ML IV BOLUS
INTRAVENOUS | Status: DC | PRN
  Administered 2022-10-01: 20 mg via INTRAVENOUS

## 2022-10-01 NOTE — Anesthesia Procedure Notes (Signed)
Procedure Name: MAC Date/Time: 10/01/2022 7:45 AM  Performed by: Colon Flattery, CRNAPre-anesthesia Checklist: Patient identified, Emergency Drugs available, Suction available and Patient being monitored Patient Re-evaluated:Patient Re-evaluated prior to induction Oxygen Delivery Method: Nasal cannula Preoxygenation: Pre-oxygenation with 100% oxygen Induction Type: IV induction Placement Confirmation: positive ETCO2 Dental Injury: Teeth and Oropharynx as per pre-operative assessment

## 2022-10-01 NOTE — Transfer of Care (Signed)
Immediate Anesthesia Transfer of Care Note  Patient: Stefanie [name]  Procedure(s) Performed: ESOPHAGOGASTRODUODENOSCOPY (EGD)  Patient Location: PACU  Anesthesia Type:MAC  Level of Consciousness: awake, alert , and oriented  Airway & Oxygen Therapy: Patient Spontanous Breathing and Patient connected to nasal cannula oxygen  Post-op Assessment: Report given to RN, Post -op Vital signs reviewed and stable, and Patient moving all extremities  Post vital signs: Reviewed and stable  Last Vitals:  Vitals Value Taken Time  BP 115/55 10/01/22 0800  Temp 36.8 C 10/01/22 0800  Pulse 74 10/01/22 0810  Resp 22 10/01/22 0810  SpO2 95 % 10/01/22 0810  Vitals shown include unvalidated device data.  Last Pain:  Vitals:   10/01/22 0800  TempSrc:   PainSc: 0-No pain         Complications: No notable events documented.

## 2022-10-01 NOTE — Op Note (Addendum)
[facility] Patient Name: Stefanie [name] Procedure Date : 10/01/2022 MRN: [id number] Attending MD: [name] MD, MD, [id number] Date of Birth: 09/24/1940 CSN: [id number] Age: 82 Admit Type: Inpatient Procedure:                Upper GI endoscopy Indications:              Heme positive stool Providers:                [name] MD, MD, Zoe Lan, RN,                            Kandice Robinsons, Technician Referring MD:              Medicines:                Monitored Anesthesia Care Complications:            No immediate complications. Estimated Blood Loss:     Estimated blood loss: none. Procedure:                Pre-Anesthesia Assessment:                           - Prior to the procedure, a History and Physical                            was performed, and patient medications and                            allergies were reviewed. The patient's tolerance of                            previous anesthesia was also reviewed. The risks                            and benefits of the procedure and the sedation                            options and risks were discussed with the patient.                            All questions were answered, and informed consent                            was obtained. Prior Anticoagulants: The patient has                            taken no anticoagulant or antiplatelet agents. ASA                            Grade Assessment: III - A patient with severe                            systemic disease. After reviewing the risks and  benefits, the patient was deemed in satisfactory                            condition to undergo the procedure.                           After obtaining informed consent, the endoscope was                            passed under direct vision. Throughout the                            procedure, the patient's blood pressure, pulse, and                            oxygen  saturations were monitored continuously. The                            [id number]-[id number] ([id number]) Olympus endoscope was introduced                            through the mouth, with the intention of advancing                            to the duodenum. The scope was advanced to the                            duodenal bulb before the procedure was aborted due                            to a large amount of food in the stomach and                            increased risk for aspiration. Medications were                            given. The upper GI endoscopy was performed with                            difficulty due to presence of food. The patient                            tolerated the procedure well. Scope In: Scope Out: Findings:      The esophagus was normal.      A medium-sized hiatal hernia was present. Food was present in the hiatal       hernia limiting any meaningful evaluation of the mucosa.      A large amount of food (residue) was found in the entire examined       stomach.      Localized moderate inflammation characterized by erythema, friability       and granularity was found in the gastric antrum in both a scattered and       linear pattern. Food present throughout the stomach limited the  evaluation for other etiologies. No blood present.      Food (residue) was found in the duodenal bulb. No blood present. Impression:               - Normal esophagus.                           - A large amount of food (residue) in the stomach                            and duodenum. The procedure was aborted due to the                            high risk for aspiration.                           - Medium sized hiatal hernia.                           - Gastritis. This is contributing to her anemia and                            hemoccult positive stools. Unfortunately, given the                            present food, I cannot exclude other etiologies.                           -  Retained food in the duodenum.                           - No specimens collected. Recommendation:           - Return patient to hospital ward for ongoing care.                           - Clear liquid diet in preparation for repeating                            the procedure tomorrow.                           - Continue present medications.                           - Use Protonix (pantoprazole) 40 mg PO BID.                           - Avoid all NSAIDs.                           - Continue to follow-up serial hgb/hct with                            transfusion as indicated.                           -  In reviewing the results with the patient, she                            does not want to stay for any further endoscopic                            evaluation. Tomorrow is her birthday. I can arrange                            for outpatient follow-up at [facility] in the near                            future, but, there is a risk that she may require                            readmission prior to the follow-up being performed                            due to schedule challenges.                           - She was hoping to avoid a colonoscopy all                            together.                           - The findings and recommendations were discussed                            with the patient. She was given a copy of the                            procedure note. Procedure Code(s):        --- Professional ---                           [phone], 53, Esophagogastroduodenoscopy, flexible,                            transoral; diagnostic, including collection of                            specimen(s) by brushing or washing, when performed                            (separate procedure) Diagnosis Code(s):        --- Professional ---                           K29.71, Gastritis, unspecified, with bleeding                           R19.5, Other fecal abnormalities CPT copyright 2022  American Medical Association. All rights reserved. The codes  documented in this report are preliminary and upon coder review may  be revised to meet current compliance requirements. [name] MD, MD 10/01/2022 8:18:42 AM This report has been signed electronically. Number of Addenda: 0

## 2022-10-01 NOTE — Interval H&P Note (Signed)
History and Physical Interval Note:  10/01/2022 7:35 AM  Stefanie [name]  has presented today for surgery, with the diagnosis of anemia, heme positive stool.  The various methods of treatment have been discussed with the patient and family. After consideration of risks, benefits and other options for treatment, the patient has consented to  Procedure(s): ESOPHAGOGASTRODUODENOSCOPY (EGD) (N/A) as a surgical intervention.  The patient's history has been reviewed, patient examined, no change in status, stable for surgery.  I have reviewed the patient's chart and labs.  Questions were answered to the patient's satisfaction.     [name]

## 2022-10-01 NOTE — Anesthesia Postprocedure Evaluation (Signed)
Anesthesia Post Note  Patient: Stefanie [name]  Procedure(s) Performed: ESOPHAGOGASTRODUODENOSCOPY (EGD)     Patient location during evaluation: PACU Anesthesia Type: MAC Level of consciousness: awake and alert Pain management: pain level controlled Vital Signs Assessment: post-procedure vital signs reviewed and stable Respiratory status: spontaneous breathing, nonlabored ventilation, respiratory function stable and patient connected to nasal cannula oxygen Cardiovascular status: stable and blood pressure returned to baseline Postop Assessment: no apparent nausea or vomiting Anesthetic complications: no   No notable events documented.  Last Vitals:  Vitals:   10/01/22 0815 10/01/22 0833  BP: (!) 109/41 (!) 111/41  Pulse: 71 72  Resp: 12 17  Temp: 36.7 C 36.4 C  SpO2: 95% 96%    Last Pain:  Vitals:   10/01/22 1000  TempSrc:   PainSc: 0-No pain                 Earl Lites P Siddhant Hashemi

## 2022-10-01 NOTE — Discharge Summary (Signed)
Physician Discharge Summary   Jeronica Stlouis ZOX:[id number] DOB: 04-11-1941 DOA: 09/29/2022  PCP: [name], MD  Admit date: 09/29/2022 Discharge date: 10/01/2022   Admitted From: Home Disposition:  Home Discharging physician: Lewie Chamber, MD Barriers to discharge: none  Recommendations at discharge: Follow up with GI  Home Health: PT, OT Equipment/Devices: none  Discharge Condition: stable CODE STATUS: Full Diet recommendation:  Diet Orders (From admission, onward)     Start     Ordered   10/01/22 0937  Diet regular Fluid consistency: Thin  Diet effective now       Question:  Fluid consistency:  Answer:  Thin   10/01/22 0936   10/01/22 0000  Diet general        10/01/22 0945            Hospital Course:  * Heme positive stool - No frank melena.  Actually had presented for left lower extremity swelling and during workup found to have possible DVT and then stools were checked which were heme positive -GI was consulted and EGD attempted on 10/01/2022.  There was large food residue in the stomach and drink adequate endoscopy however mild gastritis and hiatal hernia was able to be appreciated. -Patient did not wish to remain hospitalized for repeat EGD or any further procedures including possible colonoscopy.  Patient understood risks of discharging home with outpatient follow-up of unknown timing.  She was however hemodynamically stable and hemoglobin also remaining stable during trending.  Hgb at discharge 8.2 g/dL -Patient able to repeat risks and benefits of not finishing workup and patient and understands return instructions.  She will follow-up outpatient with GI for repeat scheduling of EGD and/or colonoscopy -Protonix twice daily continued at discharge  Chronic deep vein thrombosis (DVT) (HCC) - Patient has known history of DVT in left lower extremity.  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  Hypothyroidism Continue synthroid 75 mcg qhs    Principal Diagnosis: Heme positive stool  Discharge Diagnoses: Active Hospital Problems   Diagnosis Date Noted   Heme positive stool 09/30/2022    Priority: 1.   Chronic deep vein thrombosis (DVT) (HCC) 09/29/2022    Priority: 2.   Hypothyroidism 06/27/2007   Depression 06/27/2007    Resolved Hospital Problems  No resolved problems to display.     Discharge Instructions     Diet general   Complete by: As directed    Increase activity slowly   Complete by: As directed       Allergies as of 10/01/2022       Reactions   Gabapentin     Dizziness. States made her feel like she was drunk. Unsteady gait and dizziness.      Lidocaine Rash   Metoclopramide Rash   Other Other (See Comments)   DYE+Nitrofurantoin+Brilliant Blue Fcf   Sulfa Antibiotics Rash   Z-pak [azithromycin] Rash   Metoclopramide Hcl Other (See Comments)   REACTION: a drawing of all her muscles   Penicillins Other (See Comments)   Unknown    Procaine Hcl Other (See Comments)   Unknown    Macrodantin [nitrofurantoin Macrocrystal] Rash        Medication List     STOP taking these medications    Apixaban Starter Pack (10mg  and  5mg ) Commonly known as: ELIQUIS STARTER PACK   Eliquis 5 MG Tabs tablet Generic drug: apixaban   furosemide 20 MG tablet Commonly known as: LASIX   Vitamin D (Ergocalciferol) 1.25 MG (50000 UNIT) Caps capsule Commonly known as: DRISDOL       TAKE these medications    acetaminophen 500 MG tablet Commonly known as: TYLENOL Take 2 tablets (1,000 mg total) by mouth every 6 (six) hours as needed for moderate pain. What changed: when to take this   escitalopram 20 MG tablet Commonly known as:  LEXAPRO Take 20 mg by mouth daily.   levothyroxine 75 MCG tablet Commonly known as: SYNTHROID Take 75 mcg by mouth at bedtime.   pantoprazole 40 MG tablet Commonly known as: Protonix Take 1 tablet (40 mg total) by mouth 2 (two) times daily.   potassium chloride 10 MEQ tablet Commonly known as: KLOR-CON Take 10 mEq by mouth daily.        Follow-up Information     [name], MD. Schedule an appointment as soon as possible for a visit in 1 week(s).   Specialty: Gastroenterology Why: Need to reschedule EGD and/or colonoscopy Contact information: [address] [place] [address] [phone]                Allergies  Allergen Reactions   Gabapentin      Dizziness. States made her feel like she was drunk. Unsteady gait and dizziness.         Lidocaine Rash   Metoclopramide Rash   Other Other (See Comments)    DYE+Nitrofurantoin+Brilliant Blue Fcf   Sulfa Antibiotics Rash   Z-Pak [Azithromycin] Rash   Metoclopramide Hcl Other (See Comments)    REACTION: a drawing of all her muscles   Penicillins Other (See Comments)    Unknown    Procaine Hcl Other (See Comments)    Unknown    Macrodantin [Nitrofurantoin Macrocrystal] Rash    Consultations: GI Vascular surgery  Procedures: 4/27: EGD  Discharge Exam: BP (!) 111/41 (BP Location: Right Arm)   Pulse 72   Temp 97.6 F (36.4 C) (Oral)   Resp 17   Ht 5\' 4"  (1.626 m)   Wt 63 kg   SpO2 96%   BMI 23.86 kg/m  Physical Exam Constitutional:      General: She is not in acute distress.    Appearance: Normal appearance. She is not ill-appearing.  HENT:     Head: Normocephalic and atraumatic.     Mouth/Throat:     Mouth: Mucous membranes are moist.  Eyes:     Extraocular Movements: Extraocular movements intact.  Cardiovascular:     Rate and Rhythm: Normal rate and regular rhythm.  Pulmonary:     Effort: Pulmonary effort is normal.     Breath sounds: Normal breath sounds.  Abdominal:      General: Bowel sounds are normal. There is no distension.     Palpations: Abdomen is soft.     Tenderness: There is no abdominal tenderness.  Musculoskeletal:        General: Normal range of motion.     Cervical back: Normal range of motion and neck supple.  Skin:    General: Skin is warm and dry.  Neurological:     General: No focal deficit present.     Mental Status: She is alert.  Psychiatric:        Mood and Affect: Mood normal.      The results of significant diagnostics from  this hospitalization (including imaging, microbiology, ancillary and laboratory) are listed below for reference.   Microbiology: No results found for this or any previous visit (from the past 240 hour(s)).   Labs: BNP (last 3 results) Recent Labs    07/01/22 1131 09/29/22 1710  BNP 72.0 65.8   Basic Metabolic Panel: Recent Labs  Lab 09/29/22 1710 09/30/22 0900  NA 139 139  K 3.6 3.8  CL 102 106  CO2 28 27  GLUCOSE 115* 98  BUN 9 6*  CREATININE 0.68 0.73  CALCIUM 9.1 8.2*   Liver Function Tests: Recent Labs  Lab 09/30/22 0900  AST 14*  ALT 10  ALKPHOS 105  BILITOT 0.6  PROT 6.2*  ALBUMIN 3.1*   No results for input(s): "LIPASE", "AMYLASE" in the last 168 hours. No results for input(s): "AMMONIA" in the last 168 hours. CBC: Recent Labs  Lab 09/29/22 1710 09/30/22 0320 09/30/22 0900 09/30/22 1450 09/30/22 2110 10/01/22 0142  WBC 6.9  --   --   --   --  8.3  NEUTROABS  --   --   --   --   --  4.2  HGB 8.8* 8.1* 8.1* 8.0* 7.9* 8.2*  HCT 29.1* 26.1* 26.5* 27.3* 25.0* 27.0*  MCV 88.4  --   --   --   --  89.7  PLT 315  --   --   --   --  283   Cardiac Enzymes: No results for input(s): "CKTOTAL", "CKMB", "CKMBINDEX", "TROPONINI" in the last 168 hours. BNP: Invalid input(s): "POCBNP" CBG: No results for input(s): "GLUCAP" in the last 168 hours. D-Dimer No results for input(s): "DDIMER" in the last 72 hours. Hgb A1c No results for input(s): "HGBA1C" in the last 72  hours. Lipid Profile No results for input(s): "CHOL", "HDL", "LDLCALC", "TRIG", "CHOLHDL", "LDLDIRECT" in the last 72 hours. Thyroid function studies No results for input(s): "TSH", "T4TOTAL", "T3FREE", "THYROIDAB" in the last 72 hours.  Invalid input(s): "FREET3" Anemia work up No results for input(s): "VITAMINB12", "FOLATE", "FERRITIN", "TIBC", "IRON", "RETICCTPCT" in the last 72 hours. Urinalysis No results found for: "COLORURINE", "APPEARANCEUR", "LABSPEC", "PHURINE", "GLUCOSEU", "HGBUR", "BILIRUBINUR", "KETONESUR", "PROTEINUR", "UROBILINOGEN", "NITRITE", "LEUKOCYTESUR" Sepsis Labs Recent Labs  Lab 09/29/22 1710 10/01/22 0142  WBC 6.9 8.3   Microbiology No results found for this or any previous visit (from the past 240 hour(s)).  Procedures/Studies: VAS [place] LOWER EXTREMITY VENOUS (DVT)  Result Date: 10/01/2022  Lower Venous DVT Study Patient Name:  [facility]  Date of Exam:   09/30/2022 Medical Rec #: [id number]     Accession #:    [id number] Date of Birth: [birth year]     Patient Gender: F Patient Age:   82 years Exam Location:  [facility] Procedure:      VAS [place] LOWER EXTREMITY VENOUS (DVT) Referring Phys: Ivin Booty ROBINS --------------------------------------------------------------------------------  Indications: Edema, and History of DVT in left femoral vein on 09/29/22. Repeat study for possible IVC filter placement.  Performing Technologist: Marilynne Halsted RDMS, RVT  Examination Guidelines: A complete evaluation includes B-mode imaging, spectral Doppler, color Doppler, and power Doppler as needed of all accessible portions of each vessel. Bilateral testing is considered an integral part of a complete examination. Limited examinations for reoccurring indications may be performed as noted. The reflux portion of the exam is performed with the patient in reverse Trendelenburg.  +-----+---------------+---------+-----------+----------+--------------+  RIGHTCompressibilityPhasicitySpontaneityPropertiesThrombus Aging +-----+---------------+---------+-----------+----------+--------------+ CFV  Full           Yes  Yes                                 +-----+---------------+---------+-----------+----------+--------------+ SFJ  Full                                                        +-----+---------------+---------+-----------+----------+--------------+   +---------+---------------+---------+-----------+----------+-------------------+ LEFT     CompressibilityPhasicitySpontaneityPropertiesThrombus Aging      +---------+---------------+---------+-----------+----------+-------------------+ CFV      Full           Yes      Yes                                      +---------+---------------+---------+-----------+----------+-------------------+ SFJ      Full                                                             +---------+---------------+---------+-----------+----------+-------------------+ FV Prox  Full                                                             +---------+---------------+---------+-----------+----------+-------------------+ FV Mid   Full                                                             +---------+---------------+---------+-----------+----------+-------------------+ FV DistalFull           Yes      Yes                                      +---------+---------------+---------+-----------+----------+-------------------+ PFV      Full                                                             +---------+---------------+---------+-----------+----------+-------------------+ POP      Full           Yes      Yes                                      +---------+---------------+---------+-----------+----------+-------------------+ PTV      Full                                                              +---------+---------------+---------+-----------+----------+-------------------+  PERO                                                  one of the paired                                                         vein not clearly                                                          visualized          +---------+---------------+---------+-----------+----------+-------------------+ GSV      Partial                                      one of the paired                                                         veins               +---------+---------------+---------+-----------+----------+-------------------+ SSV      None                                         Chronic             +---------+---------------+---------+-----------+----------+-------------------+ LEIV                                                  Patent              +---------+---------------+---------+-----------+----------+-------------------+ LCIV                                                  Patent              +---------+---------------+---------+-----------+----------+-------------------+ Left femoral vein appears patent without evidence of thrombus.    Summary: RIGHT: - No evidence of common femoral vein obstruction.  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  *See table(s) above for measurements and observations. Electronically signed by Heath Lark on 10/01/2022 at 1:45:42 PM.    Final    US Venous Img Lower Bilateral (DVT)  Result Date: 09/29/2022 CLINICAL DATA:  Bilateral lower extremity edema. EXAM: BILATERAL LOWER EXTREMITY VENOUS DOPPLER ULTRASOUND TECHNIQUE: Gray-scale sonography with graded compression, as well  as color Doppler and duplex ultrasound were performed to evaluate the lower extremity deep venous  systems from the level of the common femoral vein and including the common femoral, femoral, profunda femoral, popliteal and calf veins including the posterior tibial, peroneal and gastrocnemius veins when visible. The superficial great saphenous vein was also interrogated. Spectral Doppler was utilized to evaluate flow at rest and with distal augmentation maneuvers in the common femoral, femoral and popliteal veins. COMPARISON:  August 24, 2022 FINDINGS: RIGHT LOWER EXTREMITY Common Femoral Vein: No evidence of thrombus. Normal compressibility, respiratory phasicity and response to augmentation. Saphenofemoral Junction: No evidence of thrombus. Normal compressibility and flow on color Doppler imaging. Profunda Femoral Vein: No evidence of thrombus. Normal compressibility and flow on color Doppler imaging. Femoral Vein: No evidence of thrombus. Normal compressibility, respiratory phasicity and response to augmentation. Popliteal Vein: No evidence of thrombus. Normal compressibility, respiratory phasicity and response to augmentation. Calf Veins: No evidence of thrombus. Normal compressibility and flow on color Doppler imaging. Superficial Great Saphenous Vein: No evidence of thrombus. Normal compressibility. Venous Reflux:  None. Other Findings:  None. LEFT LOWER EXTREMITY Common Femoral Vein: No evidence of thrombus. Normal compressibility, respiratory phasicity and response to augmentation. Saphenofemoral Junction: No evidence of thrombus. Normal compressibility and flow on color Doppler imaging. Profunda Femoral Vein: No evidence of thrombus. Normal compressibility and flow on color Doppler imaging. Femoral Vein: No evidence of nonocclusive thrombus with abnormal compressibility, respiratory phasicity and response to augmentation. Popliteal Vein: No evidence of thrombus. Normal compressibility, respiratory phasicity and response to augmentation. Calf Veins: No evidence of thrombus. Normal compressibility and flow  on color Doppler imaging. Superficial Great Saphenous Vein: No evidence of thrombus. Normal compressibility. Venous Reflux:  None. Other Findings:  None. IMPRESSION: 1. Nonocclusive DVT within the LEFT femoral vein. 2. No evidence of DVT within the RIGHT lower extremity. Electronically Signed   By: Aram Candela M.D.   On: 09/29/2022 20:34     Time coordinating discharge: Over 30 minutes    Lewie Chamber, MD  [facility] 10/01/2022, 3:43 PM

## 2022-10-01 NOTE — Plan of Care (Signed)
  Problem: Education: Goal: Knowledge of General Education information will improve Description: Including pain rating scale, medication(s)/side effects and non-pharmacologic comfort measures Outcome: Progressing   Problem: Activity: Goal: Risk for activity intolerance will decrease Outcome: Progressing   Problem: Coping: Goal: Level of anxiety will decrease Outcome: Progressing   

## 2022-10-01 NOTE — TOC Transition Note (Signed)
Transition of Care [facility]) - CM/SW Discharge Note   Patient Details  Name: Stefanie [name] MRN: [id number] Date of Birth: 25-Jan-1941  Transition of Care [facility]) CM/SW Contact:  Tom-Johnson, Hershal Coria, RN Phone Number: 10/01/2022, 4:29 PM   Clinical Narrative:     Patient is scheduled for discharge today.  Home health referral called in to [facility] and Tresa Endo voiced acceptance, info on AVS. Hospital f/u and discharge instructions on AVS.  Family to transport at discharge.  No further TOC needs noted.       Final next level of care: Home w Home Health Services Barriers to Discharge: Barriers Resolved   Patient Goals and CMS Choice CMS Medicare.gov Compare Post Acute Care list provided to:: Patient Choice offered to / list presented to : Patient  Discharge Placement                  Patient to be transferred to facility by: Family      Discharge Plan and Services Additional resources added to the After Visit Summary for                  DME Arranged: N/A DME Agency: NA       HH Arranged: PT, OT HH Agency: [facility] Date [facility] Agency Contacted: 10/01/22 Time HH Agency Contacted: 1620 Representative spoke with at [facility] Agency: Tresa Endo  Social Determinants of Health (SDOH) Interventions SDOH Screenings   Tobacco Use: Low Risk  (10/01/2022)     Readmission Risk Interventions     No data to display

## 2022-10-01 NOTE — Evaluation (Signed)
Physical Therapy Evaluation Patient Details Name: Stefanie [name] MRN: [id number] DOB: September 01, 1940 Today's Date: 10/01/2022  History of Present Illness  82 y.o. female admitted 4/25 for Acute GI bleeding heme positive stool. with PMH significant for hypothyroidism, GERD, anxiety/depression/fibromyalgia, arthritis.  Clinical Impression  Pt admitted with above diagnosis. Adamant that she will return home prior to her birthday tomorrow. Currently requires min assist for transfers and lives alone. Has an aide 5x/week to assist with all ADLs. Slightly disoriented, having trouble recalling name of her cat, and provides wrong year, with some decreased insight into safety/deficits. Ambulates 70 feet with RW at supervision level, no buckling. Most of her mobility challenge is with transferring. States she can arrange 24/7 assistance between friends/family/ and aides; this is unconfirmed but she is making phone calls this morning to attempt set-up. Ultimately if family/friends/aides can provide min assist at home she should be safe in that environment if deemed medically stable. Would benefit from HHPT in this scenario. Otherwise if she is going to be left alone at home, SNF would be a preferable dispo. Will continue to work with patient during admission to progress. Pt currently with functional limitations due to the deficits listed below (see PT Problem List). Pt will benefit from acute skilled PT to increase their independence and safety with mobility to allow discharge.          Recommendations for follow up therapy are one component of a multi-disciplinary discharge planning process, led by the attending physician.  Recommendations may be updated based on patient status, additional functional criteria and insurance authorization.     Assistance Recommended at Discharge Frequent or constant Supervision/Assistance  Patient can return home with the following  A little help with walking and/or transfers;A little  help with bathing/dressing/bathroom;Assistance with cooking/housework;Direct supervision/assist for medications management;Direct supervision/assist for financial management;Assist for transportation    Equipment Recommendations None recommended by PT (Declines RW, has rollator she prefers to use at home.)  Recommendations for Other Services  OT consult    Functional Status Assessment Patient has had a recent decline in their functional status and demonstrates the ability to make significant improvements in function in a reasonable and predictable amount of time.     Precautions / Restrictions Precautions Precautions: Fall Required Braces or Orthoses: Other Brace Other Brace: Wears CAM boot from prior injury Restrictions Weight Bearing Restrictions: No      Mobility  Bed Mobility Overal bed mobility: Needs Assistance Bed Mobility: Supine to Sit     Supine to sit: Mod assist, HOB elevated     General bed mobility comments: Mod assist for LE and trunk suppor to rise from bed, cues for technique, HOB elevated. Pt states she sleeps in lift chair, does not get in her bed at home.    Transfers Overall transfer level: Needs assistance Equipment used: Rolling walker (2 wheels) Transfers: Sit to/from Stand Sit to Stand: Min assist           General transfer comment: min assist for boost to stand from bed, min guard from recliner. Requires extra time, bracing knees on back of chair/bed and demonstrates posterior instability. Cues for technique and safety.    Ambulation/Gait Ambulation/Gait assistance: Min guard Gait Distance (Feet): 70 Feet Assistive device: Rolling walker (2 wheels) Gait Pattern/deviations: Step-through pattern, Decreased stride length, Shuffle Gait velocity: decr Gait velocity interpretation: <1.31 ft/sec, indicative of household ambulator   General Gait Details: Educated on safe AD use with RW for support, pt does not like device and  prefers her rollator  at home despite the extra support and stability provided from this device. Cues for upright posture, forward gaze, and larger step length due to shuffle. does respond well to larger step cues. No overt buckling noted. Mild unsteadiness with RW but able to self correct on device.  Stairs            Wheelchair Mobility    Modified Rankin (Stroke Patients Only)       Balance Overall balance assessment: Needs assistance Sitting-balance support: No upper extremity supported, Feet supported Sitting balance-Leahy Scale: Fair   Postural control: Posterior lean Standing balance support: Single extremity supported Standing balance-Leahy Scale: Poor                               Pertinent Vitals/Pain Pain Assessment Pain Assessment: Faces Faces Pain Scale: Hurts a little bit Pain Location: Lt ankle Pain Descriptors / Indicators: Aching Pain Intervention(s): Monitored during session, Repositioned    Home Living Family/patient expects to be discharged to:: Private residence Living Arrangements: Alone Available Help at Discharge: Family;Friend(s);Personal care attendant (Reports she can arrange 24/7 care (unconfirmed)) Type of Home: House Home Access: Level entry       Home Layout: One level Home Equipment: Rollator (4 wheels);Cane - single point;BSC/3in1;Toilet riser;Electric scooter Additional Comments: Has aides 5x/week up to 4-5 hrs per day. They assist with bird-bath, dressing, cooking, and shoping. Pt not forthcoming about how she gets around on her own when aides are not present but states friends can help. Sleeps in her lift chair.    Prior Function Prior Level of Function : Needs assist             Mobility Comments: Uses rollator to ambulate in home. States she cannot donne/doff her CAM boot, someone helps her with this. Denies recent falls, even when she injured ankle, states she just turned on it wrong. ADLs Comments: Aides assist with all ADLs.      Hand Dominance        Extremity/Trunk Assessment   Upper Extremity Assessment Upper Extremity Assessment: Defer to OT evaluation    Lower Extremity Assessment Lower Extremity Assessment: Generalized weakness (LLE edematous, as noted in prior entries.)       Communication   Communication: No difficulties  Cognition Arousal/Alertness: Awake/alert Behavior During Therapy: WFL for tasks assessed/performed Overall Cognitive Status: No family/caregiver present to determine baseline cognitive functioning                                 General Comments: Some difficulty recalling simple facts like her cats name initially. Disoriented to year. States 2025, then 2023. Reoriented. Aware of self, location, situation, and month. Decreased safety insight.        General Comments General comments (skin integrity, edema, etc.): Educated on deficits, safety awareness, need for increased care at home if she declines skilled care    Exercises     Assessment/Plan    PT Assessment Patient needs continued PT services  PT Problem List Decreased strength;Decreased balance;Decreased range of motion;Decreased activity tolerance;Decreased mobility;Decreased cognition;Decreased knowledge of use of DME;Decreased safety awareness;Decreased knowledge of precautions       PT Treatment Interventions DME instruction;Gait training;Stair training;Functional mobility training;Therapeutic activities;Therapeutic exercise;Balance training;Neuromuscular re-education;Patient/family education;Cognitive remediation    PT Goals (Current goals can be found in the Care Plan section)  Acute Rehab PT Goals Patient  Stated Goal: Go home as soon as possible PT Goal Formulation: With patient Time For Goal Achievement: 10/14/22 Potential to Achieve Goals: Good    Frequency Min 3X/week     Co-evaluation               AM-PAC PT "6 Clicks" Mobility  Outcome Measure Help needed turning from  your back to your side while in a flat bed without using bedrails?: A Little Help needed moving from lying on your back to sitting on the side of a flat bed without using bedrails?: A Lot Help needed moving to and from a bed to a chair (including a wheelchair)?: A Little Help needed standing up from a chair using your arms (e.g., wheelchair or bedside chair)?: A Little Help needed to walk in hospital room?: A Little Help needed climbing 3-5 steps with a railing? : A Lot 6 Click Score: 16    End of Session Equipment Utilized During Treatment: Gait belt Activity Tolerance: Patient tolerated treatment well Patient left: in chair;with call bell/phone within reach;with chair alarm set Nurse Communication: Mobility status PT Visit Diagnosis: Unsteadiness on feet (R26.81);Other abnormalities of gait and mobility (R26.89);Muscle weakness (generalized) (M62.81);Difficulty in walking, not elsewhere classified (R26.2)    Time: 1610-9604 PT Time Calculation (min) (ACUTE ONLY): 54 min   Charges:   PT Evaluation $PT Eval Low Complexity: 1 Low PT Treatments $Gait Training: 8-22 mins $Therapeutic Activity: 23-37 mins        Kathlyn Sacramento, PT, DPT Physical Therapist Acute Rehabilitation Services [facility] [phone]   [place] Mount 10/01/2022, 11:01 AM

## 2022-10-01 NOTE — Progress Notes (Signed)
PT Cancellation Note  Patient Details Name: Stefanie [name] MRN: [id number] DOB: 1941/06/01   Cancelled Treatment:    Reason Eval/Treat Not Completed: Patient at procedure or test/unavailable  Currently off unit for EGD. Will check back this afternoon for PT evaluation.  Kathlyn Sacramento, PT, DPT Physical Therapist Acute Rehabilitation Services [facility] [phone]  10/01/2022, 8:09 AM

## 2022-10-01 NOTE — Evaluation (Signed)
Occupational Therapy Evaluation Patient Details Name: Stefanie [name] MRN: [id number] DOB: 28-Feb-1941 Today's Date: 10/01/2022   History of Present Illness 82 y.o. female admitted 4/25 for Acute GI bleeding heme positive stool. with PMH significant for hypothyroidism, GERD, anxiety/depression/fibromyalgia, arthritis.   Clinical Impression   PTA pt lives alone however has a PCA 5 hrs/day 5 days/wk with assists with ADL and IADL tasks.  Pt states she has assistance from family and friends as needed. At baseline she sleeps/lives out of her lift chair and uses her rollator to ambulate to the bathroom and around the house. She is able to complete her ADL tasks with use of DME and AE; She has a fall alert system. Recommend DC home with HHOT. Recommend pt ambulate with staff using a rollator.      Recommendations for follow up therapy are one component of a multi-disciplinary discharge planning process, led by the attending physician.  Recommendations may be updated based on patient status, additional functional criteria and insurance authorization.   Assistance Recommended at Discharge Frequent or constant Supervision/Assistance  Patient can return home with the following A little help with bathing/dressing/bathroom;Assistance with cooking/housework;Assist for transportation    Functional Status Assessment  Patient has had a recent decline in their functional status and demonstrates the ability to make significant improvements in function in a reasonable and predictable amount of time.  Equipment Recommendations  None recommended by OT    Recommendations for Other Services       Precautions / Restrictions Precautions Precautions: Fall Required Braces or Orthoses: Other Brace Other Brace: Wears CAM boot from prior injury Restrictions Weight Bearing Restrictions: No      Mobility Bed Mobility               General bed mobility comments: OOB in chair    Transfers Overall transfer  level: Needs assistance Equipment used: Rolling walker (2 wheels) Transfers: Sit to/from Stand Sit to Stand: Min assist                  Balance Overall balance assessment: Needs assistance Sitting-balance support: No upper extremity supported, Feet supported Sitting balance-Leahy Scale: Fair   Postural control: Posterior lean Standing balance support: Single extremity supported Standing balance-Leahy Scale: Poor                             ADL either performed or assessed with clinical judgement   ADL Overall ADL's : Needs assistance/impaired                                     Functional mobility during ADLs: Minimal assistance (to stand form chair; uses lift chair adn raised [employer]) General ADL Comments: uses a reacher and long handled sponge to assist with ADL tasks; has fall alert eystme     Vision         Perception     Praxis      Pertinent Vitals/Pain Pain Assessment Pain Assessment: Faces Faces Pain Scale: Hurts a little bit Pain Location: Lt ankle Pain Descriptors / Indicators: Aching Pain Intervention(s): Limited activity within patient's tolerance     Hand Dominance Right   Extremity/Trunk Assessment Upper Extremity Assessment Upper Extremity Assessment: Generalized weakness   Lower Extremity Assessment Lower Extremity Assessment: Generalized weakness (LLE edematous, as noted in prior entries.)       Communication Communication Communication:  No difficulties   Cognition Arousal/Alertness: Awake/alert Behavior During Therapy: WFL for tasks assessed/performed Overall Cognitive Status: No family/caregiver present to determine baseline cognitive functioning                                 General Comments: scored a 4 on the Short Blessed scrren, which places her [facility]; endorses STM prblems at baseline     General Comments  Educated on deficits, safety awareness, need for increased care at home  if she declines skilled care    Exercises     Shoulder Instructions      Home Living Family/patient expects to be discharged to:: Private residence Living Arrangements: Alone Available Help at Discharge: Family;Friend(s);Personal care attendant (Reports she can arrange 24/7 care (unconfirmed)) Type of Home: House Home Access: Level entry     Home Layout: One level     Bathroom Shower/Tub: Chief Strategy Officer: Handicapped height Bathroom Accessibility: Yes How Accessible: Accessible via walker (rollator) Home Equipment: Rollator (4 wheels);Cane - single point;BSC/3in1;Toilet riser;Electric scooter   Additional Comments: Has aides 5x/week up to 4-5 hrs per day. They assist with bird-bath, dressing, cooking, and shoping. Pt not forthcoming about how she gets around on her own when aides are not present but states friends can help. Sleeps in her lift chair.      Prior Functioning/Environment Prior Level of Function : Needs assist             Mobility Comments: Uses rollator to ambulate in home. States she cannot donne/doff her CAM boot, someone helps her with this. Denies recent falls, even when she injured ankle, states she just turned on it wrong. ADLs Comments: Aides assist with all ADLs; pt uses AE to complete ADL when aide not helping her; she manages her cat/s food/water with AE; friends help with IADL tasks; grocerys are delivered adn aide assists with cooking; grand daughter and friends also assist; pt does her own finanacial and medication management with help of grand daughter if needed        OT Problem List: Decreased strength;Impaired balance (sitting and/or standing)      OT Treatment/Interventions:      OT Goals(Current goals can be found in the care plan section) Acute Rehab OT Goals Patient Stated Goal: home today OT Goal Formulation: With patient Time For Goal Achievement: 10/15/22 Potential to Achieve Goals: Good  OT Frequency:       Co-evaluation              AM-PAC OT "6 Clicks" Daily Activity     Outcome Measure Help from another person eating meals?: None Help from another person taking care of personal grooming?: None Help from another person toileting, which includes using toliet, bedpan, or urinal?: A Little Help from another person bathing (including washing, rinsing, drying)?: A Little Help from another person to put on and taking off regular upper body clothing?: A Little Help from another person to put on and taking off regular lower body clothing?: A Little 6 Click Score: 20   End of Session Equipment Utilized During Treatment: Gait belt;Rolling walker (2 wheels) Nurse Communication: Mobility status;Other (comment) (DC needs)  Activity Tolerance: Patient tolerated treatment well Patient left: in chair;with call bell/phone within reach;with chair alarm set  OT Visit Diagnosis: Unsteadiness on feet (R26.81);Muscle weakness (generalized) (M62.81)                Time:  1610-9604 OT Time Calculation (min): 36 min Charges:  OT General Charges $OT Visit: 1 Visit OT Evaluation $OT Eval Moderate Complexity: 1 Mod OT Treatments $Self Care/Home Management : 8-22 mins  Luisa Dago, OT/L   Acute OT Clinical Specialist Acute Rehabilitation Services Pager [phone] Office [phone]   [facility], [facility], [facility] 10/01/2022, 11:52 AM

## 2022-10-01 NOTE — Progress Notes (Signed)
Emmanuelle Asquith to be D/C'd home with [facility] per MD order. Discussed with the patient and all questions fully answered.  Skin clean, dry and intact without evidence of skin break down, no evidence of skin tears noted.  IV catheter discontinued intact. Site without signs and symptoms of complications. Dressing and pressure applied.  An After Visit Summary was printed and given to the patient.  Patient escorted via WC, and D/C home via private auto.  Jon Gills  10/01/2022 2:38 PM

## 2022-10-03 ENCOUNTER — Telehealth: Payer: Self-pay

## 2022-10-03 ENCOUNTER — Encounter (HOSPITAL_COMMUNITY): Payer: Self-pay | Admitting: Gastroenterology

## 2022-10-03 NOTE — Telephone Encounter (Signed)
-----   Message from [name], MD sent at 10/01/2022  8:42 AM EDT ----- Patient needs outpatient follow-up - next available for GI bleeding hospital follow-up.  Thanks.  KLB

## 2022-10-03 NOTE — Telephone Encounter (Signed)
F/u appt on 6/3 @1 :30pm with Victorino Dike. Letter mailed

## 2022-10-03 NOTE — Telephone Encounter (Signed)
Pt needs follow up for post hosp GI bleed

## 2022-10-22 ENCOUNTER — Encounter (HOSPITAL_COMMUNITY): Payer: Self-pay | Admitting: Family Medicine

## 2022-10-22 ENCOUNTER — Other Ambulatory Visit: Payer: Self-pay

## 2022-10-22 ENCOUNTER — Inpatient Hospital Stay (HOSPITAL_COMMUNITY)
Admission: EM | Admit: 2022-10-22 | Discharge: 2022-10-27 | DRG: 301 | Disposition: A | Discharge status: Skilled Nursing Facility | Payer: Medicare Other | Attending: Family Medicine | Admitting: Family Medicine

## 2022-10-22 ENCOUNTER — Emergency Department (HOSPITAL_COMMUNITY): Payer: Medicare Other

## 2022-10-22 DIAGNOSIS — Z882 Allergy status to sulfonamides status: Secondary | ICD-10-CM

## 2022-10-22 DIAGNOSIS — W19XXXD Unspecified fall, subsequent encounter: Secondary | ICD-10-CM | POA: Diagnosis not present

## 2022-10-22 DIAGNOSIS — I82402 Acute embolism and thrombosis of unspecified deep veins of left lower extremity: Secondary | ICD-10-CM | POA: Diagnosis not present

## 2022-10-22 DIAGNOSIS — I10 Essential (primary) hypertension: Secondary | ICD-10-CM | POA: Diagnosis present

## 2022-10-22 DIAGNOSIS — I82432 Acute embolism and thrombosis of left popliteal vein: Principal | ICD-10-CM | POA: Diagnosis present

## 2022-10-22 DIAGNOSIS — Z79899 Other long term (current) drug therapy: Secondary | ICD-10-CM

## 2022-10-22 DIAGNOSIS — Z881 Allergy status to other antibiotic agents status: Secondary | ICD-10-CM

## 2022-10-22 DIAGNOSIS — K219 Gastro-esophageal reflux disease without esophagitis: Secondary | ICD-10-CM | POA: Diagnosis present

## 2022-10-22 DIAGNOSIS — I82492 Acute embolism and thrombosis of other specified deep vein of left lower extremity: Secondary | ICD-10-CM | POA: Diagnosis not present

## 2022-10-22 DIAGNOSIS — Z79890 Hormone replacement therapy (postmenopausal): Secondary | ICD-10-CM | POA: Diagnosis not present

## 2022-10-22 DIAGNOSIS — M797 Fibromyalgia: Secondary | ICD-10-CM | POA: Diagnosis present

## 2022-10-22 DIAGNOSIS — E038 Other specified hypothyroidism: Secondary | ICD-10-CM

## 2022-10-22 DIAGNOSIS — Z90710 Acquired absence of both cervix and uterus: Secondary | ICD-10-CM | POA: Diagnosis not present

## 2022-10-22 DIAGNOSIS — I82442 Acute embolism and thrombosis of left tibial vein: Secondary | ICD-10-CM | POA: Diagnosis present

## 2022-10-22 DIAGNOSIS — Y92002 Bathroom of unspecified non-institutional (private) residence single-family (private) house as the place of occurrence of the external cause: Secondary | ICD-10-CM

## 2022-10-22 DIAGNOSIS — Z883 Allergy status to other anti-infective agents status: Secondary | ICD-10-CM | POA: Diagnosis not present

## 2022-10-22 DIAGNOSIS — Z1152 Encounter for screening for COVID-19: Secondary | ICD-10-CM

## 2022-10-22 DIAGNOSIS — Z9049 Acquired absence of other specified parts of digestive tract: Secondary | ICD-10-CM | POA: Diagnosis not present

## 2022-10-22 DIAGNOSIS — F32A Depression, unspecified: Secondary | ICD-10-CM | POA: Diagnosis present

## 2022-10-22 DIAGNOSIS — I824Y2 Acute embolism and thrombosis of unspecified deep veins of left proximal lower extremity: Secondary | ICD-10-CM | POA: Diagnosis not present

## 2022-10-22 DIAGNOSIS — Z888 Allergy status to other drugs, medicaments and biological substances status: Secondary | ICD-10-CM

## 2022-10-22 DIAGNOSIS — D649 Anemia, unspecified: Secondary | ICD-10-CM | POA: Diagnosis present

## 2022-10-22 DIAGNOSIS — E039 Hypothyroidism, unspecified: Secondary | ICD-10-CM | POA: Diagnosis present

## 2022-10-22 DIAGNOSIS — W010XXA Fall on same level from slipping, tripping and stumbling without subsequent striking against object, initial encounter: Secondary | ICD-10-CM | POA: Diagnosis present

## 2022-10-22 DIAGNOSIS — W19XXXA Unspecified fall, initial encounter: Principal | ICD-10-CM

## 2022-10-22 DIAGNOSIS — I82462 Acute embolism and thrombosis of left calf muscular vein: Secondary | ICD-10-CM | POA: Diagnosis present

## 2022-10-22 DIAGNOSIS — I82812 Embolism and thrombosis of superficial veins of left lower extremities: Secondary | ICD-10-CM | POA: Diagnosis present

## 2022-10-22 DIAGNOSIS — Z8249 Family history of ischemic heart disease and other diseases of the circulatory system: Secondary | ICD-10-CM

## 2022-10-22 DIAGNOSIS — I82452 Acute embolism and thrombosis of left peroneal vein: Secondary | ICD-10-CM | POA: Diagnosis present

## 2022-10-22 DIAGNOSIS — F419 Anxiety disorder, unspecified: Secondary | ICD-10-CM | POA: Diagnosis present

## 2022-10-22 DIAGNOSIS — Z5941 Food insecurity: Secondary | ICD-10-CM | POA: Diagnosis not present

## 2022-10-22 DIAGNOSIS — Z880 Allergy status to penicillin: Secondary | ICD-10-CM | POA: Diagnosis not present

## 2022-10-22 DIAGNOSIS — I82562 Chronic embolism and thrombosis of left calf muscular vein: Secondary | ICD-10-CM | POA: Diagnosis present

## 2022-10-22 LAB — CBC WITH DIFFERENTIAL/PLATELET
Abs Immature Granulocytes: 0.03 10*3/uL (ref 0.00–0.07)
Basophils Absolute: 0.1 10*3/uL (ref 0.0–0.1)
Basophils Relative: 1 %
Eosinophils Absolute: 0.1 10*3/uL (ref 0.0–0.5)
Eosinophils Relative: 1 %
HCT: 28.1 % — ABNORMAL LOW (ref 36.0–46.0)
Hemoglobin: 8.2 g/dL — ABNORMAL LOW (ref 12.0–15.0)
Immature Granulocytes: 0 %
Lymphocytes Relative: 14 %
Lymphs Abs: 1.2 10*3/uL (ref 0.7–4.0)
MCH: 25.9 pg — ABNORMAL LOW (ref 26.0–34.0)
MCHC: 29.2 g/dL — ABNORMAL LOW (ref 30.0–36.0)
MCV: 88.9 fL (ref 80.0–100.0)
Monocytes Absolute: 0.7 10*3/uL (ref 0.1–1.0)
Monocytes Relative: 8 %
Neutro Abs: 6.5 10*3/uL (ref 1.7–7.7)
Neutrophils Relative %: 76 %
Platelets: 333 10*3/uL (ref 150–400)
RBC: 3.16 MIL/uL — ABNORMAL LOW (ref 3.87–5.11)
RDW: 15.8 % — ABNORMAL HIGH (ref 11.5–15.5)
WBC: 8.7 10*3/uL (ref 4.0–10.5)
nRBC: 0 % (ref 0.0–0.2)

## 2022-10-22 LAB — URINALYSIS, ROUTINE W REFLEX MICROSCOPIC
Bilirubin Urine: NEGATIVE
Glucose, UA: NEGATIVE mg/dL
Hgb urine dipstick: NEGATIVE
Ketones, ur: NEGATIVE mg/dL
Leukocytes,Ua: NEGATIVE
Nitrite: NEGATIVE
Protein, ur: NEGATIVE mg/dL
Specific Gravity, Urine: 1.01 (ref 1.005–1.030)
pH: 7 (ref 5.0–8.0)

## 2022-10-22 LAB — BASIC METABOLIC PANEL
Anion gap: 8 (ref 5–15)
BUN: 10 mg/dL (ref 8–23)
CO2: 27 mmol/L (ref 22–32)
Calcium: 8.7 mg/dL — ABNORMAL LOW (ref 8.9–10.3)
Chloride: 103 mmol/L (ref 98–111)
Creatinine, Ser: 0.63 mg/dL (ref 0.44–1.00)
GFR, Estimated: >60 mL/min (ref >60)
Glucose, Bld: 120 mg/dL — ABNORMAL HIGH (ref 70–99)
Potassium: 3.6 mmol/L (ref 3.5–5.1)
Sodium: 138 mmol/L (ref 135–145)

## 2022-10-22 LAB — HEPARIN LEVEL (UNFRACTIONATED): Heparin Unfractionated: 0.31 IU/mL (ref 0.30–0.70)

## 2022-10-22 MED ORDER — OXYCODONE HCL 5 MG PO TABS
5.0000 mg | ORAL_TABLET | ORAL | Status: DC | PRN
  Administered 2022-10-23 – 2022-10-27 (×6): 5 mg via ORAL
  Filled 2022-10-22 (×6): qty 1

## 2022-10-22 MED ORDER — ESCITALOPRAM OXALATE 10 MG PO TABS
20.0000 mg | ORAL_TABLET | Freq: Every day | ORAL | Status: DC
  Administered 2022-10-22 – 2022-10-27 (×5): 20 mg via ORAL
  Filled 2022-10-22 (×6): qty 2

## 2022-10-22 MED ORDER — ACETAMINOPHEN 325 MG PO TABS
650.0000 mg | ORAL_TABLET | Freq: Four times a day (QID) | ORAL | Status: DC | PRN
  Administered 2022-10-25 – 2022-10-26 (×2): 650 mg via ORAL
  Filled 2022-10-22 (×2): qty 2

## 2022-10-22 MED ORDER — HEPARIN (PORCINE) 25000 UT/250ML-% IV SOLN
1050.0000 [IU]/h | INTRAVENOUS | Status: AC
  Administered 2022-10-22: 1000 [IU]/h via INTRAVENOUS
  Administered 2022-10-23: 1050 [IU]/h via INTRAVENOUS
  Filled 2022-10-22 (×2): qty 250

## 2022-10-22 MED ORDER — ONDANSETRON HCL 4 MG/2ML IJ SOLN: 4.0000 mg | Freq: Four times a day (QID) | INTRAMUSCULAR | Status: DC | PRN

## 2022-10-22 MED ORDER — SODIUM CHLORIDE 0.9% FLUSH: 3.0000 mL | INTRAVENOUS | Status: DC | PRN

## 2022-10-22 MED ORDER — HEPARIN BOLUS VIA INFUSION
3500.0000 [IU] | Freq: Once | INTRAVENOUS | Status: AC
  Administered 2022-10-22: 3500 [IU] via INTRAVENOUS

## 2022-10-22 MED ORDER — PANTOPRAZOLE SODIUM 40 MG PO TBEC
40.0000 mg | DELAYED_RELEASE_TABLET | Freq: Two times a day (BID) | ORAL | Status: DC
  Administered 2022-10-22 – 2022-10-23 (×4): 40 mg via ORAL
  Filled 2022-10-22 (×4): qty 1

## 2022-10-22 MED ORDER — BISACODYL 10 MG RE SUPP: 10.0000 mg | Freq: Every day | RECTAL | Status: DC | PRN

## 2022-10-22 MED ORDER — HYDROCODONE-ACETAMINOPHEN 5-325 MG PO TABS
1.0000 | ORAL_TABLET | Freq: Once | ORAL | Status: AC
  Administered 2022-10-22: 1 via ORAL
  Filled 2022-10-22: qty 1

## 2022-10-22 MED ORDER — SODIUM CHLORIDE 0.9% FLUSH
3.0000 mL | Freq: Two times a day (BID) | INTRAVENOUS | Status: DC
  Administered 2022-10-22 – 2022-10-27 (×8): 3 mL via INTRAVENOUS

## 2022-10-22 MED ORDER — LEVOTHYROXINE SODIUM 75 MCG PO TABS
75.0000 ug | ORAL_TABLET | Freq: Every day | ORAL | Status: DC
  Administered 2022-10-22 – 2022-10-26 (×5): 75 ug via ORAL
  Filled 2022-10-22 (×5): qty 1

## 2022-10-22 MED ORDER — POLYETHYLENE GLYCOL 3350 17 G PO PACK: 17.0000 g | PACK | Freq: Every day | ORAL | Status: DC | PRN

## 2022-10-22 MED ORDER — METHOCARBAMOL 500 MG PO TABS
500.0000 mg | ORAL_TABLET | Freq: Three times a day (TID) | ORAL | Status: AC
  Administered 2022-10-22 – 2022-10-25 (×8): 500 mg via ORAL
  Filled 2022-10-22 (×8): qty 1

## 2022-10-22 MED ORDER — SODIUM CHLORIDE 0.9% FLUSH
3.0000 mL | Freq: Two times a day (BID) | INTRAVENOUS | Status: DC
  Administered 2022-10-22 – 2022-10-27 (×8): 3 mL via INTRAVENOUS

## 2022-10-22 MED ORDER — TRAZODONE HCL 50 MG PO TABS
50.0000 mg | ORAL_TABLET | Freq: Every evening | ORAL | Status: DC | PRN
  Administered 2022-10-22: 50 mg via ORAL
  Filled 2022-10-22: qty 1

## 2022-10-22 MED ORDER — POLYETHYLENE GLYCOL 3350 17 G PO PACK
17.0000 g | PACK | Freq: Every day | ORAL | Status: DC
  Administered 2022-10-22 – 2022-10-27 (×4): 17 g via ORAL
  Filled 2022-10-22 (×5): qty 1

## 2022-10-22 MED ORDER — ONDANSETRON HCL 4 MG PO TABS: 4.0000 mg | ORAL_TABLET | Freq: Four times a day (QID) | ORAL | Status: DC | PRN

## 2022-10-22 MED ORDER — ACETAMINOPHEN 650 MG RE SUPP: 650.0000 mg | Freq: Four times a day (QID) | RECTAL | Status: DC | PRN

## 2022-10-22 MED ORDER — SODIUM CHLORIDE 0.9 % IV SOLN: INTRAVENOUS | Status: DC | PRN

## 2022-10-22 MED ORDER — SENNOSIDES-DOCUSATE SODIUM 8.6-50 MG PO TABS
2.0000 | ORAL_TABLET | Freq: Every day | ORAL | Status: DC
  Administered 2022-10-22 – 2022-10-26 (×5): 2 via ORAL
  Filled 2022-10-22 (×5): qty 2

## 2022-10-22 NOTE — Progress Notes (Signed)
ANTICOAGULATION CONSULT NOTE - Follow Up Consult  Pharmacy Consult for Heparin Indication: New DVT  Allergies  Allergen Reactions   Gabapentin      Dizziness. States made her feel like she was drunk. Unsteady gait and dizziness.         Lidocaine Rash   Metoclopramide Rash   Other Other (See Comments)    DYE+Nitrofurantoin+Brilliant Blue Fcf   Sulfa Antibiotics Rash   Z-Pak [Azithromycin] Rash   Metoclopramide Hcl Other (See Comments)    REACTION: a drawing of all her muscles   Penicillins Other (See Comments)    Unknown    Procaine Hcl Other (See Comments)    Unknown    Macrodantin [Nitrofurantoin Macrocrystal] Rash    Patient Measurements: Height: 5\' 4"  (162.6 cm) Weight: 71.7 kg (158 lb 1.1 oz) IBW/kg (Calculated) : 54.7 Heparin Dosing Weight: 63 kg  Vital Signs: Temp: 98.4 F (36.9 C) (05/18 1700) Temp Source: Oral (05/18 1700) BP: 128/61 (05/18 1700) Pulse Rate: 91 (05/18 1700)  Labs: Recent Labs    10/22/22 1111 10/22/22 2247  HGB 8.2*  --   HCT 28.1*  --   PLT 333  --   HEPARINUNFRC  --  0.31  CREATININE 0.63  --      Estimated Creatinine Clearance: 52.6 mL/min (by C-G formula based on SCr of 0.63 mg/dL).  Assessment: 82 year old female with new acute occlusive deep venous thrombus seen in the left popliteal, posterior tibial, and peroneal veins. Hx of prior DVT previously treated with Eliquis (no longer taking). Pharmacy consulted to initiate heparin therapy.  Heparin level at low end of therapeutic (0.31) on infusion at 1000 units/hr. No bleeding noted.  Goal of Therapy:  Heparin level 0.3-0.7 units/ml Monitor platelets by anticoagulation protocol: Yes   Plan:  Increase heparin drip slightly to 1050 units/hr to keep in therapeutic range Heparin level in 8 hours  Christoper Fabian, PharmD, BCPS Please see amion for complete clinical pharmacist phone list 10/22/2022 11:33 PM

## 2022-10-22 NOTE — H&P (Signed)
Patient Demographics:    Stefanie [name], is a 82 y.o. 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 -Patient's brother also has recurrent DVTs -Left lower extremity venous Dopplers with new acute deep vein thrombosis in the left popliteal, posterior tibial and left peroneal veins these are new compared to venous Dopplers from 09/29/2022 -Recent venous Dopplers of the left lower extremity from 09/29/2022, as well as the study from 08/24/2022, 07/05/2022 and  07/01/2022 were all reviewed with on-call radiologist --The Doppler finding today of acute new DVT appears to be extensive and occlusive-- --concerns about recent heme positive stool with incomplete GI workup and recurrent falls -Risk versus benefits of anticoagulation discussed with patient and patient's granddaughter [name] at [phone]----- -Will get IR consult to see if she is a candidate for IVC -In the meantime start IV heparin -If no bleeding may transition to Eliquis, she was previously on Eliquis probably 6 months -Given recurrent DVT and strong family history of DVT she will need full anticoagulation indefinitely or IVC - 2)GERD--recent heme positive stool -Incomplete EGD on 09/30/2025 due to poor prep -Patient declines further endoluminal evaluation -Continue Protonix -Watch for bleeding while on anticoagulation  3) chronic anemia---hemoglobin of 8.2 which is close to his baseline, monitor closely while on full anticoagulation  4)Depression Continue lexapro 20 mg qhs -May use trazodone nightly as needed    5)Hypothyroidism Continue synthroid 75 mcg qhs  6) status post mechanical fall/ambulatory dysfunction----keep patient on telemetry monitored unit to watch for possible arrhythmias  -CT head and CT C-spine without acute finding -Chest x-ray, left ankle and right shoulder x-rays without acute findings -Prior to admission patient lived alone and ambulated with a walker she had a boot on the left leg due to recent Achilles tendon injury -Very high risk for falls which is very concerning while on full anticoagulation -Get PT eval  7) social/ethics----plan of care and advanced directive discussed with patient and  patient's granddaughter [name] at [phone] -- Patient is a full code -She would like her granddaughter [name] to be decision-maker if she is no longer able to make any decisions for herself  Status is: Inpatient    Dispo: The patient is from: Home              Anticipated d/c is to:  Await physical therapy eval              Anticipated d/c date is: 2 days              Patient currently is not medically stable to d/c. Barriers: Not Clinically Stable-   With History of - Reviewed by me  Past Medical History:  Diagnosis  Date   Anxiety    Arthritis    Colon polyps    Depression    Diverticulosis of colon (without mention of hemorrhage)    Fibromyalgia    GERD (gastroesophageal reflux disease)    Hiatal hernia    History of IBS    History of kidney stones    Hypothyroid    Hypothyroidism       Past Surgical History:  Procedure Laterality Date   ABDOMINAL HYSTERECTOMY     ANKLE SURGERY     bilateral    APPENDECTOMY  1985   BREAST BIOPSY Left    Benign    CHOLECYSTECTOMY  1985   ESOPHAGEAL MANOMETRY N/A 10/22/2012   Procedure: ESOPHAGEAL MANOMETRY (EM);  Surgeon: Mardella Layman, MD;  Location: [facility];  Service: Endoscopy;  Laterality: N/A;   ESOPHAGOGASTRODUODENOSCOPY N/A 10/01/2022   Procedure: ESOPHAGOGASTRODUODENOSCOPY (EGD);   Surgeon: [name], MD;  Location[facility] ENDOS[facility];  Service: Gastroenterology;  Laterality: N/A;   EXTRACORPOREAL SHOCK WAVE LITHOTRIPSY Left 11/26/2018   Procedure: EXTRACORPOREAL SHOCK WAVE LITHOTRIPSY (ESWL);  Surgeon: Marcine Matar, MD;  Location: [facility] ORS;  Service: Urology;  Laterality: Left;   HEMORRHOID SURGERY     RECTOCELE REPAIR     TUBAL LIGATION      Chief Complaint  Patient presents with   Fall      HPI:    Stefanie [name]  is a 82 y.o. 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  Complains of right shoulder area pain , she is not sure if she hit her head -Denies chest pain palpitations or dizziness -Denies loss of consciousness headache visual disturbance or any focal extremity weakness or numbness -She does have chronic left lower extremity swelling from recurrent DVTs and recent Achilles tendon injury-- -CT head and CT C-spine without acute finding -Chest x-ray, left ankle and right shoulder x-rays without acute findings -Left lower extremity venous Dopplers with new acute deep vein thrombosis in the left popliteal, posterior tibial and left peroneal veins these are new compared to venous Dopplers from 09/29/2022 -UA without evidence of UTI -CBC with WBC of 8.7 hemoglobin of 8.2 which is close to his baseline, platelets 333 -Creatinine 0.63, potassium 3.6, sodium 138 -Additional history obtained from patient's granddaughter [name] at [phone] No fever   Or chills    Review of systems:    In addition to the HPI above,   A full Review of  Systems was done, all other systems reviewed are negative except as noted above in HPI , .    Social History:  Reviewed by me    Social History   Tobacco Use   Smoking status: Never   Smokeless tobacco: Never  Substance Use Topics   Alcohol use: No    Alcohol/week: 0.0 standard drinks of alcohol    Family History :  Reviewed by me    Family History  Problem Relation Age of Onset   Colon polyps Brother    Breast cancer Neg  Hx      Home Medications:   Prior to Admission medications   Medication Sig Start Date End Date Taking? Authorizing Provider  Vitamin D, Ergocalciferol, (DRISDOL) 1.25 MG (50000 UNIT) CAPS capsule TAKE 1 CAPSULE ONCE A WEEK 10/05/22  Yes [provider]  acetaminophen (TYLENOL) 500 MG tablet Take 2 tablets (1,000 mg total) by mouth every 6 (six) hours as needed for moderate pain. 10/01/22   Lewie Chamber, MD  escitalopram (LEXAPRO) 20 MG tablet Take 20 mg by mouth daily.    [provider]  levothyroxine (SYNTHROID, LEVOTHROID) 75 MCG tablet Take 75 mcg by mouth at bedtime.    [provider]  pantoprazole (PROTONIX) 40 MG tablet Take 1 tablet (40 mg total) by mouth 2 (two) times daily. 10/01/22 10/01/23  Lewie Chamber, MD  potassium chloride (KLOR-CON) 10 MEQ tablet Take 10 mEq by mouth daily. 08/17/22   [provider]     Allergies:     Allergies  Allergen Reactions   Gabapentin      Dizziness. States made her feel like she was drunk. Unsteady gait and dizziness.         Lidocaine Rash   Metoclopramide Rash   Other Other (See Comments)    DYE+Nitrofurantoin+Brilliant Blue Fcf   Sulfa Antibiotics Rash   Z-Pak [Azithromycin] Rash   Metoclopramide Hcl Other (See Comments)    REACTION: a drawing of all her muscles   Penicillins Other (See Comments)    Unknown    Procaine Hcl Other (See Comments)    Unknown     Macrodantin [Nitrofurantoin Macrocrystal] Rash     Physical Exam:   Vitals  Blood pressure 128/61, pulse 91, temperature 98.4 F (36.9 C), temperature source Oral, resp. rate 17, height 5\' 4"  (1.626 m), weight 71.7 kg, SpO2 97 %.  Physical Examination: General appearance - alert,  in no distress  Mental status - alert, oriented to person, place, and time,  Eyes - sclera anicteric Neck - supple, no JVD elevation , Chest - clear  to auscultation bilaterally, symmetrical air movement,  Heart - S1 and S2 normal, regular  Abdomen - soft, nontender, nondistended, +BS Neurological - screening mental status exam normal, neck supple without rigidity, cranial nerves II through XII intact, DTR's normal and symmetric Extremities - intact peripheral pulses  Skin - warm, dry MSK--left leg swelling tenderness warmth but no significant redness or cellulitic type signs -Painful range of motion especially around the ankle    Data Review:    CBC Recent Labs  Lab 10/22/22 1111  WBC 8.7  HGB 8.2*  HCT 28.1*  PLT 333  MCV 88.9  MCH 25.9*  MCHC 29.2*  RDW 15.8*  LYMPHSABS 1.2  MONOABS 0.7  EOSABS 0.1  BASOSABS 0.1   ------------------------------------------------------------------------------------------------------------------  Chemistries  Recent Labs  Lab 10/22/22 1111  NA 138  K 3.6  CL 103  CO2 27  GLUCOSE 120*  BUN 10  CREATININE 0.63  CALCIUM 8.7*   ------------------------------------------------------------------------------------------------------------------ estimated creatinine clearance is 52.6 mL/min (by C-G formula based on SCr of 0.63 mg/dL). ------------------------------------------------------------------------------------------------------------------ No results for input(s): "TSH", "T4TOTAL", "T3FREE", "THYROIDAB" in the last 72 hours.  Invalid input(s): "FREET3"   Coagulation profile No results for input(s): "INR", "PROTIME" in the last 168  hours. ------------------------------------------------------------------------------------------------------------------- No results for input(s): "DDIMER" in the last 72 hours. -------------------------------------------------------------------------------------------------------------------  Cardiac Enzymes No results for input(s): "CKMB", "TROPONINI", "MYOGLOBIN" in the last 168 hours.  Invalid input(s): "CK" ------------------------------------------------------------------------------------------------------------------    Component Value Date/Time  BNP 65.8 09/29/2022 1710     ---------------------------------------------------------------------------------------------------------------  Urinalysis    Component Value Date/Time   COLORURINE YELLOW 10/22/2022 1253   APPEARANCEUR CLEAR 10/22/2022 1253   LABSPEC 1.010 10/22/2022 1253   PHURINE 7.0 10/22/2022 1253   GLUCOSEU NEGATIVE 10/22/2022 1253   HGBUR NEGATIVE 10/22/2022 1253   BILIRUBINUR NEGATIVE 10/22/2022 1253   KETONESUR NEGATIVE 10/22/2022 1253   PROTEINUR NEGATIVE 10/22/2022 1253   NITRITE NEGATIVE 10/22/2022 1253   LEUKOCYTESUR NEGATIVE 10/22/2022 1253    ----------------------------------------------------------------------------------------------------------------   Imaging Results:    US Venous Img Lower  Left (DVT Study)  Result Date: 10/22/2022 CLINICAL DATA:  Left leg pain.  Fall today. EXAM: LEFT LOWER EXTREMITY VENOUS DOPPLER ULTRASOUND TECHNIQUE: Gray-scale sonography with compression, as well as color and duplex ultrasound, were performed to evaluate the deep venous system(s) from the level of the common femoral vein through the popliteal and proximal calf veins. COMPARISON:  09/29/2022 FINDINGS: VENOUS Occlusive thrombus still seen within the left popliteal, posterior tibial and peroneal veins. Otherwise, normal compressibility of the common femoral and femoral veins. Visualized portions of profunda  femoral vein and great saphenous vein unremarkable. No additional filling defects to suggest DVT on grayscale or color Doppler imaging. Doppler waveforms show normal direction of venous flow, normal respiratory plasticity and response to augmentation in the above knee veins. Limited views of the contralateral common femoral vein are unremarkable. OTHER None. Limitations: none IMPRESSION: New acute occlusive deep venous thrombus seen in the left popliteal, posterior tibial, and peroneal veins. Electronically Signed   By: Acquanetta Belling M.D.   On: 10/22/2022 12:24   CT Head Wo Contrast  Result Date: 10/22/2022 CLINICAL DATA:  82 year old female with head and neck pain following fall. Initial encounter. EXAM: CT HEAD WITHOUT CONTRAST CT CERVICAL SPINE WITHOUT CONTRAST TECHNIQUE: Multidetector CT imaging of the head and cervical spine was performed following the standard protocol without intravenous contrast. Multiplanar CT image reconstructions of the cervical spine were also generated. RADIATION DOSE REDUCTION: This exam was performed according to the departmental dose-optimization program which includes automated exposure control, adjustment of the mA and/or kV according to patient size and/or use of iterative reconstruction technique. COMPARISON:  08/15/2021 CTs and prior studies FINDINGS: CT HEAD FINDINGS Brain: No evidence of acute infarction, hemorrhage, hydrocephalus, extra-axial collection or mass lesion/mass effect. Atrophy and chronic small-vessel white matter ischemic changes again noted. Vascular: Carotid and vertebral atherosclerotic calcifications are noted. Skull: Normal. Negative for fracture or focal lesion. Sinuses/Orbits: No acute finding. Other: None. CT CERVICAL SPINE FINDINGS Alignment: Reversal of the normal cervical lordosis again identified. Minimal anterolisthesis at C3-4, C4-5, C5-6 and at C7-T1 again noted. No new subluxation identified. Skull base and vertebrae: No acute fracture. No  primary bone lesion or focal pathologic process. Soft tissues and spinal canal: No prevertebral fluid or swelling. No visible canal hematoma. Disc levels: No acute abnormality. Degenerative changes/ankylosis at C1-2 again noted as well as moderate multilevel facet arthropathy. Mild multilevel degenerative disc disease/spondylosis again identified. Upper chest: No acute abnormality. Other: None IMPRESSION: 1. No evidence of acute intracranial abnormality. Atrophy and chronic small-vessel white matter ischemic changes. 2. No static evidence of acute injury to the cervical spine. Multilevel degenerative changes again noted. Electronically Signed   By: Harmon Pier M.D.   On: 10/22/2022 12:07   CT Cervical Spine Wo Contrast  Result Date: 10/22/2022 CLINICAL DATA:  82 year old female with head and neck pain following fall. Initial encounter. EXAM: CT HEAD WITHOUT CONTRAST CT CERVICAL SPINE WITHOUT  CONTRAST TECHNIQUE: Multidetector CT imaging of the head and cervical spine was performed following the standard protocol without intravenous contrast. Multiplanar CT image reconstructions of the cervical spine were also generated. RADIATION DOSE REDUCTION: This exam was performed according to the departmental dose-optimization program which includes automated exposure control, adjustment of the mA and/or kV according to patient size and/or use of iterative reconstruction technique. COMPARISON:  08/15/2021 CTs and prior studies FINDINGS: CT HEAD FINDINGS Brain: No evidence of acute infarction, hemorrhage, hydrocephalus, extra-axial collection or mass lesion/mass effect. Atrophy and chronic small-vessel white matter ischemic changes again noted. Vascular: Carotid and vertebral atherosclerotic calcifications are noted. Skull: Normal. Negative for fracture or focal lesion. Sinuses/Orbits: No acute finding. Other: None. CT CERVICAL SPINE FINDINGS Alignment: Reversal of the normal cervical lordosis again identified. Minimal  anterolisthesis at C3-4, C4-5, C5-6 and at C7-T1 again noted. No new subluxation identified. Skull base and vertebrae: No acute fracture. No primary bone lesion or focal pathologic process. Soft tissues and spinal canal: No prevertebral fluid or swelling. No visible canal hematoma. Disc levels: No acute abnormality. Degenerative changes/ankylosis at C1-2 again noted as well as moderate multilevel facet arthropathy. Mild multilevel degenerative disc disease/spondylosis again identified. Upper chest: No acute abnormality. Other: None IMPRESSION: 1. No evidence of acute intracranial abnormality. Atrophy and chronic small-vessel white matter ischemic changes. 2. No static evidence of acute injury to the cervical spine. Multilevel degenerative changes again noted. Electronically Signed   By: Harmon Pier M.D.   On: 10/22/2022 12:07   DG Ankle 2 Views Left  Result Date: 10/22/2022 CLINICAL DATA:  Fall.  Pain. EXAM: LEFT ANKLE - 2 VIEW COMPARISON:  08/27/2022 FINDINGS: AP and cross-table lateral views. No oblique/mortise view performed. Diffuse soft tissue swelling. Osteopenia. Bone anchor within the calcaneus. Heterogeneity about the posterior calcaneus is likely due to remote trauma and/or surgery. Artifact degradation degrades the lateral view. Presumably from clothing or bedding material. IMPRESSION: Limited two view exam with artifact on the lateral view. Diffuse soft tissue swelling with postsurgical and likely remote posttraumatic changes. No gross acute osseous abnormality. If ongoing clinical concern, recommend repeat with standard three-view series. Electronically Signed   By: Jeronimo Greaves M.D.   On: 10/22/2022 12:06   DG Shoulder Right  Result Date: 10/22/2022 CLINICAL DATA:  Fall.  Pain. EXAM: RIGHT SHOULDER - 2+ VIEW COMPARISON:  None Available. FINDINGS: High-riding humeral head, consistent with chronic rotator cuff insufficiency. No acute fracture or dislocation. Visualized portion of the right  hemithorax is normal. IMPRESSION: No acute osseous abnormality. Electronically Signed   By: Jeronimo Greaves M.D.   On: 10/22/2022 12:03   DG Chest 1 View  Result Date: 10/22/2022 CLINICAL DATA:  Right-sided fall and pain. EXAM: CHEST  1 VIEW COMPARISON:  07/01/2022 FINDINGS: Oblique portable radiograph with multiple wires and leads overlying the chest. Midline trachea. Mild cardiomegaly. No right and no large left pleural effusion. No pneumothorax. No congestive failure. Mild right hemidiaphragm elevation. IMPRESSION: No acute findings.  Mild limitations as detailed above. Cardiomegaly and low lung volumes. Electronically Signed   By: Jeronimo Greaves M.D.   On: 10/22/2022 12:02    Radiological Exams on Admission: US Venous Img Lower  Left (DVT Study)  Result Date: 10/22/2022 CLINICAL DATA:  Left leg pain.  Fall today. EXAM: LEFT LOWER EXTREMITY VENOUS DOPPLER ULTRASOUND TECHNIQUE: Gray-scale sonography with compression, as well as color and duplex ultrasound, were performed to evaluate the deep venous system(s) from the level of the common femoral vein through the  popliteal and proximal calf veins. COMPARISON:  09/29/2022 FINDINGS: VENOUS Occlusive thrombus still seen within the left popliteal, posterior tibial and peroneal veins. Otherwise, normal compressibility of the common femoral and femoral veins. Visualized portions of profunda femoral vein and great saphenous vein unremarkable. No additional filling defects to suggest DVT on grayscale or color Doppler imaging. Doppler waveforms show normal direction of venous flow, normal respiratory plasticity and response to augmentation in the above knee veins. Limited views of the contralateral common femoral vein are unremarkable. OTHER None. Limitations: none IMPRESSION: New acute occlusive deep venous thrombus seen in the left popliteal, posterior tibial, and peroneal veins. Electronically Signed   By: Acquanetta Belling M.D.   On: 10/22/2022 12:24   CT Head Wo  Contrast  Result Date: 10/22/2022 CLINICAL DATA:  82 year old female with head and neck pain following fall. Initial encounter. EXAM: CT HEAD WITHOUT CONTRAST CT CERVICAL SPINE WITHOUT CONTRAST TECHNIQUE: Multidetector CT imaging of the head and cervical spine was performed following the standard protocol without intravenous contrast. Multiplanar CT image reconstructions of the cervical spine were also generated. RADIATION DOSE REDUCTION: This exam was performed according to the departmental dose-optimization program which includes automated exposure control, adjustment of the mA and/or kV according to patient size and/or use of iterative reconstruction technique. COMPARISON:  08/15/2021 CTs and prior studies FINDINGS: CT HEAD FINDINGS Brain: No evidence of acute infarction, hemorrhage, hydrocephalus, extra-axial collection or mass lesion/mass effect. Atrophy and chronic small-vessel white matter ischemic changes again noted. Vascular: Carotid and vertebral atherosclerotic calcifications are noted. Skull: Normal. Negative for fracture or focal lesion. Sinuses/Orbits: No acute finding. Other: None. CT CERVICAL SPINE FINDINGS Alignment: Reversal of the normal cervical lordosis again identified. Minimal anterolisthesis at C3-4, C4-5, C5-6 and at C7-T1 again noted. No new subluxation identified. Skull base and vertebrae: No acute fracture. No primary bone lesion or focal pathologic process. Soft tissues and spinal canal: No prevertebral fluid or swelling. No visible canal hematoma. Disc levels: No acute abnormality. Degenerative changes/ankylosis at C1-2 again noted as well as moderate multilevel facet arthropathy. Mild multilevel degenerative disc disease/spondylosis again identified. Upper chest: No acute abnormality. Other: None IMPRESSION: 1. No evidence of acute intracranial abnormality. Atrophy and chronic small-vessel white matter ischemic changes. 2. No static evidence of acute injury to the cervical spine.  Multilevel degenerative changes again noted. Electronically Signed   By: Harmon Pier M.D.   On: 10/22/2022 12:07   CT Cervical Spine Wo Contrast  Result Date: 10/22/2022 CLINICAL DATA:  83 year old female with head and neck pain following fall. Initial encounter. EXAM: CT HEAD WITHOUT CONTRAST CT CERVICAL SPINE WITHOUT CONTRAST TECHNIQUE: Multidetector CT imaging of the head and cervical spine was performed following the standard protocol without intravenous contrast. Multiplanar CT image reconstructions of the cervical spine were also generated. RADIATION DOSE REDUCTION: This exam was performed according to the departmental dose-optimization program which includes automated exposure control, adjustment of the mA and/or kV according to patient size and/or use of iterative reconstruction technique. COMPARISON:  08/15/2021 CTs and prior studies FINDINGS: CT HEAD FINDINGS Brain: No evidence of acute infarction, hemorrhage, hydrocephalus, extra-axial collection or mass lesion/mass effect. Atrophy and chronic small-vessel white matter ischemic changes again noted. Vascular: Carotid and vertebral atherosclerotic calcifications are noted. Skull: Normal. Negative for fracture or focal lesion. Sinuses/Orbits: No acute finding. Other: None. CT CERVICAL SPINE FINDINGS Alignment: Reversal of the normal cervical lordosis again identified. Minimal anterolisthesis at C3-4, C4-5, C5-6 and at C7-T1 again noted. No new subluxation identified. Skull base  and vertebrae: No acute fracture. No primary bone lesion or focal pathologic process. Soft tissues and spinal canal: No prevertebral fluid or swelling. No visible canal hematoma. Disc levels: No acute abnormality. Degenerative changes/ankylosis at C1-2 again noted as well as moderate multilevel facet arthropathy. Mild multilevel degenerative disc disease/spondylosis again identified. Upper chest: No acute abnormality. Other: None IMPRESSION: 1. No evidence of acute intracranial  abnormality. Atrophy and chronic small-vessel white matter ischemic changes. 2. No static evidence of acute injury to the cervical spine. Multilevel degenerative changes again noted. Electronically Signed   By: Harmon Pier M.D.   On: 10/22/2022 12:07   DG Ankle 2 Views Left  Result Date: 10/22/2022 CLINICAL DATA:  Fall.  Pain. EXAM: LEFT ANKLE - 2 VIEW COMPARISON:  08/27/2022 FINDINGS: AP and cross-table lateral views. No oblique/mortise view performed. Diffuse soft tissue swelling. Osteopenia. Bone anchor within the calcaneus. Heterogeneity about the posterior calcaneus is likely due to remote trauma and/or surgery. Artifact degradation degrades the lateral view. Presumably from clothing or bedding material. IMPRESSION: Limited two view exam with artifact on the lateral view. Diffuse soft tissue swelling with postsurgical and likely remote posttraumatic changes. No gross acute osseous abnormality. If ongoing clinical concern, recommend repeat with standard three-view series. Electronically Signed   By: Jeronimo Greaves M.D.   On: 10/22/2022 12:06   DG Shoulder Right  Result Date: 10/22/2022 CLINICAL DATA:  Fall.  Pain. EXAM: RIGHT SHOULDER - 2+ VIEW COMPARISON:  None Available. FINDINGS: High-riding humeral head, consistent with chronic rotator cuff insufficiency. No acute fracture or dislocation. Visualized portion of the right hemithorax is normal. IMPRESSION: No acute osseous abnormality. Electronically Signed   By: Jeronimo Greaves M.D.   On: 10/22/2022 12:03   DG Chest 1 View  Result Date: 10/22/2022 CLINICAL DATA:  Right-sided fall and pain. EXAM: CHEST  1 VIEW COMPARISON:  07/01/2022 FINDINGS: Oblique portable radiograph with multiple wires and leads overlying the chest. Midline trachea. Mild cardiomegaly. No right and no large left pleural effusion. No pneumothorax. No congestive failure. Mild right hemidiaphragm elevation. IMPRESSION: No acute findings.  Mild limitations as detailed above. Cardiomegaly  and low lung volumes. Electronically Signed   By: Jeronimo Greaves M.D.   On: 10/22/2022 12:02    DVT Prophylaxis -IV heparin AM Labs Ordered, also please review Full Orders  Family Communication: Admission, patients condition and plan of care including tests being ordered have been discussed with the patient and  patient's granddaughter [name] at [phone] who indicate understanding and agree with the plan   Condition   -stable Shon Hale M.D on 10/22/2022 at 6:31 PM Go to [email or web address] -  for contact info  [facility] - Office  [phone]

## 2022-10-22 NOTE — ED Provider Notes (Signed)
[place] EMERGENCY DEPARTMENT AT [facility] Provider Note   CSN: [id number] Arrival date & time: 10/22/22  1044     History  Chief Complaint  Patient presents with   Stefanie [name] is a 82 y.o. female.  She is brought in by ambulance from home.  She lives alone.  She said she was using her walker in the bathroom when she slipped and fell hitting her head and injuring her right shoulder.  She was unable to get up and so she pushed her medic alert button.  She denies any loss of consciousness.  She is not on blood thinners.  She is having chronic pain in her left ankle from a fall last month.  She continues to use the walking boot.  She says she has not followed up with anybody because they did not tell her who to see.  She denies any neck or back pain no chest or abdominal pain.  The history is provided by the patient and the EMS personnel.  Fall This is a new problem. The current episode started 1 to 2 hours ago. The problem has not changed since onset.Pertinent negatives include no chest pain, no abdominal pain, no headaches and no shortness of breath. Associated symptoms comments: R shoulder pain. The symptoms are aggravated by bending and twisting. Nothing relieves the symptoms. She has tried nothing for the symptoms. The treatment provided no relief.       Home Medications Prior to Admission medications   Medication Sig Start Date End Date Taking? Authorizing Provider  acetaminophen (TYLENOL) 500 MG tablet Take 2 tablets (1,000 mg total) by mouth every 6 (six) hours as needed for moderate pain. 10/01/22   Lewie Chamber, MD  escitalopram (LEXAPRO) 20 MG tablet Take 20 mg by mouth daily.    [provider]  levothyroxine (SYNTHROID, LEVOTHROID) 75 MCG tablet Take 75 mcg by mouth at bedtime.    [provider]  pantoprazole (PROTONIX) 40 MG tablet Take 1 tablet (40 mg total) by mouth 2 (two) times daily. 10/01/22 10/01/23  Lewie Chamber, MD   potassium chloride (KLOR-CON) 10 MEQ tablet Take 10 mEq by mouth daily. 08/17/22   [provider]      Allergies    Gabapentin, Lidocaine, Metoclopramide, Other, Sulfa antibiotics, Z-pak [azithromycin], Metoclopramide hcl, Penicillins, Procaine hcl, and Macrodantin [nitrofurantoin macrocrystal]    Review of Systems   Review of Systems  Constitutional:  Negative for fever.  Eyes:  Negative for visual disturbance.  Respiratory:  Negative for shortness of breath.   Cardiovascular:  Positive for leg swelling. Negative for chest pain.  Gastrointestinal:  Negative for abdominal pain.  Musculoskeletal:  Positive for gait problem. Negative for back pain.  Neurological:  Negative for headaches.    Physical Exam Updated Vital Signs BP 139/80   Pulse (!) 110   Temp 98 F (36.7 C) (Oral)   Resp 19   Ht 5\' 4"  (1.626 m)   Wt 63 kg   SpO2 95%   BMI 23.86 kg/m  Physical Exam Vitals and nursing note reviewed.  Constitutional:      General: She is not in acute distress.    Appearance: Normal appearance. She is well-developed.  HENT:     Head: Normocephalic and atraumatic.  Eyes:     Conjunctiva/sclera: Conjunctivae normal.  Cardiovascular:     Rate and Rhythm: Normal rate and regular rhythm.     Heart sounds: No murmur heard. Pulmonary:  Effort: Pulmonary effort is normal. No respiratory distress.     Breath sounds: Normal breath sounds.  Abdominal:     Palpations: Abdomen is soft.     Tenderness: There is no abdominal tenderness. There is no guarding or rebound.  Musculoskeletal:        General: Tenderness present. No deformity. Normal range of motion.     Cervical back: Neck supple.     Right lower leg: Edema present.     Left lower leg: Edema present.     Comments: She has some vague tenderness around her right shoulder.  There is full range of motion.  No elbow or wrist tenderness.  Left upper extremity full range of motion without any pain or limitations.  Right  lower extremity she has 1+ edema in her lower extremity full range of motion.  Left lower leg 2+ edema and some diffuse tenderness around her ankle.  No definite cords.  Distal pulses intact.  Skin:    General: Skin is warm and dry.     Capillary Refill: Capillary refill takes less than 2 seconds.  Neurological:     General: No focal deficit present.     Mental Status: She is alert.     Sensory: No sensory deficit.     Motor: No weakness.     ED Results / Procedures / Treatments   Labs (all labs ordered are listed, but only abnormal results are displayed) Labs Reviewed  BASIC METABOLIC PANEL - Abnormal; Notable for the following components:      Result Value   Glucose, Bld 120 (*)    Calcium 8.7 (*)    All other components within normal limits  CBC WITH DIFFERENTIAL/PLATELET - Abnormal; Notable for the following components:   RBC 3.16 (*)    Hemoglobin 8.2 (*)    HCT 28.1 (*)    MCH 25.9 (*)    MCHC 29.2 (*)    RDW 15.8 (*)    All other components within normal limits  URINALYSIS, ROUTINE W REFLEX MICROSCOPIC    EKG None  Radiology US Venous Img Lower  Left (DVT Study)  Result Date: 10/22/2022 CLINICAL DATA:  Left leg pain.  Fall today. EXAM: LEFT LOWER EXTREMITY VENOUS DOPPLER ULTRASOUND TECHNIQUE: Gray-scale sonography with compression, as well as color and duplex ultrasound, were performed to evaluate the deep venous system(s) from the level of the common femoral vein through the popliteal and proximal calf veins. COMPARISON:  09/29/2022 FINDINGS: VENOUS Occlusive thrombus still seen within the left popliteal, posterior tibial and peroneal veins. Otherwise, normal compressibility of the common femoral and femoral veins. Visualized portions of profunda femoral vein and great saphenous vein unremarkable. No additional filling defects to suggest DVT on grayscale or color Doppler imaging. Doppler waveforms show normal direction of venous flow, normal respiratory plasticity and  response to augmentation in the above knee veins. Limited views of the contralateral common femoral vein are unremarkable. OTHER None. Limitations: none IMPRESSION: New acute occlusive deep venous thrombus seen in the left popliteal, posterior tibial, and peroneal veins. Electronically Signed   By: Acquanetta Belling M.D.   On: 10/22/2022 12:24   CT Head Wo Contrast  Result Date: 10/22/2022 CLINICAL DATA:  82 year old female with head and neck pain following fall. Initial encounter. EXAM: CT HEAD WITHOUT CONTRAST CT CERVICAL SPINE WITHOUT CONTRAST TECHNIQUE: Multidetector CT imaging of the head and cervical spine was performed following the standard protocol without intravenous contrast. Multiplanar CT image reconstructions of the cervical spine were  also generated. RADIATION DOSE REDUCTION: This exam was performed according to the departmental dose-optimization program which includes automated exposure control, adjustment of the mA and/or kV according to patient size and/or use of iterative reconstruction technique. COMPARISON:  08/15/2021 CTs and prior studies FINDINGS: CT HEAD FINDINGS Brain: No evidence of acute infarction, hemorrhage, hydrocephalus, extra-axial collection or mass lesion/mass effect. Atrophy and chronic small-vessel white matter ischemic changes again noted. Vascular: Carotid and vertebral atherosclerotic calcifications are noted. Skull: Normal. Negative for fracture or focal lesion. Sinuses/Orbits: No acute finding. Other: None. CT CERVICAL SPINE FINDINGS Alignment: Reversal of the normal cervical lordosis again identified. Minimal anterolisthesis at C3-4, C4-5, C5-6 and at C7-T1 again noted. No new subluxation identified. Skull base and vertebrae: No acute fracture. No primary bone lesion or focal pathologic process. Soft tissues and spinal canal: No prevertebral fluid or swelling. No visible canal hematoma. Disc levels: No acute abnormality. Degenerative changes/ankylosis at C1-2 again noted as  well as moderate multilevel facet arthropathy. Mild multilevel degenerative disc disease/spondylosis again identified. Upper chest: No acute abnormality. Other: None IMPRESSION: 1. No evidence of acute intracranial abnormality. Atrophy and chronic small-vessel white matter ischemic changes. 2. No static evidence of acute injury to the cervical spine. Multilevel degenerative changes again noted. Electronically Signed   By: Harmon Pier M.D.   On: 10/22/2022 12:07   CT Cervical Spine Wo Contrast  Result Date: 10/22/2022 CLINICAL DATA:  82 year old female with head and neck pain following fall. Initial encounter. EXAM: CT HEAD WITHOUT CONTRAST CT CERVICAL SPINE WITHOUT CONTRAST TECHNIQUE: Multidetector CT imaging of the head and cervical spine was performed following the standard protocol without intravenous contrast. Multiplanar CT image reconstructions of the cervical spine were also generated. RADIATION DOSE REDUCTION: This exam was performed according to the departmental dose-optimization program which includes automated exposure control, adjustment of the mA and/or kV according to patient size and/or use of iterative reconstruction technique. COMPARISON:  08/15/2021 CTs and prior studies FINDINGS: CT HEAD FINDINGS Brain: No evidence of acute infarction, hemorrhage, hydrocephalus, extra-axial collection or mass lesion/mass effect. Atrophy and chronic small-vessel white matter ischemic changes again noted. Vascular: Carotid and vertebral atherosclerotic calcifications are noted. Skull: Normal. Negative for fracture or focal lesion. Sinuses/Orbits: No acute finding. Other: None. CT CERVICAL SPINE FINDINGS Alignment: Reversal of the normal cervical lordosis again identified. Minimal anterolisthesis at C3-4, C4-5, C5-6 and at C7-T1 again noted. No new subluxation identified. Skull base and vertebrae: No acute fracture. No primary bone lesion or focal pathologic process. Soft tissues and spinal canal: No prevertebral  fluid or swelling. No visible canal hematoma. Disc levels: No acute abnormality. Degenerative changes/ankylosis at C1-2 again noted as well as moderate multilevel facet arthropathy. Mild multilevel degenerative disc disease/spondylosis again identified. Upper chest: No acute abnormality. Other: None IMPRESSION: 1. No evidence of acute intracranial abnormality. Atrophy and chronic small-vessel white matter ischemic changes. 2. No static evidence of acute injury to the cervical spine. Multilevel degenerative changes again noted. Electronically Signed   By: Harmon Pier M.D.   On: 10/22/2022 12:07   DG Ankle 2 Views Left  Result Date: 10/22/2022 CLINICAL DATA:  Fall.  Pain. EXAM: LEFT ANKLE - 2 VIEW COMPARISON:  08/27/2022 FINDINGS: AP and cross-table lateral views. No oblique/mortise view performed. Diffuse soft tissue swelling. Osteopenia. Bone anchor within the calcaneus. Heterogeneity about the posterior calcaneus is likely due to remote trauma and/or surgery. Artifact degradation degrades the lateral view. Presumably from clothing or bedding material. IMPRESSION: Limited two view exam with artifact on  the lateral view. Diffuse soft tissue swelling with postsurgical and likely remote posttraumatic changes. No gross acute osseous abnormality. If ongoing clinical concern, recommend repeat with standard three-view series. Electronically Signed   By: Jeronimo Greaves M.D.   On: 10/22/2022 12:06   DG Shoulder Right  Result Date: 10/22/2022 CLINICAL DATA:  Fall.  Pain. EXAM: RIGHT SHOULDER - 2+ VIEW COMPARISON:  None Available. FINDINGS: High-riding humeral head, consistent with chronic rotator cuff insufficiency. No acute fracture or dislocation. Visualized portion of the right hemithorax is normal. IMPRESSION: No acute osseous abnormality. Electronically Signed   By: Jeronimo Greaves M.D.   On: 10/22/2022 12:03   DG Chest 1 View  Result Date: 10/22/2022 CLINICAL DATA:  Right-sided fall and pain. EXAM: CHEST  1 VIEW  COMPARISON:  07/01/2022 FINDINGS: Oblique portable radiograph with multiple wires and leads overlying the chest. Midline trachea. Mild cardiomegaly. No right and no large left pleural effusion. No pneumothorax. No congestive failure. Mild right hemidiaphragm elevation. IMPRESSION: No acute findings.  Mild limitations as detailed above. Cardiomegaly and low lung volumes. Electronically Signed   By: Jeronimo Greaves M.D.   On: 10/22/2022 12:02    Procedures Procedures    Medications Ordered in ED Medications  escitalopram (LEXAPRO) tablet 20 mg (20 mg Oral Given 10/22/22 1517)  levothyroxine (SYNTHROID) tablet 75 mcg (has no administration in time range)  pantoprazole (PROTONIX) EC tablet 40 mg (40 mg Oral Given 10/22/22 1517)  sodium chloride flush (NS) 0.9 % injection 3 mL (3 mLs Intravenous Given 10/22/22 1523)  sodium chloride flush (NS) 0.9 % injection 3 mL (0 mLs Intravenous Duplicate 10/22/22 1524)  sodium chloride flush (NS) 0.9 % injection 3 mL (has no administration in time range)  0.9 %  sodium chloride infusion (has no administration in time range)  acetaminophen (TYLENOL) tablet 650 mg (has no administration in time range)    Or  acetaminophen (TYLENOL) suppository 650 mg (has no administration in time range)  traZODone (DESYREL) tablet 50 mg (has no administration in time range)  bisacodyl (DULCOLAX) suppository 10 mg (has no administration in time range)  ondansetron (ZOFRAN) tablet 4 mg (has no administration in time range)    Or  ondansetron (ZOFRAN) injection 4 mg (has no administration in time range)  heparin ADULT infusion 100 units/mL (25000 units/249mL) (1,000 Units/hr Intravenous New Bag/Given 10/22/22 1529)  oxyCODONE (Oxy IR/ROXICODONE) immediate release tablet 5 mg (has no administration in time range)  methocarbamol (ROBAXIN) tablet 500 mg (500 mg Oral Given 10/22/22 1517)  polyethylene glycol (MIRALAX / GLYCOLAX) packet 17 g (17 g Oral Given 10/22/22 1517)  senna-docusate  (Senokot-S) tablet 2 tablet (has no administration in time range)  HYDROcodone-acetaminophen (NORCO/VICODIN) 5-325 MG per tablet 1 tablet (1 tablet Oral Given 10/22/22 1332)  heparin bolus via infusion 3,500 Units (3,500 Units Intravenous Bolus from Bag 10/22/22 1530)    ED Course/ Medical Decision Making/ A&P Clinical Course as of 10/22/22 1732  Sat Oct 22, 2022  1331 Her family member is here and says she is doing very poorly at home and is having a lot of difficulty getting around.  They are definitely hoping she gets admitted. [MB]  1343 Discussed with Dr. Mariea Clonts [facility] who will evaluate patient for admission. [MB]    Clinical Course User Index [MB] Terrilee Files, MD                             Medical Decision  Making Amount and/or Complexity of Data Reviewed Labs: ordered. Radiology: ordered.  Risk Prescription drug management. Decision regarding hospitalization.   This patient complains of fall head injury right shoulder pain, more chronic left lower leg pain and swelling; this involves an extensive number of treatment Options and is a complaint that carries with it a high risk of complications and morbidity. The differential includes fracture, contusion, dislocation, DVT, PE, cellulitis  I ordered, reviewed and interpreted labs, which included CBC with normal white count, low stable hemoglobin, chemistries unremarkable, urinalysis without signs of infection I ordered medication oral pain medication and reviewed PMP when indicated. I ordered imaging studies which included CT head and cervical spine, x-rays of left ankle right shoulder chest, duplex left lower extremity and I independently    visualized and interpreted imaging which showed acute DVT left lower extremity Additional history obtained from patient's grand daughter and EMS Previous records obtained and reviewed in epic, prior was seen for leg swelling and found to have a nonocclusive femoral DVT.  At  that point it was not recommended for anticoagulation or IVC filter.  She also had new anemia that was being worked up without clear source I consulted Dr. Mariea Clonts [facility] and discussed lab and imaging findings and discussed disposition.  Cardiac monitoring reviewed, sinus tachycardia Social determinants considered, food insecurity Critical Interventions: None  After the interventions stated above, I reevaluated the patient and found patient to be neuro intact in no distress Admission and further testing considered, patient would benefit from mission to the hospital for probable anticoagulation and may need further consideration of if she is safe for anticoagulation versus needs an IVC filter.  She also does not seem very stable on her feet and may benefit from physical therapy.         Final Clinical Impression(s) / ED Diagnoses Final diagnoses:  Fall, initial encounter  Acute deep vein thrombosis (DVT) of calf muscle vein of left lower extremity [facility])    Rx / DC Orders ED Discharge Orders     None         Terrilee Files, MD 10/22/22 1735

## 2022-10-22 NOTE — Progress Notes (Addendum)
ANTICOAGULATION CONSULT NOTE - Follow Up Consult  Pharmacy Consult for Heparin Indication: New DVT  Allergies  Allergen Reactions   Gabapentin      Dizziness. States made her feel like she was drunk. Unsteady gait and dizziness.         Lidocaine Rash   Metoclopramide Rash   Other Other (See Comments)    DYE+Nitrofurantoin+Brilliant Blue Fcf   Sulfa Antibiotics Rash   Z-Pak [Azithromycin] Rash   Metoclopramide Hcl Other (See Comments)    REACTION: a drawing of all her muscles   Penicillins Other (See Comments)    Unknown    Procaine Hcl Other (See Comments)    Unknown    Macrodantin [Nitrofurantoin Macrocrystal] Rash    Patient Measurements: Height: 5\' 4"  (162.6 cm) Weight: 63 kg (139 lb) IBW/kg (Calculated) : 54.7 Heparin Dosing Weight: 63 kg  Vital Signs: Temp: 98 F (36.7 C) (05/18 1059) Temp Source: Oral (05/18 1059) BP: 138/70 (05/18 1330) Pulse Rate: 110 (05/18 1054)  Labs: Recent Labs    10/22/22 1111  HGB 8.2*  HCT 28.1*  PLT 333  CREATININE 0.63    Estimated Creatinine Clearance: 46.8 mL/min (by C-G formula based on SCr of 0.63 mg/dL).  Assessment: 82 year old female with new acute occlusive deep venous thrombus seen in the left popliteal, posterior tibial, and peroneal veins. Hx of prior DVT previously treated with Eliquis (no longer taking). Pharmacy consulted to initiate heparin therapy.  Goal of Therapy:  Heparin level 0.3-0.7 units/ml Monitor platelets by anticoagulation protocol: Yes   Plan:  Heparin bolus 3500 units Heparin drip @ 1000 units/hr Heparin level in 8 hours Daily HL and CBC Monitor for any bleeding adverse events  Caryl Asp, PharmD Clinical Pharmacist 10/22/2022 1:57 PM

## 2022-10-22 NOTE — ED Triage Notes (Signed)
Pt arrived [facility] for c/o fall this morning. Pt was in bathroom with walker and fell onto right side. Pt stated she did hit her head, right shoulder pain and previous injury to left leg.

## 2022-10-22 NOTE — Progress Notes (Signed)
Request received for IVC filter placement.   Dr. Elby Showers en route to [facility] for emergent case, will review tomorrow.   Patient is 82 yo female with hypothyroidism, depression, prior DVT of her left leg in January 2024 was on Eliquis, recent admission 4/25-27 with heme positive stool and DVT. GI was consulted and patient underwent EGD, exam was limited due to large food residue in the stomach, no active bleeding was found. Further eval with colonoscopy was recommended but patient wished to go home and have colonoscopy scheduled as outpatient.  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   Patient is back to ED today due to fall, duplex study showing New acute occlusive deep venous thrombus seen in the left popliteal, posterior tibial, and peroneal veins.  Patient is hypertensive and tachycardic, O2 99% RA, hgb 8.2 (8.2 on 10/01/22.)  IR will follow.  Please call IR for questions and concerns.   Lynann Bologna Elfa Wooton PA-C 10/22/2022 2:58 PM

## 2022-10-22 NOTE — ED Notes (Signed)
Reported to [place] on [address]. Patient moved upstairs.

## 2022-10-23 DIAGNOSIS — I82402 Acute embolism and thrombosis of unspecified deep veins of left lower extremity: Secondary | ICD-10-CM | POA: Diagnosis not present

## 2022-10-23 DIAGNOSIS — I10 Essential (primary) hypertension: Secondary | ICD-10-CM | POA: Diagnosis not present

## 2022-10-23 DIAGNOSIS — W19XXXA Unspecified fall, initial encounter: Secondary | ICD-10-CM | POA: Diagnosis not present

## 2022-10-23 DIAGNOSIS — E038 Other specified hypothyroidism: Secondary | ICD-10-CM | POA: Diagnosis not present

## 2022-10-23 LAB — BASIC METABOLIC PANEL
Anion gap: 9 (ref 5–15)
BUN: 9 mg/dL (ref 8–23)
CO2: 26 mmol/L (ref 22–32)
Calcium: 8.5 mg/dL — ABNORMAL LOW (ref 8.9–10.3)
Chloride: 103 mmol/L (ref 98–111)
Creatinine, Ser: 0.64 mg/dL (ref 0.44–1.00)
GFR, Estimated: >60 mL/min (ref >60)
Glucose, Bld: 135 mg/dL — ABNORMAL HIGH (ref 70–99)
Potassium: 3.1 mmol/L — ABNORMAL LOW (ref 3.5–5.1)
Sodium: 138 mmol/L (ref 135–145)

## 2022-10-23 LAB — CBC
HCT: 25.9 % — ABNORMAL LOW (ref 36.0–46.0)
Hemoglobin: 7.5 g/dL — ABNORMAL LOW (ref 12.0–15.0)
MCH: 26 pg (ref 26.0–34.0)
MCHC: 29 g/dL — ABNORMAL LOW (ref 30.0–36.0)
MCV: 89.6 fL (ref 80.0–100.0)
Platelets: 308 10*3/uL (ref 150–400)
RBC: 2.89 MIL/uL — ABNORMAL LOW (ref 3.87–5.11)
RDW: 15.9 % — ABNORMAL HIGH (ref 11.5–15.5)
WBC: 7 10*3/uL (ref 4.0–10.5)
nRBC: 0 % (ref 0.0–0.2)

## 2022-10-23 LAB — HEPARIN LEVEL (UNFRACTIONATED): Heparin Unfractionated: 0.39 IU/mL (ref 0.30–0.70)

## 2022-10-23 MED ORDER — POTASSIUM CHLORIDE CRYS ER 20 MEQ PO TBCR
40.0000 meq | EXTENDED_RELEASE_TABLET | ORAL | Status: AC
  Administered 2022-10-23 (×2): 40 meq via ORAL
  Filled 2022-10-23 (×2): qty 2

## 2022-10-23 NOTE — Progress Notes (Signed)
ANTICOAGULATION CONSULT NOTE - Brief Note  Pharmacy Consult for Heparin Indication: New DVT  Heparin Dosing Weight: 63 kg  Assessment/plan: 82 year old female with new acute occlusive deep venous thrombus seen in the left popliteal, posterior tibial, and peroneal veins. Hx of prior DVT previously treated with Eliquis (no longer taking). Pharmacy consulted to initiate heparin therapy.  HL = 0.39  Repeat heparin level at goal (0.3-0.7)  Continue current regimen and f/u in am  Caryl Asp, PharmD Clinical Pharmacist 10/23/2022 9:02 AM

## 2022-10-23 NOTE — Progress Notes (Addendum)
IVC filter placement has been approved by Dr. Elby Showers.   MD/RN notified, asked [facility] team to arrange [facility] so that patient will be at [facility] by 8-8:30, the earlier the better.   NPO at [place] Stop heparin gtt at 4 am  Pt not allergic to contrast    Formal consult to follow when pt arrives at [facility].   Adream Parzych H Kathleene Bergemann PA-C 10/23/2022 10:00 AM

## 2022-10-23 NOTE — Progress Notes (Signed)
PROGRESS NOTE     Stefanie [name], is a 82 y.o. female, DOB - 09/26/1940, EAV:[id number]  Admit date - 10/22/2022   Admitting Physician Oceanna Arruda Mariea Clonts, MD  Outpatient Primary MD for the patient is [name], MD  LOS - 1  Chief Complaint  Patient presents with   Fall        Brief Narrative:  82 y.o. 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 -Patient's brother also has recurrent DVTs -Left lower extremity venous Dopplers with new acute deep vein thrombosis in the left popliteal, posterior tibial and left peroneal veins these are new compared to venous Dopplers from 09/29/2022 -Recent venous Dopplers of the left lower extremity from 09/29/2022, as well as the study from 08/24/2022, 07/05/2022 and  07/01/2022 were all reviewed with on-call radiologist -- Doppler finding this admission shows acute new acute extremity DVT that appears to be extensive and occlusive-- --concerns about recent heme positive stool with incomplete GI workup and recurrent falls -Risk versus benefits of anticoagulation discussed with patient and patient's granddaughter [name] at [phone]----- 10/23/22 - IR consult appreciated as per Dr. Marliss Coots she is a candidate for IVC -Plans for transfer to [facility] on 10/24/2022 for IVC procedure --c/n  IV heparin -If no bleeding may transition to Eliquis, she was previously on Eliquis probably 6 months -Given recurrent DVT and strong family history of DVT she will need full anticoagulation indefinitely, however if she gets IVC filter she will need shorter  duration of anticoagulation  - 2)GERD--recent heme positive stool -Incomplete EGD on 09/30/2025 due to poor prep -Patient declines further endoluminal evaluation -Continue Protonix -Watch for bleeding while on anticoagulation   3) chronic anemia---hemoglobin of 8.2 which is close to his baseline, monitor closely while on full anticoagulation   4)Depression Continue lexapro 20 mg qhs -May use trazodone nightly as needed   5)Hypothyroidism Continue synthroid 75 mcg qhs   6) status post mechanical fall/ambulatory dysfunction----keep patient on telemetry monitored unit to watch for possible arrhythmias  -CT head and CT C-spine without acute finding -Chest x-ray, left ankle and right shoulder x-rays without acute findings -Prior to admission patient lived alone and ambulated with a walker she had a boot on the left leg due to recent Achilles tendon injury -Very high risk for falls which is very concerning while on full anticoagulation -PT eval requested   7)Social/Ethics----plan of care and advanced directive discussed with patient and  patient's granddaughter [name] at [phone] -- Patient is a full code -She would like her granddaughter [name] to be decision-maker if she is no longer able to make any decisions for herself   Status is: Inpatient   Disposition: The patient is from: Home              Anticipated d/c is to: SNF              Anticipated d/c date is: 1 day              Patient currently is not medically stable to d/c. Barriers: Not Clinically Stable-   Code Status :  -  Code Status: Full Code   Family Communication:      (patient is alert, awake and coherent), Discussed with granddaughter [name] at [phone]  DVT Prophylaxis  :   - SCDs   SCDs Start: 10/22/22 1347 Place TED hose Start: 10/22/22 1347   Lab Results  Component Value Date   PLT 308 10/23/2022    Inpatient Medications  Scheduled Meds:  escitalopram  20 mg Oral  Daily   levothyroxine  75 mcg Oral QHS   methocarbamol  500 mg Oral TID   pantoprazole  40 mg Oral BID   polyethylene glycol  17 g Oral Daily   senna-docusate  2 tablet Oral QHS   sodium chloride flush  3 mL Intravenous Q12H   sodium chloride flush  3 mL Intravenous Q12H   Continuous Infusions:  sodium chloride     heparin 1,050 Units/hr (10/23/22 1200)   PRN Meds:.sodium chloride, acetaminophen **OR** acetaminophen, bisacodyl, ondansetron **OR** ondansetron (ZOFRAN) IV, oxyCODONE, sodium chloride flush, traZODone   Anti-infectives (From admission, onward)    None      Subjective: Stefanie [name] today has no fevers, no emesis,  No chest pain,    Discussed with granddaughter [name] at [phone] -Left leg pain and swelling improving as per patient -No bleeding concerns at this time   Objective: Vitals:   10/22/22 1700 10/22/22 2250 10/23/22 0233 10/23/22 1426  BP: 128/61 135/80 125/61 122/70  Pulse: 91 93 88 78  Resp: 17 18 18 20   Temp: 98.4 F (36.9 C) 97.8 F (36.6 C) 97.7 F (36.5 C) 98.5 F (36.9 C)  TempSrc: Oral Oral Oral Axillary  SpO2: 97% 98% 94% 97%  Weight:      Height:        Intake/Output Summary (Last 24 hours) at 10/23/2022 1845 Last data filed at 10/23/2022 1429 Gross per 24 hour  Intake 240 ml  Output 1000 ml  Net -760 ml   Filed Weights   10/22/22 1058 10/22/22 1458  Weight: 63 kg 71.7 kg    Physical Exam  Gen:- Awake Alert, in no acute distress HEENT:- [place].AT, No sclera icterus Neck-Supple Neck,No JVD,.  Lungs-  CTAB , fair symmetrical air movement CV- S1, S2 normal, regular  Abd-  +ve B.Sounds, Abd Soft, No tenderness,    Extremity/Skin:- No  edema, pedal pulses present  Psych-affect is appropriate, oriented x3 Neuro-generalized weakness, no new focal deficits, no tremors -MSK--left leg swelling tenderness warmth but no significant redness or cellulitic type signs -Painful range of motion especially around the  ankle  Data Reviewed: I have personally reviewed following labs and imaging studies  CBC: Recent Labs  Lab 10/22/22 1111 10/23/22 0421  WBC 8.7 7.0  NEUTROABS 6.5  --   HGB 8.2* 7.5*  HCT 28.1* 25.9*  MCV 88.9 89.6  PLT 333 308   Basic Metabolic Panel: Recent Labs  Lab 10/22/22 1111 10/23/22 0421  NA 138 138  K 3.6 3.1*  CL 103 103  CO2 27 26  GLUCOSE 120* 135*  BUN 10 9  CREATININE 0.63 0.64  CALCIUM 8.7* 8.5*   GFR: Estimated Creatinine Clearance: 52.6 mL/min (by C-G formula based on SCr of 0.64 mg/dL). Liver Function Tests:   Radiology Studies: US Venous Img Lower  Left (DVT Study)  Result Date: 10/22/2022 CLINICAL DATA:  Left leg pain.  Fall today. EXAM: LEFT LOWER EXTREMITY VENOUS DOPPLER ULTRASOUND TECHNIQUE: Gray-scale sonography with compression, as well as color and duplex ultrasound, were performed  to evaluate the deep venous system(s) from the level of the common femoral vein through the popliteal and proximal calf veins. COMPARISON:  09/29/2022 FINDINGS: VENOUS Occlusive thrombus still seen within the left popliteal, posterior tibial and peroneal veins. Otherwise, normal compressibility of the common femoral and femoral veins. Visualized portions of profunda femoral vein and great saphenous vein unremarkable. No additional filling defects to suggest DVT on grayscale or color Doppler imaging. Doppler waveforms show normal direction of venous flow, normal respiratory plasticity and response to augmentation in the above knee veins. Limited views of the contralateral common femoral vein are unremarkable. OTHER None. Limitations: none IMPRESSION: New acute occlusive deep venous thrombus seen in the left popliteal, posterior tibial, and peroneal veins. Electronically Signed   By: Acquanetta Belling M.D.   On: 10/22/2022 12:24   CT Head Wo Contrast  Result Date: 10/22/2022 CLINICAL DATA:  82 year old female with head and neck pain following fall. Initial encounter. EXAM: CT  HEAD WITHOUT CONTRAST CT CERVICAL SPINE WITHOUT CONTRAST TECHNIQUE: Multidetector CT imaging of the head and cervical spine was performed following the standard protocol without intravenous contrast. Multiplanar CT image reconstructions of the cervical spine were also generated. RADIATION DOSE REDUCTION: This exam was performed according to the departmental dose-optimization program which includes automated exposure control, adjustment of the mA and/or kV according to patient size and/or use of iterative reconstruction technique. COMPARISON:  08/15/2021 CTs and prior studies FINDINGS: CT HEAD FINDINGS Brain: No evidence of acute infarction, hemorrhage, hydrocephalus, extra-axial collection or mass lesion/mass effect. Atrophy and chronic small-vessel white matter ischemic changes again noted. Vascular: Carotid and vertebral atherosclerotic calcifications are noted. Skull: Normal. Negative for fracture or focal lesion. Sinuses/Orbits: No acute finding. Other: None. CT CERVICAL SPINE FINDINGS Alignment: Reversal of the normal cervical lordosis again identified. Minimal anterolisthesis at C3-4, C4-5, C5-6 and at C7-T1 again noted. No new subluxation identified. Skull base and vertebrae: No acute fracture. No primary bone lesion or focal pathologic process. Soft tissues and spinal canal: No prevertebral fluid or swelling. No visible canal hematoma. Disc levels: No acute abnormality. Degenerative changes/ankylosis at C1-2 again noted as well as moderate multilevel facet arthropathy. Mild multilevel degenerative disc disease/spondylosis again identified. Upper chest: No acute abnormality. Other: None IMPRESSION: 1. No evidence of acute intracranial abnormality. Atrophy and chronic small-vessel white matter ischemic changes. 2. No static evidence of acute injury to the cervical spine. Multilevel degenerative changes again noted. Electronically Signed   By: Harmon Pier M.D.   On: 10/22/2022 12:07   CT Cervical Spine Wo  Contrast  Result Date: 10/22/2022 CLINICAL DATA:  82 year old female with head and neck pain following fall. Initial encounter. EXAM: CT HEAD WITHOUT CONTRAST CT CERVICAL SPINE WITHOUT CONTRAST TECHNIQUE: Multidetector CT imaging of the head and cervical spine was performed following the standard protocol without intravenous contrast. Multiplanar CT image reconstructions of the cervical spine were also generated. RADIATION DOSE REDUCTION: This exam was performed according to the departmental dose-optimization program which includes automated exposure control, adjustment of the mA and/or kV according to patient size and/or use of iterative reconstruction technique. COMPARISON:  08/15/2021 CTs and prior studies FINDINGS: CT HEAD FINDINGS Brain: No evidence of acute infarction, hemorrhage, hydrocephalus, extra-axial collection or mass lesion/mass effect. Atrophy and chronic small-vessel white matter ischemic changes again noted. Vascular: Carotid and vertebral atherosclerotic calcifications are noted. Skull: Normal. Negative for fracture or focal lesion. Sinuses/Orbits: No acute finding. Other: None. CT CERVICAL SPINE FINDINGS Alignment: Reversal of the normal cervical lordosis again identified. Minimal  anterolisthesis at C3-4, C4-5, C5-6 and at C7-T1 again noted. No new subluxation identified. Skull base and vertebrae: No acute fracture. No primary bone lesion or focal pathologic process. Soft tissues and spinal canal: No prevertebral fluid or swelling. No visible canal hematoma. Disc levels: No acute abnormality. Degenerative changes/ankylosis at C1-2 again noted as well as moderate multilevel facet arthropathy. Mild multilevel degenerative disc disease/spondylosis again identified. Upper chest: No acute abnormality. Other: None IMPRESSION: 1. No evidence of acute intracranial abnormality. Atrophy and chronic small-vessel white matter ischemic changes. 2. No static evidence of acute injury to the cervical spine.  Multilevel degenerative changes again noted. Electronically Signed   By: Harmon Pier M.D.   On: 10/22/2022 12:07   DG Ankle 2 Views Left  Result Date: 10/22/2022 CLINICAL DATA:  Fall.  Pain. EXAM: LEFT ANKLE - 2 VIEW COMPARISON:  08/27/2022 FINDINGS: AP and cross-table lateral views. No oblique/mortise view performed. Diffuse soft tissue swelling. Osteopenia. Bone anchor within the calcaneus. Heterogeneity about the posterior calcaneus is likely due to remote trauma and/or surgery. Artifact degradation degrades the lateral view. Presumably from clothing or bedding material. IMPRESSION: Limited two view exam with artifact on the lateral view. Diffuse soft tissue swelling with postsurgical and likely remote posttraumatic changes. No gross acute osseous abnormality. If ongoing clinical concern, recommend repeat with standard three-view series. Electronically Signed   By: Jeronimo Greaves M.D.   On: 10/22/2022 12:06   DG Shoulder Right  Result Date: 10/22/2022 CLINICAL DATA:  Fall.  Pain. EXAM: RIGHT SHOULDER - 2+ VIEW COMPARISON:  None Available. FINDINGS: High-riding humeral head, consistent with chronic rotator cuff insufficiency. No acute fracture or dislocation. Visualized portion of the right hemithorax is normal. IMPRESSION: No acute osseous abnormality. Electronically Signed   By: Jeronimo Greaves M.D.   On: 10/22/2022 12:03   DG Chest 1 View  Result Date: 10/22/2022 CLINICAL DATA:  Right-sided fall and pain. EXAM: CHEST  1 VIEW COMPARISON:  07/01/2022 FINDINGS: Oblique portable radiograph with multiple wires and leads overlying the chest. Midline trachea. Mild cardiomegaly. No right and no large left pleural effusion. No pneumothorax. No congestive failure. Mild right hemidiaphragm elevation. IMPRESSION: No acute findings.  Mild limitations as detailed above. Cardiomegaly and low lung volumes. Electronically Signed   By: Jeronimo Greaves M.D.   On: 10/22/2022 12:02     Scheduled Meds:  escitalopram  20 mg  Oral Daily   levothyroxine  75 mcg Oral QHS   methocarbamol  500 mg Oral TID   pantoprazole  40 mg Oral BID   polyethylene glycol  17 g Oral Daily   senna-docusate  2 tablet Oral QHS   sodium chloride flush  3 mL Intravenous Q12H   sodium chloride flush  3 mL Intravenous Q12H   Continuous Infusions:  sodium chloride     heparin 1,050 Units/hr (10/23/22 1200)     LOS: 1 day    Shon Hale M.D on 10/23/2022 at 6:45 PM  Go to [email or web address] - for contact info  [facility] - Office  [phone]  If 7PM-7AM, please contact night-coverage [email or web address] 10/23/2022, 6:45 PM

## 2022-10-24 ENCOUNTER — Ambulatory Visit (HOSPITAL_COMMUNITY)
Admission: RE | Admit: 2022-10-24 | Discharge: 2022-10-24 | Disposition: A | Discharge status: Home / Self Care | Payer: Medicare Other | Source: Ambulatory Visit | Attending: Family Medicine | Admitting: Family Medicine

## 2022-10-24 ENCOUNTER — Inpatient Hospital Stay (HOSPITAL_COMMUNITY): Payer: Medicare Other

## 2022-10-24 DIAGNOSIS — M797 Fibromyalgia: Secondary | ICD-10-CM | POA: Insufficient documentation

## 2022-10-24 DIAGNOSIS — I82492 Acute embolism and thrombosis of other specified deep vein of left lower extremity: Secondary | ICD-10-CM

## 2022-10-24 DIAGNOSIS — D649 Anemia, unspecified: Secondary | ICD-10-CM | POA: Insufficient documentation

## 2022-10-24 DIAGNOSIS — F32A Depression, unspecified: Secondary | ICD-10-CM | POA: Insufficient documentation

## 2022-10-24 DIAGNOSIS — I10 Essential (primary) hypertension: Secondary | ICD-10-CM | POA: Diagnosis not present

## 2022-10-24 DIAGNOSIS — K589 Irritable bowel syndrome without diarrhea: Secondary | ICD-10-CM | POA: Insufficient documentation

## 2022-10-24 DIAGNOSIS — W19XXXA Unspecified fall, initial encounter: Secondary | ICD-10-CM | POA: Diagnosis not present

## 2022-10-24 DIAGNOSIS — E038 Other specified hypothyroidism: Secondary | ICD-10-CM | POA: Diagnosis not present

## 2022-10-24 DIAGNOSIS — I82412 Acute embolism and thrombosis of left femoral vein: Secondary | ICD-10-CM | POA: Insufficient documentation

## 2022-10-24 HISTORY — PX: IR IVC FILTER PLMT / S&I /IMG GUID/MOD SED: IMG701

## 2022-10-24 LAB — HEMOGLOBIN AND HEMATOCRIT, BLOOD
HCT: 22.4 % — ABNORMAL LOW (ref 36.0–46.0)
HCT: 28.5 % — ABNORMAL LOW (ref 36.0–46.0)
Hemoglobin: 6.5 g/dL — CL (ref 12.0–15.0)
Hemoglobin: 8.4 g/dL — ABNORMAL LOW (ref 12.0–15.0)

## 2022-10-24 LAB — CBC
HCT: 23.1 % — ABNORMAL LOW (ref 36.0–46.0)
Hemoglobin: 6.7 g/dL — CL (ref 12.0–15.0)
MCH: 25.6 pg — ABNORMAL LOW (ref 26.0–34.0)
MCHC: 29 g/dL — ABNORMAL LOW (ref 30.0–36.0)
MCV: 88.2 fL (ref 80.0–100.0)
Platelets: 285 10*3/uL (ref 150–400)
RBC: 2.62 MIL/uL — ABNORMAL LOW (ref 3.87–5.11)
RDW: 15.9 % — ABNORMAL HIGH (ref 11.5–15.5)
WBC: 6.9 10*3/uL (ref 4.0–10.5)
nRBC: 0 % (ref 0.0–0.2)

## 2022-10-24 LAB — PREPARE RBC (CROSSMATCH)

## 2022-10-24 LAB — BPAM RBC: Unit Type and Rh: 6200

## 2022-10-24 LAB — TYPE AND SCREEN

## 2022-10-24 LAB — ABO/RH: ABO/RH(D): A POS

## 2022-10-24 MED ORDER — IOHEXOL 300 MG/ML  SOLN
150.0000 mL | Freq: Once | INTRAMUSCULAR | Status: AC | PRN
  Administered 2022-10-24: 40 mL via INTRAVENOUS

## 2022-10-24 MED ORDER — TETRACAINE HCL 1 % IJ SOLN
100.0000 mg | Freq: Once | INTRAMUSCULAR | Status: DC
  Filled 2022-10-24: qty 10

## 2022-10-24 MED ORDER — HEPARIN (PORCINE) 25000 UT/250ML-% IV SOLN
1200.0000 [IU]/h | INTRAVENOUS | Status: DC
  Administered 2022-10-24: 1050 [IU]/h via INTRAVENOUS
  Administered 2022-10-25: 1200 [IU]/h via INTRAVENOUS
  Filled 2022-10-24 (×2): qty 250

## 2022-10-24 MED ORDER — SODIUM CHLORIDE 0.9% IV SOLUTION: Freq: Once | INTRAVENOUS | Status: AC

## 2022-10-24 MED ORDER — LIDOCAINE HCL 1 % IJ SOLN
INTRAMUSCULAR | Status: AC
  Filled 2022-10-24: qty 20

## 2022-10-24 MED ORDER — SUCRALFATE 1 GM/10ML PO SUSP
1.0000 g | Freq: Three times a day (TID) | ORAL | Status: DC
  Administered 2022-10-24 – 2022-10-27 (×11): 1 g via ORAL
  Filled 2022-10-24 (×11): qty 10

## 2022-10-24 MED ORDER — MIDAZOLAM HCL 2 MG/2ML IJ SOLN
INTRAMUSCULAR | Status: AC
  Filled 2022-10-24: qty 2

## 2022-10-24 MED ORDER — MIDAZOLAM HCL 2 MG/2ML IJ SOLN
INTRAMUSCULAR | Status: AC | PRN
  Administered 2022-10-24: 1 mg via INTRAVENOUS
  Administered 2022-10-24: .5 mg via INTRAVENOUS

## 2022-10-24 MED ORDER — FENTANYL CITRATE (PF) 100 MCG/2ML IJ SOLN
INTRAMUSCULAR | Status: AC
  Filled 2022-10-24: qty 2

## 2022-10-24 MED ORDER — FENTANYL CITRATE (PF) 100 MCG/2ML IJ SOLN
INTRAMUSCULAR | Status: AC | PRN
  Administered 2022-10-24: 25 ug via INTRAVENOUS

## 2022-10-24 MED ORDER — PANTOPRAZOLE SODIUM 40 MG IV SOLR
40.0000 mg | Freq: Two times a day (BID) | INTRAVENOUS | Status: DC
  Administered 2022-10-24 – 2022-10-25 (×3): 40 mg via INTRAVENOUS
  Filled 2022-10-24 (×4): qty 10

## 2022-10-24 NOTE — Plan of Care (Signed)

## 2022-10-24 NOTE — Progress Notes (Signed)
PT Cancellation Note  Patient Details Name: [name] MRN: [id number] DOB: 05-30-1941   Cancelled Treatment:    Reason Eval/Treat Not Completed: Patient at procedure or test/unavailable. Patient out for a procedure at [facility], will check back tomorrow.   2:59 PM, 10/24/22 Ocie Bob, MPT Physical Therapist with [facility] [phone] office [phone] mobile phone

## 2022-10-24 NOTE — Progress Notes (Signed)
Date and time results received: 10/24/22 0744 Test: hemoglobin Critical Value: 6.5 Name of Provider Notified: Dr. Mariea Clonts and Dr. Elby Showers Orders Received? Or Actions Taken?: transfuse prior to [facility] for [facility]C filter Manya Silvas, RN

## 2022-10-24 NOTE — Progress Notes (Signed)
ANTICOAGULATION CONSULT NOTE - Follow Up Consult  Pharmacy Consult for Heparin Indication: New DVT  Allergies  Allergen Reactions   Gabapentin      Dizziness. States made her feel like she was drunk. Unsteady gait and dizziness.         Lidocaine Rash   Metoclopramide Rash   Other Other (See Comments)    DYE+Nitrofurantoin+Brilliant Blue Fcf   Sulfa Antibiotics Rash   Z-Pak [Azithromycin] Rash   Metoclopramide Hcl Other (See Comments)    REACTION: a drawing of all her muscles   Penicillins Other (See Comments)    Unknown    Procaine Hcl Other (See Comments)    Unknown    Macrodantin [Nitrofurantoin Macrocrystal] Rash    Patient Measurements: Height: 5\' 4"  (162.6 cm) Weight: 71.7 kg (158 lb 1.1 oz) IBW/kg (Calculated) : 54.7 Heparin Dosing Weight: 63 kg  Vital Signs: Temp: 98.6 F (37 C) (05/20 1124) Temp Source: Oral (05/20 1124) BP: 117/54 (05/20 1342) Pulse Rate: 74 (05/20 1342)  Labs: Recent Labs    10/22/22 1111 10/22/22 2247 10/23/22 0421 10/23/22 0801 10/24/22 0450 10/24/22 0715 10/24/22 1550  HGB 8.2*  --  7.5*  --  6.7* 6.5* 8.4*  HCT 28.1*  --  25.9*  --  23.1* 22.4* 28.5*  PLT 333  --  308  --  285  --   --   HEPARINUNFRC  --  0.31  --  0.39  --   --   --   CREATININE 0.63  --  0.64  --   --   --   --      Estimated Creatinine Clearance: 52.6 mL/min (by C-G formula based on SCr of 0.64 mg/dL).  Assessment: 82 year old female with new acute occlusive deep venous thrombus seen in the left popliteal, posterior tibial, and peroneal veins. Hx of prior DVT previously treated with Eliquis (no longer taking). Pharmacy consulted to initiate heparin therapy.  Heparin stopped this Am for placement of IVC filter this AM. Per Dr. Mariea Clonts to restart at 1800.   Goal of Therapy:  Heparin level 0.3-0.7 units/ml Monitor platelets by anticoagulation protocol: Yes   Plan:  Restart Heparin drip @ 1050 units/hr Heparin level in 8 hours Daily HL and  CBC Monitor for any bleeding adverse events  Elder Cyphers, BS Pharm D, BCPS Clinical Pharmacist 10/24/2022 5:24 PM

## 2022-10-24 NOTE — Procedures (Signed)
Interventional Radiology Procedure Note  Procedure: IVC filter placement  Findings: Please refer to procedural dictation for full description. Infrarenal [place] IVC filter placed, right IJ access.  Complications: None immediate  Estimated Blood Loss: < 5 mL  Recommendations: IR will arrange for outpatient follow up.   Marliss Coots, MD

## 2022-10-24 NOTE — Progress Notes (Signed)
PROGRESS NOTE     Stefanie [name], is a 82 y.o. female, DOB - 11-Nov-1940, WUJ:[id number]  Admit date - 10/22/2022   Admitting Physician Mart Colpitts Mariea Clonts, MD  Outpatient Primary MD for the patient is [name], MD  LOS - 2  Chief Complaint  Patient presents with   Fall        Brief Narrative:  82 y.o. 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 -Patient's brother also has recurrent DVTs -Left lower extremity venous Dopplers with new acute deep vein thrombosis in the left popliteal, posterior tibial and left peroneal veins these are new compared to venous Dopplers from 09/29/2022 -Recent venous Dopplers of the left lower extremity from 09/29/2022, as well as the study from 08/24/2022, 07/05/2022 and  07/01/2022 were all reviewed with on-call radiologist -- Doppler finding this admission shows acute new acute extremity DVT that appears to be extensive and occlusive-- --concerns about recent heme positive stool with incomplete GI workup and recurrent falls -Risk versus benefits of anticoagulation discussed with patient and patient's granddaughter [name] at [phone]----- 10/24/22 - IR consult appreciated as per Dr. Marliss Coots she is a candidate for IVC -On 10/24/2022 and underwent IVC placement procedure at [facility] --c/n  IV heparin --Hemoglobin dropped to 6.5 transfused 1 unit on 10/24/2022 , Hgb currently up to 8.4 -Hold off on transitioning to a DOAC due to concerns about further bleeding and drop in H&H -Given recurrent DVT and strong family history of DVT she will need full  anticoagulation indefinitely, however if she gets IVC filter she will need shorter duration of anticoagulation  - 2)GERD--recent heme positive stool -Incomplete EGD on 09/30/2025 due to poor prep -Patient declines further endoluminal evaluation -Continue Protonix -Watch for bleeding while on anticoagulation   3)Acute on Chronic Anemia-----Hemoglobin dropped to 6.5 transfused 1 unit on 10/24/2022 , Hgb currently up to 8.4 - monitor closely while on full anticoagulation   4)Depression Continue Lexapro 20 mg qhs -May use trazodone nightly as needed   5)Hypothyroidism Continue synthroid 75 mcg qhs   6) status post mechanical fall/ambulatory dysfunction----keep patient on telemetry monitored unit to watch for possible arrhythmias  -CT head and CT C-spine without acute finding -Chest x-ray, left ankle and right shoulder x-rays without acute findings -Prior to admission patient lived alone and ambulated with a walker she had a boot on the left leg due to recent Achilles tendon injury -Very high risk for falls which is very concerning while on full anticoagulation -PT eval requested   7)Social/Ethics----plan of care and advanced directive discussed with patient and  patient's granddaughter [name] at [phone] -- Patient is a full code -She would like her granddaughter [name] to be decision-maker if she is no longer able to make any decisions for herself  Status is: Inpatient   Disposition: The patient is from: Home              Anticipated d/c is to: SNF              Anticipated d/c date is: 1 day  Patient currently is not medically stable to d/c. Barriers: Not Clinically Stable-   Code Status :  -  Code Status: Full Code   Family Communication:      (patient is alert, awake and coherent), Discussed with granddaughter [name] at [phone]  DVT Prophylaxis  :   - SCDs   Place TED hose Start: 10/22/22 1347  Lab Results  Component Value Date    PLT 285 10/24/2022   Inpatient Medications  Scheduled Meds:  escitalopram  20 mg Oral Daily   levothyroxine  75 mcg Oral QHS   methocarbamol  500 mg Oral TID   pantoprazole (PROTONIX) IV  40 mg Intravenous Q12H   polyethylene glycol  17 g Oral Daily   senna-docusate  2 tablet Oral QHS   sodium chloride flush  3 mL Intravenous Q12H   sodium chloride flush  3 mL Intravenous Q12H   sucralfate  1 g Oral TID WC & HS   Continuous Infusions:  sodium chloride     heparin     PRN Meds:.sodium chloride, acetaminophen **OR** acetaminophen, bisacodyl, ondansetron **OR** ondansetron (ZOFRAN) IV, oxyCODONE, sodium chloride flush, traZODone   Anti-infectives (From admission, onward)    None      Subjective: Stefanie [name] today has no fevers, no emesis,  No chest pain,    -Tolerated IVC filter placement well --Hemoglobin dropped to 6.5 transfused 1 unit on 10/24/2022 , Hgb currently up to 8.4 -Tolerated transfusion of 1 unit of PRBC well  Objective: Vitals:   10/24/22 0424 10/24/22 0915 10/24/22 0950 10/24/22 1124  BP: (!) 116/57 (!) 106/46 (!) 103/47 (!) 120/54  Pulse: 77 71 73 73  Resp: 16 16 16 14   Temp: 98.3 F (36.8 C) 98.3 F (36.8 C) 98.8 F (37.1 C) 98.6 F (37 C)  TempSrc:  Oral Oral Oral  SpO2: 92% 95% 95% 95%  Weight:      Height:        Intake/Output Summary (Last 24 hours) at 10/24/2022 1743 Last data filed at 10/24/2022 1600 Gross per 24 hour  Intake 583.33 ml  Output 900 ml  Net -316.67 ml   Filed Weights   10/22/22 1058 10/22/22 1458  Weight: 63 kg 71.7 kg   Physical Exam  Gen:- Awake Alert, in no acute distress HEENT:- [place].AT, No sclera icterus Neck-Supple Neck,No JVD,.  Lungs-  CTAB , fair symmetrical air movement CV- S1, S2 normal, regular  Abd-  +ve B.Sounds, Abd Soft, No tenderness,    Extremity/Skin:- No  edema, pedal pulses present  Psych-affect is appropriate, oriented x3 Neuro-generalized weakness, no new focal deficits, no  tremors -MSK--improving left leg swelling,  tenderness and  warmth    Data Reviewed: I have personally reviewed following labs and imaging studies  CBC: Recent Labs  Lab 10/22/22 1111 10/23/22 0421 10/24/22 0450 10/24/22 0715 10/24/22 1550  WBC 8.7 7.0 6.9  --   --   NEUTROABS 6.5  --   --   --   --   HGB 8.2* 7.5* 6.7* 6.5* 8.4*  HCT 28.1* 25.9* 23.1* 22.4* 28.5*  MCV 88.9 89.6 88.2  --   --   PLT 333 308 285  --   --    Basic Metabolic Panel: Recent Labs  Lab 10/22/22 1111 10/23/22 0421  NA 138 138  K 3.6 3.1*  CL 103 103  CO2 27 26  GLUCOSE 120* 135*  BUN 10 9  CREATININE 0.63 0.64  CALCIUM 8.7* 8.5*   GFR:  Estimated Creatinine Clearance: 52.6 mL/min (by C-G formula based on SCr of 0.64 mg/dL). Liver Function Tests:  Radiology Studies: IR IVC FILTER PLMT / S&I Lenise Arena GUID/MOD SED  Result Date: 10/24/2022 CLINICAL DATA:  82 year old female with history of deep vein thrombosis and intolerance to anticoagulation due to gastrointestinal hemorrhage. EXAM: 1. ULTRASOUND GUIDANCE FOR VASCULAR ACCESS OF THE RIGHT internal jugular VEIN. 2. IVC VENOGRAM. 3. PERCUTANEOUS IVC FILTER PLACEMENT. ANESTHESIA/SEDATION: Moderate (conscious) sedation was employed during this procedure. A total of Versed 1.5 mg and Fentanyl 25 mcg was administered intravenously. Moderate Sedation Time: 16 minutes. The patient's level of consciousness and vital signs were monitored continuously by radiology nursing throughout the procedure under my direct supervision. CONTRAST:  40mL OMNIPAQUE IOHEXOL 300 MG/ML  SOLN FLUOROSCOPY TIME:  Sixty-two mGy PROCEDURE: The procedure, risks, benefits, and alternatives were explained to the patient. Questions regarding the procedure were encouraged and answered. The patient understands and consents to the procedure. The patient was prepped with Betadine in a sterile fashion, and a sterile drape was applied covering the operative field. A sterile gown and sterile gloves  were used for the procedure. Local anesthesia was provided with 1% Lidocaine. Under direct ultrasound guidance, a 21 gauge needle was advanced into the right internal jugular vein with ultrasound image documentation performed. After securing access with a micropuncture dilator, a guidewire was advanced into the inferior vena cava. A deployment sheath was advanced over the guidewire. This was utilized to perform IVC venography. The deployment sheath was further positioned in an appropriate location for filter deployment. A Denali IVC filter was then advanced in the sheath. This was then fully deployed in the infrarenal IVC. Final filter position was confirmed with a fluoroscopic spot image. Contrast injection was also performed through the sheath under fluoroscopy to confirm patency of the IVC at the level of the filter. After the procedure the sheath was removed and hemostasis obtained with manual compression. COMPLICATIONS: None. FINDINGS: IVC venography demonstrates a normal caliber IVC with no evidence of thrombus. Renal veins are identified bilaterally. The IVC filter was successfully positioned below the level of the renal veins and is appropriately oriented. This IVC filter has both permanent and retrievable indications. IMPRESSION: Placement of percutaneous IVC filter in infrarenal IVC. IVC venogram shows no evidence of IVC thrombus and normal caliber of the inferior vena cava. This filter does have both permanent and retrievable indications. PLAN: This IVC filter is potentially retrievable. The patient will be assessed for filter retrieval by Interventional Radiology in approximately 8-12 weeks. Further recommendations regarding filter retrieval, continued surveillance or declaration of device permanence, will be made at that time. Marliss Coots, MD [facility] [address] Radiology Electronically Signed   By: Marliss Coots M.D.   On: 10/24/2022 14:26    Scheduled  Meds:  escitalopram  20 mg Oral Daily   levothyroxine  75 mcg Oral QHS   methocarbamol  500 mg Oral TID   pantoprazole (PROTONIX) IV  40 mg Intravenous Q12H   polyethylene glycol  17 g Oral Daily   senna-docusate  2 tablet Oral QHS   sodium chloride flush  3 mL Intravenous Q12H   sodium chloride flush  3 mL Intravenous Q12H   sucralfate  1 g Oral TID WC & HS   Continuous Infusions:  sodium chloride     heparin      LOS: 2 days   Shon Hale M.D on 10/24/2022 at 5:43 PM  Go to [email or web address] - for contact info  [facility] - Office  [phone]  If 7PM-7AM, please contact night-coverage [email or web address] 10/24/2022, 5:43 PM

## 2022-10-24 NOTE — Sedation Documentation (Signed)
Pt transferred back to [facility] by [facility]. Awake and alert. In no distress. 

## 2022-10-24 NOTE — Progress Notes (Addendum)
   10/24/22 1610  Provider Notification  Provider Name/Title Dr Thomes Dinning  Date Provider Notified 10/24/22  Time Provider Notified [phone]  Method of Notification Page  Notification Reason Critical Result  Test performed and critical result HBG 6.7  Date Critical Result Received 10/24/22  Time Critical Result Received 0619   Message also sent to Dr. Elby Showers with [facility] concerning hbg results.

## 2022-10-24 NOTE — Consult Note (Signed)
Chief Complaint: Patient was seen in consultation today for  Chief Complaint  Patient presents with   Fall   Referring Physician(s): Dr. Mariea Clonts  Supervising Physician: Marliss Coots  Patient Status: Stefanie [name] - In patient   History of Present Illness: Stefanie [name] is an 82 y.o. 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 She was recently hospitalized 4/25-4/27 for leg swelling and was found to have a non-occluding DVT in her left femoral vein. Her fecal occult test was positive. She was treated with IV heparin and protonix. 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 Per Vascular recommendations, patient was ok to be discharged without IVC filter and without anticoagulation.    She presented to the [facility] ED 10/22/22 after a mechanical fall at home. Her hemoglobin level was stable compared to her hospital visit in April. 10/22/22 venous duplex was positive for "new acute occlusive deep vein thrombus in the left popliteal, posterior tibial and peroneal veins." She was started on IV heparin.    Interventional Radiology has been asked to evaluate this patient for an image-guided inferior vena cava filter placement. Imaging reviewed and procedure approved by Dr. Elby Showers.   Past Medical History:  Diagnosis Date   Anxiety    Arthritis    Colon polyps    Depression    Diverticulosis of colon (without mention of hemorrhage)    Fibromyalgia    GERD (gastroesophageal reflux disease)    Hiatal hernia    History of IBS    History of kidney stones    Hypothyroid    Hypothyroidism     Past Surgical History:  Procedure Laterality Date   ABDOMINAL HYSTERECTOMY     ANKLE SURGERY     bilateral    APPENDECTOMY  1985   BREAST BIOPSY Left    Benign     CHOLECYSTECTOMY  1985   ESOPHAGEAL MANOMETRY N/A 10/22/2012   Procedure: ESOPHAGEAL MANOMETRY (EM);  Surgeon: Mardella Layman, MD;  Location: [facility] ENDOSCOPY;  Service: Endoscopy;  Laterality: N/A;   ESOPHAGOGASTRODUODENOSCOPY N/A 10/01/2022   Procedure: ESOPHAGOGASTRODUODENOSCOPY (EGD);  Surgeon: [name], MD;  Location: [name] ENDOSCOPY;  Service: Gastroenterology;  Laterality: N/A;   EXTRACORPOREAL SHOCK WAVE LITHOTRIPSY Left 11/26/2018   Procedure: EXTRACORPOREAL SHOCK WAVE LITHOTRIPSY (ESWL);  Surgeon: Marcine Matar, MD;  Location: [facility] ORS;  Service: Urology;  Laterality: Left;   HEMORRHOID SURGERY     RECTOCELE REPAIR     TUBAL LIGATION      Allergies: Gabapentin, Lidocaine, Metoclopramide, Other, Sulfa antibiotics, Z-pak [azithromycin], Metoclopramide hcl, Penicillins, Procaine hcl, and Macrodantin [nitrofurantoin macrocrystal]  Medications: Prior to Admission medications   Medication Sig Start Date End Date Taking? Authorizing Provider  Vitamin D, Ergocalciferol, (DRISDOL) 1.25 MG (50000 UNIT) CAPS capsule TAKE 1 CAPSULE ONCE A WEEK 10/05/22  Yes [provider]  acetaminophen (TYLENOL) 500 MG tablet Take 2 tablets (1,000 mg total) by mouth every 6 (six) hours as needed for moderate pain. 10/01/22   Lewie Chamber, MD  escitalopram (LEXAPRO) 20 MG tablet Take 20 mg by mouth daily.    [provider]  levothyroxine (SYNTHROID, LEVOTHROID) 75 MCG tablet Take 75 mcg by mouth at bedtime.    [provider]  pantoprazole (PROTONIX) 40 MG tablet Take 1 tablet (40 mg total) by mouth 2 (two) times daily.  10/01/22 10/01/23  Lewie Chamber, MD  potassium chloride (KLOR-CON) 10 MEQ tablet Take 10 mEq by mouth daily. 08/17/22   [provider]     Family History  Problem Relation Age of Onset   Colon polyps Brother    Breast cancer Neg Hx     Social History   Socioeconomic History   Marital status: Widowed    Spouse name: Not on file   Number of  children: 1   Years of education: Not on file   Highest education level: Not on file  Occupational History   Occupation: retired    Associate Professor: RETIRED  Tobacco Use   Smoking status: Never   Smokeless tobacco: Never  Vaping Use   Vaping Use: Never used  Substance and Sexual Activity   Alcohol use: No    Alcohol/week: 0.0 standard drinks of alcohol   Drug use: No   Sexual activity: Not on file  Other Topics Concern   Not on file  Social History Narrative   Not on file   Social Determinants of Health   Financial Resource Strain: Not on file  Food Insecurity: Food Insecurity Present (10/22/2022)   Hunger Vital Sign    Worried About Running Out of Food in the Last Year: Sometimes true    Ran Out of Food in the Last Year: Sometimes true  Transportation Needs: No Transportation Needs (10/22/2022)   PRAPARE - Administrator, Civil Service (Medical): No    Lack of Transportation (Non-Medical): No  Physical Activity: Not on file  Stress: Not on file  Social Connections: Not on file    Review of Systems: A 12 point ROS discussed and pertinent positives are indicated in the HPI above.  All other systems are negative.  Review of Systems  Constitutional:  Positive for fatigue. Negative for appetite change.  Respiratory:  Negative for cough and shortness of breath.   Cardiovascular:  Positive for leg swelling. Negative for chest pain.  Gastrointestinal:  Negative for abdominal pain, diarrhea, nausea and vomiting.  Neurological:  Negative for dizziness and headaches.    Vital Signs: BP (!) 116/57 (BP Location: Left Arm)   Pulse 77   Temp 98.3 F (36.8 C)   Resp 16   Ht 5\' 4"  (1.626 m)   Wt 158 lb 1.1 oz (71.7 kg)   SpO2 92%   BMI 27.13 kg/m   Physical Exam Constitutional:      General: She is not in acute distress.    Appearance: She is not ill-appearing.  HENT:     Mouth/Throat:     Mouth: Mucous membranes are moist.     Pharynx: Oropharynx is clear.   Cardiovascular:     Rate and Rhythm: Normal rate and regular rhythm.     Pulses: Normal pulses.  Pulmonary:     Effort: Pulmonary effort is normal.  Abdominal:     Palpations: Abdomen is soft.     Tenderness: There is no abdominal tenderness.  Musculoskeletal:     Right lower leg: No edema.     Left lower leg: Edema present.  Skin:    General: Skin is warm and dry.     Comments: Scattered abrasions and scabs to the right lower leg from wearing walking boot.   Neurological:     Mental Status: She is alert and oriented to person, place, and time.  Psychiatric:        Mood and Affect: Mood normal.  Behavior: Behavior normal.        Thought Content: Thought content normal.        Judgment: Judgment normal.     Imaging: US Venous Img Lower  Left (DVT Study)  Result Date: 10/22/2022 CLINICAL DATA:  Left leg pain.  Fall today. EXAM: LEFT LOWER EXTREMITY VENOUS DOPPLER ULTRASOUND TECHNIQUE: Gray-scale sonography with compression, as well as color and duplex ultrasound, were performed to evaluate the deep venous system(s) from the level of the common femoral vein through the popliteal and proximal calf veins. COMPARISON:  09/29/2022 FINDINGS: VENOUS Occlusive thrombus still seen within the left popliteal, posterior tibial and peroneal veins. Otherwise, normal compressibility of the common femoral and femoral veins. Visualized portions of profunda femoral vein and great saphenous vein unremarkable. No additional filling defects to suggest DVT on grayscale or color Doppler imaging. Doppler waveforms show normal direction of venous flow, normal respiratory plasticity and response to augmentation in the above knee veins. Limited views of the contralateral common femoral vein are unremarkable. OTHER None. Limitations: none IMPRESSION: New acute occlusive deep venous thrombus seen in the left popliteal, posterior tibial, and peroneal veins. Electronically Signed   By: Acquanetta Belling M.D.   On:  10/22/2022 12:24   CT Head Wo Contrast  Result Date: 10/22/2022 CLINICAL DATA:  82 year old female with head and neck pain following fall. Initial encounter. EXAM: CT HEAD WITHOUT CONTRAST CT CERVICAL SPINE WITHOUT CONTRAST TECHNIQUE: Multidetector CT imaging of the head and cervical spine was performed following the standard protocol without intravenous contrast. Multiplanar CT image reconstructions of the cervical spine were also generated. RADIATION DOSE REDUCTION: This exam was performed according to the departmental dose-optimization program which includes automated exposure control, adjustment of the mA and/or kV according to patient size and/or use of iterative reconstruction technique. COMPARISON:  08/15/2021 CTs and prior studies FINDINGS: CT HEAD FINDINGS Brain: No evidence of acute infarction, hemorrhage, hydrocephalus, extra-axial collection or mass lesion/mass effect. Atrophy and chronic small-vessel white matter ischemic changes again noted. Vascular: Carotid and vertebral atherosclerotic calcifications are noted. Skull: Normal. Negative for fracture or focal lesion. Sinuses/Orbits: No acute finding. Other: None. CT CERVICAL SPINE FINDINGS Alignment: Reversal of the normal cervical lordosis again identified. Minimal anterolisthesis at C3-4, C4-5, C5-6 and at C7-T1 again noted. No new subluxation identified. Skull base and vertebrae: No acute fracture. No primary bone lesion or focal pathologic process. Soft tissues and spinal canal: No prevertebral fluid or swelling. No visible canal hematoma. Disc levels: No acute abnormality. Degenerative changes/ankylosis at C1-2 again noted as well as moderate multilevel facet arthropathy. Mild multilevel degenerative disc disease/spondylosis again identified. Upper chest: No acute abnormality. Other: None IMPRESSION: 1. No evidence of acute intracranial abnormality. Atrophy and chronic small-vessel white matter ischemic changes. 2. No static evidence of acute  injury to the cervical spine. Multilevel degenerative changes again noted. Electronically Signed   By: Harmon Pier M.D.   On: 10/22/2022 12:07   CT Cervical Spine Wo Contrast  Result Date: 10/22/2022 CLINICAL DATA:  82 year old female with head and neck pain following fall. Initial encounter. EXAM: CT HEAD WITHOUT CONTRAST CT CERVICAL SPINE WITHOUT CONTRAST TECHNIQUE: Multidetector CT imaging of the head and cervical spine was performed following the standard protocol without intravenous contrast. Multiplanar CT image reconstructions of the cervical spine were also generated. RADIATION DOSE REDUCTION: This exam was performed according to the departmental dose-optimization program which includes automated exposure control, adjustment of the mA and/or kV according to patient size and/or use of iterative reconstruction  technique. COMPARISON:  08/15/2021 CTs and prior studies FINDINGS: CT HEAD FINDINGS Brain: No evidence of acute infarction, hemorrhage, hydrocephalus, extra-axial collection or mass lesion/mass effect. Atrophy and chronic small-vessel white matter ischemic changes again noted. Vascular: Carotid and vertebral atherosclerotic calcifications are noted. Skull: Normal. Negative for fracture or focal lesion. Sinuses/Orbits: No acute finding. Other: None. CT CERVICAL SPINE FINDINGS Alignment: Reversal of the normal cervical lordosis again identified. Minimal anterolisthesis at C3-4, C4-5, C5-6 and at C7-T1 again noted. No new subluxation identified. Skull base and vertebrae: No acute fracture. No primary bone lesion or focal pathologic process. Soft tissues and spinal canal: No prevertebral fluid or swelling. No visible canal hematoma. Disc levels: No acute abnormality. Degenerative changes/ankylosis at C1-2 again noted as well as moderate multilevel facet arthropathy. Mild multilevel degenerative disc disease/spondylosis again identified. Upper chest: No acute abnormality. Other: None IMPRESSION: 1. No  evidence of acute intracranial abnormality. Atrophy and chronic small-vessel white matter ischemic changes. 2. No static evidence of acute injury to the cervical spine. Multilevel degenerative changes again noted. Electronically Signed   By: Harmon Pier M.D.   On: 10/22/2022 12:07   DG Ankle 2 Views Left  Result Date: 10/22/2022 CLINICAL DATA:  Fall.  Pain. EXAM: LEFT ANKLE - 2 VIEW COMPARISON:  08/27/2022 FINDINGS: AP and cross-table lateral views. No oblique/mortise view performed. Diffuse soft tissue swelling. Osteopenia. Bone anchor within the calcaneus. Heterogeneity about the posterior calcaneus is likely due to remote trauma and/or surgery. Artifact degradation degrades the lateral view. Presumably from clothing or bedding material. IMPRESSION: Limited two view exam with artifact on the lateral view. Diffuse soft tissue swelling with postsurgical and likely remote posttraumatic changes. No gross acute osseous abnormality. If ongoing clinical concern, recommend repeat with standard three-view series. Electronically Signed   By: Jeronimo Greaves M.D.   On: 10/22/2022 12:06   DG Shoulder Right  Result Date: 10/22/2022 CLINICAL DATA:  Fall.  Pain. EXAM: RIGHT SHOULDER - 2+ VIEW COMPARISON:  None Available. FINDINGS: High-riding humeral head, consistent with chronic rotator cuff insufficiency. No acute fracture or dislocation. Visualized portion of the right hemithorax is normal. IMPRESSION: No acute osseous abnormality. Electronically Signed   By: Jeronimo Greaves M.D.   On: 10/22/2022 12:03   DG Chest 1 View  Result Date: 10/22/2022 CLINICAL DATA:  Right-sided fall and pain. EXAM: CHEST  1 VIEW COMPARISON:  07/01/2022 FINDINGS: Oblique portable radiograph with multiple wires and leads overlying the chest. Midline trachea. Mild cardiomegaly. No right and no large left pleural effusion. No pneumothorax. No congestive failure. Mild right hemidiaphragm elevation. IMPRESSION: No acute findings.  Mild limitations  as detailed above. Cardiomegaly and low lung volumes. Electronically Signed   By: Jeronimo Greaves M.D.   On: 10/22/2022 12:02   VAS [place] LOWER EXTREMITY VENOUS (DVT)  Result Date: 10/01/2022  Lower Venous DVT Study Patient Name:  Stefanie [name]  Date of Exam:   09/30/2022 Medical Rec #: [id number]     Accession #:    [id number] Date of Birth: [birth year]     Patient Gender: F Patient Age:   70 years Exam Location:  [name] Procedure:      VAS [place] LOWER EXTREMITY VENOUS (DVT) Referring Phys: Ivin Booty ROBINS --------------------------------------------------------------------------------  Indications: Edema, and History of DVT in left femoral vein on 09/29/22. Repeat study for possible IVC filter placement.  Performing Technologist: Marilynne Halsted RDMS, RVT  Examination Guidelines: A complete evaluation includes B-mode imaging, spectral Doppler, color Doppler, and power Doppler as needed of  all accessible portions of each vessel. Bilateral testing is considered an integral part of a complete examination. Limited examinations for reoccurring indications may be performed as noted. The reflux portion of the exam is performed with the patient in reverse Trendelenburg.  +-----+---------------+---------+-----------+----------+--------------+ RIGHTCompressibilityPhasicitySpontaneityPropertiesThrombus Aging +-----+---------------+---------+-----------+----------+--------------+ CFV  Full           Yes      Yes                                 +-----+---------------+---------+-----------+----------+--------------+ SFJ  Full                                                        +-----+---------------+---------+-----------+----------+--------------+   +---------+---------------+---------+-----------+----------+-------------------+ LEFT     CompressibilityPhasicitySpontaneityPropertiesThrombus Aging      +---------+---------------+---------+-----------+----------+-------------------+ CFV      Full            Yes      Yes                                      +---------+---------------+---------+-----------+----------+-------------------+ SFJ      Full                                                             +---------+---------------+---------+-----------+----------+-------------------+ FV Prox  Full                                                             +---------+---------------+---------+-----------+----------+-------------------+ FV Mid   Full                                                             +---------+---------------+---------+-----------+----------+-------------------+ FV DistalFull           Yes      Yes                                      +---------+---------------+---------+-----------+----------+-------------------+ PFV      Full                                                             +---------+---------------+---------+-----------+----------+-------------------+ POP      Full           Yes      Yes                                      +---------+---------------+---------+-----------+----------+-------------------+  PTV      Full                                                             +---------+---------------+---------+-----------+----------+-------------------+ PERO                                                  one of the paired                                                         vein not clearly                                                          visualized          +---------+---------------+---------+-----------+----------+-------------------+ GSV      Partial                                      one of the paired                                                         veins               +---------+---------------+---------+-----------+----------+-------------------+ SSV      None                                         Chronic              +---------+---------------+---------+-----------+----------+-------------------+ LEIV                                                  Patent              +---------+---------------+---------+-----------+----------+-------------------+ LCIV                                                  Patent              +---------+---------------+---------+-----------+----------+-------------------+ Left femoral vein appears patent without evidence of thrombus.    Summary: RIGHT: - No evidence of common femoral vein obstruction.  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 - Findings  consistent with chronic superficial vein thrombosis involving the left small saphenous vein.  *See table(s) above for measurements and observations. Electronically signed by Heath Lark on 10/01/2022 at 1:45:42 PM.    Final    US Venous Img Lower Bilateral (DVT)  Result Date: 09/29/2022 CLINICAL DATA:  Bilateral lower extremity edema. EXAM: BILATERAL LOWER EXTREMITY VENOUS DOPPLER ULTRASOUND TECHNIQUE: Gray-scale sonography with graded compression, as well as color Doppler and duplex ultrasound were performed to evaluate the lower extremity deep venous systems from the level of the common femoral vein and including the common femoral, femoral, profunda femoral, popliteal and calf veins including the posterior tibial, peroneal and gastrocnemius veins when visible. The superficial great saphenous vein was also interrogated. Spectral Doppler was utilized to evaluate flow at rest and with distal augmentation maneuvers in the common femoral, femoral and popliteal veins. COMPARISON:  August 24, 2022 FINDINGS: RIGHT LOWER EXTREMITY Common Femoral Vein: No evidence of thrombus. Normal compressibility, respiratory phasicity and response to augmentation. Saphenofemoral Junction: No evidence of thrombus. Normal compressibility and flow on color Doppler imaging. Profunda Femoral Vein:  No evidence of thrombus. Normal compressibility and flow on color Doppler imaging. Femoral Vein: No evidence of thrombus. Normal compressibility, respiratory phasicity and response to augmentation. Popliteal Vein: No evidence of thrombus. Normal compressibility, respiratory phasicity and response to augmentation. Calf Veins: No evidence of thrombus. Normal compressibility and flow on color Doppler imaging. Superficial Great Saphenous Vein: No evidence of thrombus. Normal compressibility. Venous Reflux:  None. Other Findings:  None. LEFT LOWER EXTREMITY Common Femoral Vein: No evidence of thrombus. Normal compressibility, respiratory phasicity and response to augmentation. Saphenofemoral Junction: No evidence of thrombus. Normal compressibility and flow on color Doppler imaging. Profunda Femoral Vein: No evidence of thrombus. Normal compressibility and flow on color Doppler imaging. Femoral Vein: No evidence of nonocclusive thrombus with abnormal compressibility, respiratory phasicity and response to augmentation. Popliteal Vein: No evidence of thrombus. Normal compressibility, respiratory phasicity and response to augmentation. Calf Veins: No evidence of thrombus. Normal compressibility and flow on color Doppler imaging. Superficial Great Saphenous Vein: No evidence of thrombus. Normal compressibility. Venous Reflux:  None. Other Findings:  None. IMPRESSION: 1. Nonocclusive DVT within the LEFT femoral vein. 2. No evidence of DVT within the RIGHT lower extremity. Electronically Signed   By: Aram Candela M.D.   On: 09/29/2022 20:34    Labs:  CBC: Recent Labs    10/01/22 0142 10/22/22 1111 10/23/22 0421 10/24/22 0450 10/24/22 0715  WBC 8.3 8.7 7.0 6.9  --   HGB 8.2* 8.2* 7.5* 6.7* 6.5*  HCT 27.0* 28.1* 25.9* 23.1* 22.4*  PLT 283 333 308 285  --     COAGS: No results for input(s): "INR", "APTT" in the last 8760 hours.  BMP: Recent Labs    09/29/22 1710 09/30/22 0900 10/22/22 1111  10/23/22 0421  NA 139 139 138 138  K 3.6 3.8 3.6 3.1*  CL 102 106 103 103  CO2 28 27 27 26   GLUCOSE 115* 98 120* 135*  BUN 9 6* 10 9  CALCIUM 9.1 8.2* 8.7* 8.5*  CREATININE 0.68 0.73 0.63 0.64  GFRNONAA >60 >60 >60 >60    LIVER FUNCTION TESTS: Recent Labs    07/01/22 1131 09/30/22 0900  BILITOT 0.5 0.6  AST 18 14*  ALT 12 10  ALKPHOS 88 105  PROT 6.8 6.2*  ALBUMIN 3.4* 3.1*    TUMOR MARKERS: No results for input(s): "AFPTM", "CEA", "CA199", "CHROMGRNA" in the last 8760 hours.  Assessment and Plan:  Chronic DVT; anemia: Stefanie [name], 82 year old female, is tentatively scheduled today for an image-guided inferior vena cava filter placement. She will be transported to [name] via [place] and she will return to [facility] post-procedure. These plans were discussed with the patient at the bedside and consent was signed by the patient. The procedure was also discussed over the phone with the patient's granddaughter. Verbal consent obtained from [employer]. The patient's hemoglobin level is currently 6.5 and one unit PRBCs has been ordered.   Risks and benefits discussed with the patient including, but not limited to bleeding, infection, contrast induced renal failure, filter fracture or migration which can lead to emergency surgery or even death, strut penetration with damage or irritation to adjacent structures and caval thrombosis.  All of the patient's questions were answered, patient is agreeable to proceed. She has been NPO.   Consent signed and in the patient's Care Link packet.    Thank you for this interesting consult.  I greatly enjoyed meeting Stefanie [name] and look forward to participating in their care.  A copy of this report was sent to the requesting provider on this date.  Electronically Signed: Alwyn Ren, AGACNP-BC [phone] 10/24/2022, 8:15 AM   I spent a total of 20 Minutes    in face to face in clinical consultation, greater  than 50% of which was counseling/coordinating care for IVC filter placement.

## 2022-10-25 ENCOUNTER — Telehealth (HOSPITAL_COMMUNITY): Payer: Self-pay | Admitting: Pharmacy Technician

## 2022-10-25 ENCOUNTER — Other Ambulatory Visit (HOSPITAL_COMMUNITY): Payer: Self-pay

## 2022-10-25 DIAGNOSIS — W19XXXA Unspecified fall, initial encounter: Secondary | ICD-10-CM | POA: Diagnosis not present

## 2022-10-25 DIAGNOSIS — I82402 Acute embolism and thrombosis of unspecified deep veins of left lower extremity: Secondary | ICD-10-CM | POA: Diagnosis not present

## 2022-10-25 LAB — CBC
HCT: 27.1 % — ABNORMAL LOW (ref 36.0–46.0)
Hemoglobin: 8.3 g/dL — ABNORMAL LOW (ref 12.0–15.0)
MCH: 26.8 pg (ref 26.0–34.0)
MCHC: 30.6 g/dL (ref 30.0–36.0)
MCV: 87.4 fL (ref 80.0–100.0)
Platelets: 280 10*3/uL (ref 150–400)
RBC: 3.1 MIL/uL — ABNORMAL LOW (ref 3.87–5.11)
RDW: 15.8 % — ABNORMAL HIGH (ref 11.5–15.5)
WBC: 7.5 10*3/uL (ref 4.0–10.5)
nRBC: 0 % (ref 0.0–0.2)

## 2022-10-25 LAB — TYPE AND SCREEN
ABO/RH(D): A POS
Antibody Screen: NEGATIVE
Unit division: 0

## 2022-10-25 LAB — HEPARIN LEVEL (UNFRACTIONATED)
Heparin Unfractionated: 0.14 IU/mL — ABNORMAL LOW (ref 0.30–0.70)
Heparin Unfractionated: 0.27 IU/mL — ABNORMAL LOW (ref 0.30–0.70)

## 2022-10-25 LAB — BPAM RBC
Blood Product Expiration Date: 202406012359
ISSUE DATE / TIME: 202405200926

## 2022-10-25 NOTE — Telephone Encounter (Signed)
Pharmacy Patient Advocate Encounter  Insurance verification completed.    The patient is insured through [employer] Medicare Part D   The patient is currently admitted and ran test claims for the following: Eliquis.  Copays and coinsurance results were relayed to Inpatient clinical team.  

## 2022-10-25 NOTE — Plan of Care (Signed)
  Problem: Acute Rehab PT Goals(only PT should resolve) Goal: Pt Will Go Supine/Side To Sit Outcome: Progressing Flowsheets (Taken 10/25/2022 1207) Pt will go Supine/Side to Sit:  with minimal assist  with moderate assist Goal: Patient Will Transfer Sit To/From Stand Outcome: Progressing Flowsheets (Taken 10/25/2022 1207) Patient will transfer sit to/from stand:  with minimal assist  with moderate assist Goal: Pt Will Transfer Bed To Chair/Chair To Bed Outcome: Progressing Flowsheets (Taken 10/25/2022 1207) Pt will Transfer Bed to Chair/Chair to Bed: with mod assist Goal: Pt Will Ambulate Outcome: Progressing Flowsheets (Taken 10/25/2022 1207) Pt will Ambulate:  10 feet  with moderate assist  with rolling walker   12:07 PM, 10/25/22 Ocie Bob, MPT Physical Therapist with [facility] [phone] office [phone] mobile phone

## 2022-10-25 NOTE — Progress Notes (Signed)
PROGRESS NOTE     Stefanie [name], is a 82 y.o. female, DOB - 1941/03/15, JXB:[id number]  Admit date - 10/22/2022   Admitting Physician Stefanie Manner Mariea Clonts, MD  Outpatient Primary MD for the patient is Stefanie Harries, MD  LOS - 3  Chief Complaint  Patient presents with   Fall        Brief Narrative:  82 y.o. 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 -Patient's brother also has recurrent DVTs -Left lower extremity venous Dopplers with new acute deep vein thrombosis in the left popliteal, posterior tibial and left peroneal veins these are new compared to venous Dopplers from 09/29/2022 -Recent venous Dopplers of the left lower extremity from 09/29/2022, as well as the study from 08/24/2022, 07/05/2022 and  07/01/2022 were all reviewed with on-call radiologist -- Doppler finding this admission shows acute new acute extremity DVT that appears to be extensive and occlusive-- --concerns about recent heme positive stool with incomplete GI workup and recurrent falls -Risk versus benefits of anticoagulation discussed with patient and patient's granddaughter Stefanie [name] at [phone]----- 10/25/22 -On 10/24/2022 and underwent IVC placement procedure at [facility] --c/n  IV heparin --Hemoglobin is up to 8.3 from  6.5 after 1 unit on 10/24/2022  -Hold off until 10/26/22 at the earliest to transition to a DOAC due to concerns about further bleeding and recent drop in H&H -After transitioning to DOAC patient should be observed for at least 24 hours prior to discharge to SNF -Given recurrent DVT and strong family  history of DVT high bleeding risk IVC filter was placed-- --consider full anticoagulation for 6 to 12 weeks if patient can tolerated if no bleeding -IVC filter failure rate is higher if anticoagulation is discontinued abruptly immediately after placing IVC filter  - 2)GERD--recent heme positive stool -Incomplete EGD on 09/30/2025 due to poor prep -Patient declines further endoluminal evaluation -Continue Protonix -Watch for bleeding while on anticoagulation   3)Acute on Chronic Anemia-----Hemoglobin dropped to 6.5 transfused 1 unit on 10/24/2022 , Hgb currently up to 8.3 - monitor closely while on full anticoagulation   4)Depression Continue Lexapro 20 mg qhs -May use trazodone nightly as needed   5)Hypothyroidism Continue synthroid 75 mcg qhs   6) status post mechanical fall/ambulatory dysfunction----keep patient on telemetry monitored unit to watch for possible arrhythmias  -CT head and CT C-spine without acute finding -Chest x-ray, left ankle and right shoulder x-rays without acute findings -Prior to admission patient lived alone and ambulated with a walker she had a boot on the left leg due to recent Achilles tendon injury -Very high risk for falls which is very concerning while on full anticoagulation -PT eval appreciated recommends SNF rehab   7)Social/Ethics----plan of care and advanced directive discussed with patient and  patient's granddaughter Stefanie [name] at [phone] -- Patient is a full code -She would like her granddaughter Stefanie [name] to be decision-maker if she is no longer able to make any decisions for herself  Status is: Inpatient   Disposition: The patient is from: Home              Anticipated d/c is to: SNF  Anticipated d/c date is: 2 days              Patient currently is not medically stable to d/c. Barriers: Not Clinically Stable-   Code Status :  -  Code Status: Full Code   Family Communication:      (patient is alert, awake and  coherent), Discussed with granddaughter Stefanie [name] at [phone]  DVT Prophylaxis  :   - SCDs   Place TED hose Start: 10/22/22 1347  Lab Results  Component Value Date   PLT 280 10/25/2022   Inpatient Medications  Scheduled Meds:  escitalopram  20 mg Oral Daily   levothyroxine  75 mcg Oral QHS   pantoprazole (PROTONIX) IV  40 mg Intravenous Q12H   polyethylene glycol  17 g Oral Daily   senna-docusate  2 tablet Oral QHS   sodium chloride flush  3 mL Intravenous Q12H   sodium chloride flush  3 mL Intravenous Q12H   sucralfate  1 g Oral TID WC & HS   Continuous Infusions:  sodium chloride     heparin 1,200 Units/hr (10/25/22 1648)   PRN Meds:.sodium chloride, acetaminophen **OR** acetaminophen, bisacodyl, ondansetron **OR** ondansetron (ZOFRAN) IV, oxyCODONE, sodium chloride flush, traZODone   Anti-infectives (From admission, onward)    None      Subjective: Stefanie [name] today has no fevers, no emesis,  No chest pain,    -No acute/overt bleeding concerns at this time  -Oral intake is fair  Objective: Vitals:   10/24/22 1124 10/24/22 2049 10/25/22 0434 10/25/22 1321  BP: (!) 120/54 123/60 (!) 125/54 (!) 116/46  Pulse: 73 75 70 72  Resp: 14 16 16 17   Temp: 98.6 F (37 C) 98.7 F (37.1 C) 97.6 F (36.4 C) 98.3 F (36.8 C)  TempSrc: Oral Oral Oral Oral  SpO2: 95% 94% 95% 100%  Weight:      Height:        Intake/Output Summary (Last 24 hours) at 10/25/2022 2002 Last data filed at 10/25/2022 1700 Gross per 24 hour  Intake 957.62 ml  Output 2000 ml  Net -1042.38 ml   Filed Weights   10/22/22 1058 10/22/22 1458  Weight: 63 kg 71.7 kg   Physical Exam  Gen:- Awake Alert, in no acute distress HEENT:- [place].AT, No sclera icterus Neck-Supple Neck,No JVD,.  Lungs-  CTAB , fair symmetrical air movement CV- S1, S2 normal, regular  Abd-  +ve B.Sounds, Abd Soft, No tenderness,    Extremity/Skin:- No  edema, pedal pulses present  Psych-affect is appropriate,  oriented x3 Neuro-generalized weakness, no new focal deficits, no tremors -MSK--improving left leg swelling, much improved tenderness and  warmth    Data Reviewed: I have personally reviewed following labs and imaging studies  CBC: Recent Labs  Lab 10/22/22 1111 10/23/22 0421 10/24/22 0450 10/24/22 0715 10/24/22 1550 10/25/22 0211  WBC 8.7 7.0 6.9  --   --  7.5  NEUTROABS 6.5  --   --   --   --   --   HGB 8.2* 7.5* 6.7* 6.5* 8.4* 8.3*  HCT 28.1* 25.9* 23.1* 22.4* 28.5* 27.1*  MCV 88.9 89.6 88.2  --   --  87.4  PLT 333 308 285  --   --  280   Basic Metabolic Panel: Recent Labs  Lab 10/22/22 1111 10/23/22 0421  NA 138 138  K 3.6 3.1*  CL 103 103  CO2 27 26  GLUCOSE 120* 135*  BUN 10 9  CREATININE 0.63 0.64  CALCIUM 8.7* 8.5*   GFR: Estimated Creatinine Clearance: 52.6 mL/min (by C-G formula based on SCr of 0.64 mg/dL). Liver Function Tests:  Radiology Studies: IR IVC FILTER PLMT / S&I Lenise Arena GUID/MOD SED  Result Date: 10/24/2022 CLINICAL DATA:  82 year old female with history of deep vein thrombosis and intolerance to anticoagulation due to gastrointestinal hemorrhage. EXAM: 1. ULTRASOUND GUIDANCE FOR VASCULAR ACCESS OF THE RIGHT internal jugular VEIN. 2. IVC VENOGRAM. 3. PERCUTANEOUS IVC FILTER PLACEMENT. ANESTHESIA/SEDATION: Moderate (conscious) sedation was employed during this procedure. A total of Versed 1.5 mg and Fentanyl 25 mcg was administered intravenously. Moderate Sedation Time: 16 minutes. The patient's level of consciousness and vital signs were monitored continuously by radiology nursing throughout the procedure under my direct supervision. CONTRAST:  40mL OMNIPAQUE IOHEXOL 300 MG/ML  SOLN FLUOROSCOPY TIME:  Sixty-two mGy PROCEDURE: The procedure, risks, benefits, and alternatives were explained to the patient. Questions regarding the procedure were encouraged and answered. The patient understands and consents to the procedure. The patient was prepped with  Betadine in a sterile fashion, and a sterile drape was applied covering the operative field. A sterile gown and sterile gloves were used for the procedure. Local anesthesia was provided with 1% Lidocaine. Under direct ultrasound guidance, a 21 gauge needle was advanced into the right internal jugular vein with ultrasound image documentation performed. After securing access with a micropuncture dilator, a guidewire was advanced into the inferior vena cava. A deployment sheath was advanced over the guidewire. This was utilized to perform IVC venography. The deployment sheath was further positioned in an appropriate location for filter deployment. A Denali IVC filter was then advanced in the sheath. This was then fully deployed in the infrarenal IVC. Final filter position was confirmed with a fluoroscopic spot image. Contrast injection was also performed through the sheath under fluoroscopy to confirm patency of the IVC at the level of the filter. After the procedure the sheath was removed and hemostasis obtained with manual compression. COMPLICATIONS: None. FINDINGS: IVC venography demonstrates a normal caliber IVC with no evidence of thrombus. Renal veins are identified bilaterally. The IVC filter was successfully positioned below the level of the renal veins and is appropriately oriented. This IVC filter has both permanent and retrievable indications. IMPRESSION: Placement of percutaneous IVC filter in infrarenal IVC. IVC venogram shows no evidence of IVC thrombus and normal caliber of the inferior vena cava. This filter does have both permanent and retrievable indications. PLAN: This IVC filter is potentially retrievable. The patient will be assessed for filter retrieval by Interventional Radiology in approximately 8-12 weeks. Further recommendations regarding filter retrieval, continued surveillance or declaration of device permanence, will be made at that time. Marliss Coots, MD Vascular and Interventional  [facility] Electronically Signed   By: Marliss Coots M.D.   On: 10/24/2022 14:26    Scheduled Meds:  escitalopram  20 mg Oral Daily   levothyroxine  75 mcg Oral QHS   pantoprazole (PROTONIX) IV  40 mg Intravenous Q12H   polyethylene glycol  17 g Oral Daily   senna-docusate  2 tablet Oral QHS   sodium chloride flush  3 mL Intravenous Q12H   sodium chloride flush  3 mL Intravenous Q12H   sucralfate  1 g Oral TID WC & HS   Continuous Infusions:  sodium chloride     heparin 1,200 Units/hr (10/25/22 1648)    LOS: 3 days   Shon Hale M.D on 10/25/2022 at 8:02 PM  Go to [email or web address] - for contact info  [facility] - Office  [phone]  If 7PM-7AM, please contact night-coverage [email or web address] 10/25/2022, 8:02 PM

## 2022-10-25 NOTE — Discharge Instructions (Signed)
Food Resources:   Agency Name: [facility] Address: [address] [place], [place] [address] Phone: [phone] Website: www.adtsrc.org Service(s) Offered: Meals on [employer] and Meals with Friends.  Home care, at home assisted living, volunteer services, Center  for Active Retirement, transportation  Agency Name: [facility] Address: Sites vary. Much call first. Food Pantry locations: [address], [place], [place] [address] [employer] Phone: [phone] Website: none Service(s) Offered: Museum/gallery curator, utility assistance if funds available Careers adviser for all of [place], [employer], [employer], Office manager and [employer] for [employer] area only) Walk-in  current Id and current address verification required. WedThurs: 9:30-12:00  Agency Name: [facility] Address: [address] [place], [place] [address] Phone: [phone] or [phone] Website: none Service(s) Offered: Food assistance. Agency Name: [facility] Address: [address] [place], [place] [address] Phone: [phone] Website: BirthdayFever.at Service(s) Offered: Food assistance Monday Nights 5:00PM-5:30PM Agency Name: [employer] Address: [address] [place], [place] [address] Phone: [phone] or [phone] Website: none Service(s) Offered: Serves 1 hot meal a day at 11:00 am Monday-Sunday and 5 pm  on the second and fourth Sunday of each month  Agency Name: [facility] and Human/Social Services Address: [address], [place], [place] [address] Phone: [phone] Website: [email or web address].[email or web address] Service(s) Offered: Sales executive, WIC program. Agency Name: Holiday representative of [facility] Address: [address], [place], [place] / [address] [place], [place]  September 26, 2022 7 Phone: [phone] [place] / [phone]  [place] Website: OpinionTrades.tn NetworkAffair.co.za  Service(s) Offered: Civil Service fast streamer, food pantry, soup kitchen ([place]) emergency financial  assistance, thrift stores, showers & hygiene products ([place]),  Christmas Assistance, spiritual help.  Agency Name: [facility] Address: [address] [place], [place] [address] Phone: [phone] Website: www.[place]hope.com Service(s) Offered: Food pantry Tuesday, Wednesday and Thursday 9am-11:30am  (need appointment) and health clinic (9:00am-11:00am)   Information on my medicine - ELIQUIS (apixaban)  This medication education was reviewed with me or my healthcare representative as part of my discharge preparation.  The pharmacist that spoke with me during my hospital stay was:  Severiano Gilbert, [facility]  Why was Eliquis prescribed for you? Eliquis was prescribed to treat blood clots that may have been found in the veins of your legs (deep vein thrombosis) or in your lungs (pulmonary embolism) and to reduce the risk of them occurring again.  What do You need to know about Eliquis ? The starting dose is 10 mg (two 5 mg tablets) taken TWICE daily for the FIRST SEVEN (7) DAYS, then on 5/29  the dose is reduced to ONE 5 mg tablet taken TWICE daily.  Eliquis may be taken with or without food.   Try to take the dose about the same time in the morning and in the evening. If you have difficulty swallowing the tablet whole please discuss with your pharmacist how to take the medication safely.  Take Eliquis exactly as prescribed and DO NOT stop taking Eliquis without talking to the doctor who prescribed the medication.  Stopping may increase your risk of developing a new blood clot.  Refill your prescription before you run out.  After discharge, you should have regular check-up appointments with your healthcare provider that is prescribing your Eliquis.    What do you do if you miss a dose? If  a dose of ELIQUIS is not taken at the scheduled time,  take it as soon as possible on the same day and twice-daily administration should be resumed. The dose should not be doubled to make up for a missed dose.  Important Safety Information A possible side effect of Eliquis is bleeding. You should call your healthcare provider right away if you experience any of the following: Bleeding from an injury or your nose that does not stop. Unusual colored urine (red or dark brown) or unusual colored stools (red or black). Unusual bruising for unknown reasons. A serious fall or if you hit your head (even if there is no bleeding).  Some medicines may interact with Eliquis and might increase your risk of bleeding or clotting while on Eliquis. To help avoid this, consult your healthcare provider or pharmacist prior to using any new prescription or non-prescription medications, including herbals, vitamins, non-steroidal anti-inflammatory drugs (NSAIDs) and supplements.  This website has more information on Eliquis (apixaban): http://www.eliquis.com/eliquis/home

## 2022-10-25 NOTE — Progress Notes (Signed)
ANTICOAGULATION CONSULT NOTE Pharmacy Consult for Heparin Indication:  DVT Brief A/P: Heparin level subtherapeutic Increase Heparin rate  Allergies  Allergen Reactions   Gabapentin      Dizziness. States made her feel like she was drunk. Unsteady gait and dizziness.         Lidocaine Rash   Metoclopramide Rash   Other Other (See Comments)    DYE+Nitrofurantoin+Brilliant Blue Fcf   Sulfa Antibiotics Rash   Z-Pak [Azithromycin] Rash   Metoclopramide Hcl Other (See Comments)    REACTION: a drawing of all her muscles   Penicillins Other (See Comments)    Unknown    Procaine Hcl Other (See Comments)    Unknown    Macrodantin [Nitrofurantoin Macrocrystal] Rash    Patient Measurements: Height: 5\' 4"  (162.6 cm) Weight: 71.7 kg (158 lb 1.1 oz) IBW/kg (Calculated) : 54.7 Heparin Dosing Weight: 63 kg  Vital Signs: Temp: 98.7 F (37.1 C) (05/20 2049) Temp Source: Oral (05/20 2049) BP: 123/60 (05/20 2049) Pulse Rate: 75 (05/20 2049)  Labs: Recent Labs    10/22/22 1111 10/22/22 2247 10/23/22 0421 10/23/22 0801 10/24/22 0450 10/24/22 0715 10/24/22 1550 10/25/22 0211  HGB 8.2*  --  7.5*  --  6.7* 6.5* 8.4* 8.3*  HCT 28.1*  --  25.9*  --  23.1* 22.4* 28.5* 27.1*  PLT 333  --  308  --  285  --   --  280  HEPARINUNFRC  --  0.31  --  0.39  --   --   --  0.14*  CREATININE 0.63  --  0.64  --   --   --   --   --      Estimated Creatinine Clearance: 52.6 mL/min (by C-G formula based on SCr of 0.64 mg/dL).  Assessment: 82 y.o. female with DVT s/p IVC filter 5/20 for heparin   Goal of Therapy:  Heparin level 0.3-0.7 units/ml Monitor platelets by anticoagulation protocol: Yes   Plan:  Increase Heparin 1150 units/hr Check heparin level in 8 hours.  Geannie Risen, PharmD, BCPS 10/25/2022 3:15 AM

## 2022-10-25 NOTE — Progress Notes (Addendum)
ANTICOAGULATION CONSULT NOTE Pharmacy Consult for Heparin Indication: New DVT  Allergies  Allergen Reactions   Gabapentin      Dizziness. States made her feel like she was drunk. Unsteady gait and dizziness.         Lidocaine Rash   Metoclopramide Rash   Other Other (See Comments)    DYE+Nitrofurantoin+Brilliant Blue Fcf   Sulfa Antibiotics Rash   Z-Pak [Azithromycin] Rash   Metoclopramide Hcl Other (See Comments)    REACTION: a drawing of all her muscles   Penicillins Other (See Comments)    Unknown    Procaine Hcl Other (See Comments)    Unknown    Macrodantin [Nitrofurantoin Macrocrystal] Rash    Patient Measurements: Height: 5\' 4"  (162.6 cm) Weight: 71.7 kg (158 lb 1.1 oz) IBW/kg (Calculated) : 54.7 Heparin Dosing Weight: 63 kg  Vital Signs: Temp: 97.6 F (36.4 C) (05/21 0434) Temp Source: Oral (05/21 0434) BP: 125/54 (05/21 0434) Pulse Rate: 70 (05/21 0434)  Labs: Recent Labs    10/22/22 1111 10/22/22 2247 10/23/22 0421 10/23/22 0801 10/24/22 0450 10/24/22 0715 10/24/22 1550 10/25/22 0211  HGB 8.2*  --  7.5*  --  6.7* 6.5* 8.4* 8.3*  HCT 28.1*  --  25.9*  --  23.1* 22.4* 28.5* 27.1*  PLT 333  --  308  --  285  --   --  280  HEPARINUNFRC  --  0.31  --  0.39  --   --   --  0.14*  CREATININE 0.63  --  0.64  --   --   --   --   --      Estimated Creatinine Clearance: 52.6 mL/min (by C-G formula based on SCr of 0.64 mg/dL).  Assessment: 82 year old female with new acute occlusive deep venous thrombus seen in the left popliteal, posterior tibial, and peroneal veins. Hx of prior DVT previously treated with Eliquis (no longer taking). Pharmacy consulted to initiate heparin therapy.  Heparin level just below goal at 0.27. CBC stable overnight. No bleeding issues noted.   Goal of Therapy:  Heparin level 0.3-0.7 units/ml Monitor platelets by anticoagulation protocol: Yes   Plan:  Increase heparin to 1200 units/hr Recheck heparin level in am  Sheppard Coil PharmD., BCPS Clinical Pharmacist 10/25/2022 7:43 AM

## 2022-10-25 NOTE — TOC Initial Note (Addendum)
Transition of Care [facility]) - Initial/Assessment Note    Patient Details  Name: Stefanie [name] MRN: [id number] Date of Birth: March 04, 1941  Transition of Care [facility]) CM/SW Contact:    Elliot Gault, LCSW Phone Number: 10/25/2022, 11:18 AM  Clinical Narrative:                  Pt admitted from home. PT recommending SNF. Met with pt at bedside to assess and review dc planning.  Pt reports that she lives alone. She was previously independent in ADLs. She is agreeable to SNF rehab at dc. CMS provider options reviewed and will refer as requested. TOC will also start insurance auth.  Resources to address SDOH added to AVS.  MD anticipating dc in 1-2 days. Will follow.   Expected Discharge Plan: Skilled Nursing Facility Barriers to Discharge: Continued Medical Work up   Patient Goals and CMS Choice Patient states their goals for this hospitalization and ongoing recovery are:: rehab CMS Medicare.gov Compare Post Acute Care list provided to:: Patient Choice offered to / list presented to : Patient [place] ownership interest in [facility].provided to:: Patient    Expected Discharge Plan and Services In-house Referral: Clinical Social Work   Post Acute Care Choice: Skilled Nursing Facility Living arrangements for the past 2 months: Single Family Home                                      Prior Living Arrangements/Services Living arrangements for the past 2 months: Single Family Home Lives with:: Self Patient language and need for interpreter reviewed:: Yes Do you feel safe going back to the place where you live?: Yes      Need for Family Participation in Patient Care: No (Comment) Care giver support system in place?: Yes (comment) Current home services: DME Criminal Activity/Legal Involvement Pertinent to Current Situation/Hospitalization: No - Comment as needed  Activities of Daily Living Home Assistive Devices/Equipment: Environmental consultant (specify type), Wheelchair ADL  Screening (condition at time of admission) Patient's cognitive ability adequate to safely complete daily activities?: Yes Is the patient deaf or have difficulty hearing?: No Does the patient have difficulty seeing, even when wearing glasses/contacts?: No Does the patient have difficulty concentrating, remembering, or making decisions?: No Patient able to express need for assistance with ADLs?: Yes Does the patient have difficulty dressing or bathing?: No Independently performs ADLs?: No Communication: Independent Dressing (OT): Needs assistance Is this a change from baseline?: Change from baseline, expected to last >3 days Grooming: Needs assistance Is this a change from baseline?: Change from baseline, expected to last >3 days Feeding: Independent Bathing: Needs assistance Is this a change from baseline?: Change from baseline, expected to last >3 days Toileting: Needs assistance Is this a change from baseline?: Change from baseline, expected to last >3days In/Out Bed: Needs assistance Is this a change from baseline?: Change from baseline, expected to last >3 days Walks in Home: Needs assistance Is this a change from baseline?: Change from baseline, expected to last >3 days Does the patient have difficulty walking or climbing stairs?: Yes Weakness of Legs: None Weakness of Arms/Hands: None  Permission Sought/Granted Permission sought to share information with : Facility Industrial/product designer granted to share information with : Yes, Verbal Permission Granted     Permission granted to share info w AGENCY: SNFs        Emotional Assessment Appearance:: Appears stated age Attitude/Demeanor/Rapport: Engaged  Affect (typically observed): Pleasant Orientation: : Oriented to Self, Oriented to Place, Oriented to  Time, Oriented to Situation Alcohol / Substance Use: Not Applicable Psych Involvement: No (comment)  Admission diagnosis:  Falls [W19.XXXA] Fall, initial  encounter [id number]] Acute deep vein thrombosis (DVT) of other specified vein of left lower extremity (HCC) [I82.492] Acute deep vein thrombosis (DVT) of left lower extremity (HCC) [I82.402] Acute deep vein thrombosis (DVT) of calf muscle vein of left lower extremity (HCC) [I82.462] Patient Active Problem List   Diagnosis Date Noted   Falls 10/22/2022   Acute deep vein thrombosis (DVT) of left lower extremity (HCC) 10/22/2022   Heme positive stool 09/30/2022   Chronic deep vein thrombosis (DVT) (HCC) 09/29/2022   HELICOBACTER PYLORI GASTRITIS 06/27/2007   Hypothyroidism 06/27/2007   B12 DEFICIENCY 06/27/2007   ANXIETY DISORDER 06/27/2007   Depression 06/27/2007   HYPERTENSION 06/27/2007   GERD 06/27/2007   GASTROPARESIS 06/27/2007   PERIUMBILICAL HERNIA 06/27/2007   HIATAL HERNIA 06/27/2007   CONSTIPATION 06/27/2007   IBS 06/27/2007   DEGENERATIVE JOINT DISEASE 06/27/2007   ABDOMINAL PAIN, CHRONIC 06/27/2007   ALLERGY 06/27/2007   ESOPHAGEAL STRICTURE 01/02/2002   PCP:  [name], MD Pharmacy:   [facility] [place], [place] - [address] [place] [address] Phone: [phone] Fax: [phone]     Social Determinants of Health (SDOH) Social History: SDOH Screenings   Food Insecurity: Food Insecurity Present (10/22/2022)  Housing: Low Risk  (10/22/2022)  Transportation Needs: No Transportation Needs (10/22/2022)  Utilities: Not At Risk (10/22/2022)  Tobacco Use: Low Risk  (10/24/2022)   SDOH Interventions:     Readmission Risk Interventions    10/25/2022   11:16 AM  Readmission Risk Prevention Plan  Transportation Screening Complete  Home Care Screening Complete  Medication Review (RN CM) Complete

## 2022-10-25 NOTE — NC FL2 (Signed)
MEDICAID FL2 LEVEL OF CARE FORM     IDENTIFICATION  Patient Name: Stefanie [name] Birthdate: 1940/10/25 Sex: female Admission Date (Current Location): 10/22/2022  [facility] and [place] Number:  [employer] and Address:  [facility],  [address], [place] [address]      Provider Number: [id number]  Attending Physician Name and Address:  Shon Hale, MD  Relative Name and Phone Number:       Current Level of Care: Hospital Recommended Level of Care: Skilled Nursing Facility Prior Approval Number:    Date Approved/Denied:   PASRR Number: [id number]  Discharge Plan: SNF    Current Diagnoses: Patient Active Problem List   Diagnosis Date Noted   Falls 10/22/2022   Acute deep vein thrombosis (DVT) of left lower extremity (HCC) 10/22/2022   Heme positive stool 09/30/2022   Chronic deep vein thrombosis (DVT) (HCC) 09/29/2022   HELICOBACTER PYLORI GASTRITIS 06/27/2007   Hypothyroidism 06/27/2007   B12 DEFICIENCY 06/27/2007   ANXIETY DISORDER 06/27/2007   Depression 06/27/2007   HYPERTENSION 06/27/2007   GERD 06/27/2007   GASTROPARESIS 06/27/2007   PERIUMBILICAL HERNIA 06/27/2007   HIATAL HERNIA 06/27/2007   CONSTIPATION 06/27/2007   IBS 06/27/2007   DEGENERATIVE JOINT DISEASE 06/27/2007   ABDOMINAL PAIN, CHRONIC 06/27/2007   ALLERGY 06/27/2007   ESOPHAGEAL STRICTURE 01/02/2002    Orientation RESPIRATION BLADDER Height & Weight     Self, Time, Situation, Place  Normal Continent Weight: 158 lb 1.1 oz (71.7 kg) Height:  5\' 4"  (162.6 cm)  BEHAVIORAL SYMPTOMS/MOOD NEUROLOGICAL BOWEL NUTRITION STATUS      Continent Diet (see dc summary)  AMBULATORY STATUS COMMUNICATION OF NEEDS Skin   Extensive Assist Verbally Normal                       Personal Care Assistance Level of Assistance  Bathing, Feeding, Dressing Bathing Assistance: Limited assistance Feeding assistance: Independent Dressing Assistance: Limited assistance      Functional Limitations Info  Sight, Hearing, Speech Sight Info: Adequate Hearing Info: Adequate Speech Info: Adequate    SPECIAL CARE FACTORS FREQUENCY  PT (By licensed PT), OT (By licensed OT)     PT Frequency: 5x week OT Frequency: 3x week            Contractures Contractures Info: Not present    Additional Factors Info  Code Status, Allergies Code Status Info: Full Allergies Info: Gabapentin, Lidocaine, Metoclopramide, Other, Sulfa Antibiotics, Z-pak (Azithromycin), Metoclopramide Hcl, Penicillins, Procaine Hcl, Macrodantin (Nitrofurantoin Macrocrystal)           Current Medications (10/25/2022):  This is the current hospital active medication list Current Facility-Administered Medications  Medication Dose Route Frequency Provider Last Rate Last Admin   0.9 %  sodium chloride infusion   Intravenous PRN Emokpae, Courage, MD       acetaminophen (TYLENOL) tablet 650 mg  650 mg Oral Q6H PRN Emokpae, Courage, MD       Or   acetaminophen (TYLENOL) suppository 650 mg  650 mg Rectal Q6H PRN Emokpae, Courage, MD       bisacodyl (DULCOLAX) suppository 10 mg  10 mg Rectal Daily PRN Emokpae, Courage, MD       escitalopram (LEXAPRO) tablet 20 mg  20 mg Oral Daily Emokpae, Courage, MD   20 mg at 10/25/22 0903   heparin ADULT infusion 100 units/mL (25000 units/239mL)  1,150 Units/hr Intravenous Continuous Emokpae, Courage, MD 11.5 mL/hr at 10/25/22 0415 1,150 Units/hr at 10/25/22 0415  levothyroxine (SYNTHROID) tablet 75 mcg  75 mcg Oral QHS Shon Hale, MD   75 mcg at 10/24/22 2204   methocarbamol (ROBAXIN) tablet 500 mg  500 mg Oral TID Shon Hale, MD   500 mg at 10/25/22 0903   ondansetron (ZOFRAN) tablet 4 mg  4 mg Oral Q6H PRN Emokpae, Courage, MD       Or   ondansetron (ZOFRAN) injection 4 mg  4 mg Intravenous Q6H PRN Emokpae, Courage, MD       oxyCODONE (Oxy IR/ROXICODONE) immediate release tablet 5 mg  5 mg Oral Q4H PRN Emokpae, Courage, MD   5 mg at 10/24/22  2324   pantoprazole (PROTONIX) injection 40 mg  40 mg Intravenous Q12H Emokpae, Courage, MD   40 mg at 10/25/22 0904   polyethylene glycol (MIRALAX / GLYCOLAX) packet 17 g  17 g Oral Daily Emokpae, Courage, MD   17 g at 10/25/22 0904   senna-docusate (Senokot-S) tablet 2 tablet  2 tablet Oral QHS Emokpae, Courage, MD   2 tablet at 10/24/22 2204   sodium chloride flush (NS) 0.9 % injection 3 mL  3 mL Intravenous Q12H Emokpae, Courage, MD   3 mL at 10/25/22 0913   sodium chloride flush (NS) 0.9 % injection 3 mL  3 mL Intravenous Q12H Emokpae, Courage, MD   3 mL at 10/25/22 0914   sodium chloride flush (NS) 0.9 % injection 3 mL  3 mL Intravenous PRN Emokpae, Courage, MD       sucralfate (CARAFATE) 1 GM/10ML suspension 1 g  1 g Oral TID WC & HS Emokpae, Courage, MD   1 g at 10/25/22 0903   traZODone (DESYREL) tablet 50 mg  50 mg Oral QHS PRN Shon Hale, MD   50 mg at 10/22/22 2103     Discharge Medications: Please see discharge summary for a list of discharge medications.  Relevant Imaging Results:  Relevant Lab Results:   Additional Information SSN: [id number]  Elliot Gault, LCSW

## 2022-10-25 NOTE — TOC Benefit Eligibility Note (Signed)
Patient Advocate Encounter  Insurance verification completed.    The patient is currently admitted and upon discharge could be taking Eliquis 5 mg.  The current 30 day co-pay is $47.00.   The patient is insured through [employer] Medicare Part D   This test claim was processed through [facility]- copay amounts may vary at other pharmacies due to pharmacy/plan contracts, or as the patient moves through the different stages of their insurance plan.  Kyston Gonce, CPHT Pharmacy Patient Advocate Specialist [facility] Patient Advocate Team Direct Number: [phone]  Fax: [phone]       

## 2022-10-25 NOTE — Evaluation (Signed)
Physical Therapy Evaluation Patient Details Name: Stefanie [name] MRN: [id number] DOB: [birth year] Today's Date: 10/25/2022  History of Present Illness  Stefanie [name]  is a 82 y.o. 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  Complains of right shoulder area pain , she is not sure if she hit her head   Clinical Impression  Patient demonstrates slow labored movement for sitting up at bedside with c/o severe pain LLE with any pressure, movement, unable to fully extend hips/knees when attempting sit to stands due to weakness requiring Max assist to stand pivot to chair using RW.  Patient tolerated sitting up in chair after therapy - nursing staff notified.  Patient will benefit from continued skilled physical therapy in hospital and recommended venue below to increase strength, balance, endurance for safe ADLs and gait.          Recommendations for follow up therapy are one component of a multi-disciplinary discharge planning process, led by the attending physician.  Recommendations may be updated based on patient status, additional functional criteria and insurance authorization.  Follow Up Recommendations Can patient physically be transported by private vehicle: No     Assistance Recommended at Discharge Set up Supervision/Assistance  Patient can return home with the following  A lot of help with bathing/dressing/bathroom;A lot of help with walking and/or transfers;Help with stairs or ramp for  entrance;Assistance with cooking/housework    Equipment Recommendations None recommended by PT  Recommendations for Other Services       Functional Status Assessment Patient has had a recent decline in their functional status and demonstrates the ability to make significant improvements in function in a reasonable and predictable amount of time.     Precautions / Restrictions Precautions Precautions: Fall Restrictions Weight Bearing Restrictions: No      Mobility  Bed Mobility Overal bed mobility: Needs Assistance       Supine to sit: Mod assist, Max assist     General bed mobility comments: increased time, labored movement    Transfers Overall transfer level: Needs assistance Equipment used: Rolling walker (2 wheels) Transfers: Sit to/from Stand, Bed to chair/wheelchair/BSC Sit to Stand: Max assist Stand pivot transfers: Max assist         General transfer comment: unable to fully extend legs during sit to stands, transferring to chair due to BLE weakness and left lower leg pain    Ambulation/Gait                  Stairs            Wheelchair Mobility    Modified Rankin (Stroke Patients Only)       Balance Overall balance assessment: Needs assistance Sitting-balance support: Feet supported, No upper extremity supported Sitting balance-Leahy Scale: Fair Sitting balance - Comments: seated at EOB   Standing balance support: During functional activity, Reliant on assistive device for balance, Bilateral upper extremity supported Standing balance-Leahy Scale: Zero Standing balance comment: poor/zero using RW  Pertinent Vitals/Pain Pain Assessment Pain Assessment: Faces Faces Pain Scale: Hurts even more Pain Location: left ankle, lower leg Pain Descriptors / Indicators: Sore, Grimacing, Guarding Pain Intervention(s): Limited activity within patient's tolerance, Monitored during session, Repositioned     Home Living Family/patient expects to be discharged to:: Private residence Living Arrangements: Alone Available Help at Discharge: Family;Friend(s);Personal care attendant Type of Home: House Home Access: Level entry       Home Layout: One level Home Equipment: Rollator (4 wheels);Cane - single point;BSC/3in1;Toilet riser;Electric scooter Additional Comments: Has aides 5x/week up to 4-5 hrs per day.    Prior Function               Mobility Comments: household ambulator using Rollator using CAM boot LLE ADLs Comments: Has aides 5x/week up to 4-5 hrs per day, assisted by family     Hand Dominance   Dominant Hand: Right    Extremity/Trunk Assessment   Upper Extremity Assessment Upper Extremity Assessment: Generalized weakness    Lower Extremity Assessment Lower Extremity Assessment: Generalized weakness;LLE deficits/detail LLE Deficits / Details: grossly -3/5 LLE: Unable to fully assess due to pain LLE Sensation: WNL LLE Coordination: WNL    Cervical / Trunk Assessment Cervical / Trunk Assessment: Kyphotic  Communication   Communication: No difficulties  Cognition Arousal/Alertness: Awake/alert Behavior During Therapy: WFL for tasks assessed/performed Overall Cognitive Status: Within Functional Limits for tasks assessed                                          General Comments      Exercises     Assessment/Plan    PT Assessment Patient needs continued PT services  PT Problem List Decreased strength;Decreased mobility;Decreased activity tolerance;Decreased balance       PT Treatment Interventions DME instruction;Gait training;Stair training;Functional mobility training;Therapeutic activities;Therapeutic exercise;Balance training;Patient/family education    PT Goals (Current goals can be found in the Care Plan section)  Acute Rehab PT Goals Patient Stated Goal: return home after rehab PT Goal Formulation: With patient Time For  Goal Achievement: 11/08/22 Potential to Achieve Goals: Good    Frequency Min 3X/week     Co-evaluation               AM-PAC PT "6 Clicks" Mobility  Outcome Measure Help needed turning from your back to your side while in a flat bed without using bedrails?: A Lot Help needed moving from lying on your back to sitting on the side of a flat bed without using bedrails?: A Lot Help needed moving to and from a bed to a chair (including a wheelchair)?: A Lot Help needed standing up from a chair using your arms (e.g., wheelchair or bedside chair)?: A Lot Help needed to walk in hospital room?: Total Help needed climbing 3-5 steps with a railing? : Total 6 Click Score: 10    End of Session   Activity Tolerance: Patient tolerated treatment well;Patient limited by fatigue;Patient limited by pain Patient left: in chair;with call bell/phone within reach Nurse Communication: Mobility status PT Visit Diagnosis: Unsteadiness on feet (R26.81);Other abnormalities of gait and mobility (R26.89);Muscle weakness (generalized) (M62.81);Difficulty in walking, not elsewhere classified (R26.2)    Time: 0981-1914 PT Time Calculation (min) (ACUTE ONLY): 30 min   Charges:   PT Evaluation $PT Eval Moderate Complexity: 1 Mod PT Treatments $Therapeutic Activity: 23-37 mins        12:03 PM,  10/25/22 Ocie Bob, MPT Physical Therapist with [facility] [phone] office [phone] mobile phone

## 2022-10-26 DIAGNOSIS — E039 Hypothyroidism, unspecified: Secondary | ICD-10-CM | POA: Diagnosis not present

## 2022-10-26 DIAGNOSIS — W19XXXD Unspecified fall, subsequent encounter: Secondary | ICD-10-CM

## 2022-10-26 DIAGNOSIS — I824Y2 Acute embolism and thrombosis of unspecified deep veins of left proximal lower extremity: Secondary | ICD-10-CM

## 2022-10-26 LAB — CBC
HCT: 27 % — ABNORMAL LOW (ref 36.0–46.0)
Hemoglobin: 8.2 g/dL — ABNORMAL LOW (ref 12.0–15.0)
MCH: 26.5 pg (ref 26.0–34.0)
MCHC: 30.4 g/dL (ref 30.0–36.0)
MCV: 87.4 fL (ref 80.0–100.0)
Platelets: 304 10*3/uL (ref 150–400)
RBC: 3.09 MIL/uL — ABNORMAL LOW (ref 3.87–5.11)
RDW: 15.9 % — ABNORMAL HIGH (ref 11.5–15.5)
WBC: 6.6 10*3/uL (ref 4.0–10.5)
nRBC: 0 % (ref 0.0–0.2)

## 2022-10-26 LAB — BASIC METABOLIC PANEL
Anion gap: 8 (ref 5–15)
BUN: 13 mg/dL (ref 8–23)
CO2: 28 mmol/L (ref 22–32)
Calcium: 8.4 mg/dL — ABNORMAL LOW (ref 8.9–10.3)
Chloride: 100 mmol/L (ref 98–111)
Creatinine, Ser: 0.65 mg/dL (ref 0.44–1.00)
GFR, Estimated: >60 mL/min (ref >60)
Glucose, Bld: 107 mg/dL — ABNORMAL HIGH (ref 70–99)
Potassium: 3.8 mmol/L (ref 3.5–5.1)
Sodium: 136 mmol/L (ref 135–145)

## 2022-10-26 LAB — HEPARIN LEVEL (UNFRACTIONATED): Heparin Unfractionated: 0.34 IU/mL (ref 0.30–0.70)

## 2022-10-26 LAB — SARS CORONAVIRUS 2 BY RT PCR: SARS Coronavirus 2 by RT PCR: NEGATIVE

## 2022-10-26 MED ORDER — APIXABAN 5 MG PO TABS
10.0000 mg | ORAL_TABLET | Freq: Two times a day (BID) | ORAL | Status: DC
  Administered 2022-10-26 – 2022-10-27 (×3): 10 mg via ORAL
  Filled 2022-10-26 (×3): qty 2

## 2022-10-26 MED ORDER — APIXABAN 5 MG PO TABS: 5.0000 mg | ORAL_TABLET | Freq: Two times a day (BID) | ORAL | Status: DC

## 2022-10-26 MED ORDER — PANTOPRAZOLE SODIUM 40 MG PO TBEC
40.0000 mg | DELAYED_RELEASE_TABLET | Freq: Two times a day (BID) | ORAL | Status: DC
  Administered 2022-10-26 – 2022-10-27 (×3): 40 mg via ORAL
  Filled 2022-10-26 (×3): qty 1

## 2022-10-26 NOTE — Progress Notes (Signed)
PROGRESS NOTE     Stefanie [name], is a 82 y.o. female, DOB - 1940-12-18, ZOX:[id number]  Admit date - 10/22/2022   Admitting Physician Stefanie Mariea Clonts, MD  Outpatient Primary MD for the patient is Stefanie Harries, MD  LOS - 4  Chief Complaint  Patient presents with   Fall        Brief Narrative:  82 y.o. 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 -Patient's brother also has recurrent DVTs -Left lower extremity venous Dopplers with new acute deep vein thrombosis in the left popliteal, posterior tibial and left peroneal veins these are new compared to venous Dopplers from 09/29/2022 -Recent venous Dopplers of the left lower extremity from 09/29/2022, as well as the study from 08/24/2022, 07/05/2022 and  07/01/2022 were all reviewed with on-call radiologist -- Doppler finding this admission shows acute new acute extremity DVT that appears to be extensive and occlusive-- --concerns about recent heme positive stool with incomplete GI workup and recurrent falls -Risk versus benefits of anticoagulation discussed with patient and patient's granddaughter [name] at [phone]----- 10/25/22 -On 10/24/2022 and underwent IVC placement procedure at [facility] --c/n  IV heparin --Hg stable at 8.2 --transition to apixaban 5/22  --anticipate DC to SNF on 5/23 if remains stable  -Given recurrent DVT and strong family history of DVT high bleeding risk IVC filter was placed-- --consider full anticoagulation for 6 to 12 weeks if patient can tolerated if no bleeding -IVC filter failure rate is higher if  anticoagulation is discontinued abruptly immediately after placing IVC filter  GERD--recent heme positive stool -Incomplete EGD on 09/30/2025 due to poor prep -Patient declines further endoluminal evaluation -Continue Protonix -Watch for bleeding while on anticoagulation   Acute on Chronic Anemia-----Hemoglobin dropped to 6.5 transfused 1 unit on 10/24/2022, Hgb currently stable at 8.2 - recheck CBC in AM    Depression Continue Lexapro 20 mg qhs -May use trazodone nightly as needed   Hypothyroidism Continue synthroid 75 mcg qhs   status post mechanical fall/ambulatory dysfunction----keep patient on telemetry monitored unit to watch for possible arrhythmias  -CT head and CT C-spine without acute finding -Chest x-ray, left ankle and right shoulder x-rays without acute findings -Prior to admission patient lived alone and ambulated with a walker she had a boot on the left leg due to recent Achilles tendon injury -Very high risk for falls which is very concerning while on full anticoagulation -PT eval appreciated recommends SNF rehab   Social/Ethics----plan of care and advanced directive discussed with patient and  patient's granddaughter [name] at [phone] -- Patient is a full code -She would like her granddaughter [name] to be decision-maker if she is no longer able to make any decisions for herself  Status is: Inpatient   Disposition: The patient is from: Home              Anticipated d/c is to: SNF              Anticipated d/c date is: 10/27/22               Patient currently is not medically stable to d/c. Barriers:  Not Clinically Stable-   Code Status :  -  Code Status: Full Code   Family Communication:      (patient is alert, awake and coherent), Discussed with granddaughter [name] at [phone]  DVT Prophylaxis  :   - SCDs    apixaban (ELIQUIS) tablet 10 mg  apixaban (ELIQUIS) tablet 5 mg  Lab Results  Component Value Date   PLT 304  10/26/2022   Inpatient Medications  Scheduled Meds:  apixaban  10 mg Oral BID   Followed by   Melene Muller ON 11/02/2022] apixaban  5 mg Oral BID   escitalopram  20 mg Oral Daily   levothyroxine  75 mcg Oral QHS   pantoprazole  40 mg Oral BID   polyethylene glycol  17 g Oral Daily   senna-docusate  2 tablet Oral QHS   sodium chloride flush  3 mL Intravenous Q12H   sodium chloride flush  3 mL Intravenous Q12H   sucralfate  1 g Oral TID WC & HS   Continuous Infusions:  sodium chloride     PRN Meds:.sodium chloride, acetaminophen **OR** acetaminophen, bisacodyl, ondansetron **OR** ondansetron (ZOFRAN) IV, oxyCODONE, sodium chloride flush, traZODone   Anti-infectives (From admission, onward)    None      Subjective: Stefanie [name] no bleeding complication so far reported, pt agreeable to SNF rehab at [facility]   Objective: Vitals:   10/25/22 0434 10/25/22 1321 10/25/22 2039 10/26/22 0439  BP: (!) 125/54 (!) 116/46 99/86 (!) 119/38  Pulse: 70 72 79 71  Resp: 16 17 16 18   Temp: 97.6 F (36.4 C) 98.3 F (36.8 C) 98.7 F (37.1 C) 98.4 F (36.9 C)  TempSrc: Oral Oral Oral Oral  SpO2: 95% 100% 95% 94%  Weight:      Height:        Intake/Output Summary (Last 24 hours) at 10/26/2022 1203 Last data filed at 10/26/2022 0939 Gross per 24 hour  Intake 833 ml  Output 2200 ml  Net -1367 ml   Filed Weights   10/22/22 1058 10/22/22 1458  Weight: 63 kg 71.7 kg   Physical Exam  Physical Exam Vitals reviewed.  Constitutional:      Appearance: She is not ill-appearing or diaphoretic.  HENT:     Head: Normocephalic and atraumatic.     Nose: Nose normal.     Mouth/Throat:     Mouth: Mucous membranes are moist.  Eyes:     Extraocular Movements: Extraocular movements intact.     Pupils: Pupils are equal, round, and reactive to light.  Cardiovascular:     Rate and Rhythm: Normal rate.  Pulmonary:     Effort: Pulmonary effort is normal.  Abdominal:     General: Abdomen is  flat. Bowel sounds are normal.     Palpations: Abdomen is soft.  Musculoskeletal:        General: Tenderness present.     Cervical back: Normal range of motion.     Comments: RLE tenderness  Skin:    General: Skin is warm and dry.  Neurological:     General: No focal deficit present.     Mental Status: She is alert.    Data Reviewed: I have personally reviewed following labs and imaging studies  CBC: Recent Labs  Lab 10/22/22 1111 10/23/22 0421 10/24/22 0450 10/24/22 0715 10/24/22 1550 10/25/22 0211 10/26/22 0428  WBC 8.7 7.0 6.9  --   --  7.5 6.6  NEUTROABS 6.5  --   --   --   --   --   --  HGB 8.2* 7.5* 6.7* 6.5* 8.4* 8.3* 8.2*  HCT 28.1* 25.9* 23.1* 22.4* 28.5* 27.1* 27.0*  MCV 88.9 89.6 88.2  --   --  87.4 87.4  PLT 333 308 285  --   --  280 304   Basic Metabolic Panel: Recent Labs  Lab 10/22/22 1111 10/23/22 0421 10/26/22 0428  NA 138 138 136  K 3.6 3.1* 3.8  CL 103 103 100  CO2 27 26 28   GLUCOSE 120* 135* 107*  BUN 10 9 13   CREATININE 0.63 0.64 0.65  CALCIUM 8.7* 8.5* 8.4*   GFR: Estimated Creatinine Clearance: 52.6 mL/min (by C-G formula based on SCr of 0.65 mg/dL). Liver Function Tests:  Radiology Studies: IR IVC FILTER PLMT / S&I Lenise Arena GUID/MOD SED  Result Date: 10/24/2022 CLINICAL DATA:  82 year old female with history of deep vein thrombosis and intolerance to anticoagulation due to gastrointestinal hemorrhage. EXAM: 1. ULTRASOUND GUIDANCE FOR VASCULAR ACCESS OF THE RIGHT internal jugular VEIN. 2. IVC VENOGRAM. 3. PERCUTANEOUS IVC FILTER PLACEMENT. ANESTHESIA/SEDATION: Moderate (conscious) sedation was employed during this procedure. A total of Versed 1.5 mg and Fentanyl 25 mcg was administered intravenously. Moderate Sedation Time: 16 minutes. The patient's level of consciousness and vital signs were monitored continuously by radiology nursing throughout the procedure under my direct supervision. CONTRAST:  40mL OMNIPAQUE IOHEXOL 300 MG/ML  SOLN  FLUOROSCOPY TIME:  Sixty-two mGy PROCEDURE: The procedure, risks, benefits, and alternatives were explained to the patient. Questions regarding the procedure were encouraged and answered. The patient understands and consents to the procedure. The patient was prepped with Betadine in a sterile fashion, and a sterile drape was applied covering the operative field. A sterile gown and sterile gloves were used for the procedure. Local anesthesia was provided with 1% Lidocaine. Under direct ultrasound guidance, a 21 gauge needle was advanced into the right internal jugular vein with ultrasound image documentation performed. After securing access with a micropuncture dilator, a guidewire was advanced into the inferior vena cava. A deployment sheath was advanced over the guidewire. This was utilized to perform IVC venography. The deployment sheath was further positioned in an appropriate location for filter deployment. A Denali IVC filter was then advanced in the sheath. This was then fully deployed in the infrarenal IVC. Final filter position was confirmed with a fluoroscopic spot image. Contrast injection was also performed through the sheath under fluoroscopy to confirm patency of the IVC at the level of the filter. After the procedure the sheath was removed and hemostasis obtained with manual compression. COMPLICATIONS: None. FINDINGS: IVC venography demonstrates a normal caliber IVC with no evidence of thrombus. Renal veins are identified bilaterally. The IVC filter was successfully positioned below the level of the renal veins and is appropriately oriented. This IVC filter has both permanent and retrievable indications. IMPRESSION: Placement of percutaneous IVC filter in infrarenal IVC. IVC venogram shows no evidence of IVC thrombus and normal caliber of the inferior vena cava. This filter does have both permanent and retrievable indications. PLAN: This IVC filter is potentially retrievable. The patient will be  assessed for filter retrieval by Interventional Radiology in approximately 8-12 weeks. Further recommendations regarding filter retrieval, continued surveillance or declaration of device permanence, will be made at that time. Marliss Coots, MD [facility] Radiology Electronically Signed   By: Marliss Coots M.D.   On: 10/24/2022 14:26    Scheduled Meds:  apixaban  10 mg Oral BID   Followed by   Melene Muller ON 11/02/2022] apixaban  5 mg Oral BID   escitalopram  20 mg Oral Daily   levothyroxine  75 mcg Oral QHS   pantoprazole  40 mg Oral BID   polyethylene glycol  17 g Oral Daily   senna-docusate  2 tablet Oral QHS   sodium chloride flush  3 mL Intravenous Q12H   sodium chloride flush  3 mL Intravenous Q12H   sucralfate  1 g Oral TID WC & HS   Continuous Infusions:  sodium chloride      LOS: 4 days   Standley Dakins M.D on 10/26/2022 at 12:03 PM  Go to [email or web address] - for contact info  [facility] - Office  [phone]  If 7PM-7AM, please contact night-coverage [email or web address] 10/26/2022, 12:03 PM

## 2022-10-26 NOTE — Progress Notes (Signed)
Mobility Specialist Progress Note:    10/26/22 1232  Mobility  Activity Stood at bedside  Level of Assistance Maximum assist, patient does 25-49%  Assistive Device Front wheel walker  Range of Motion/Exercises Active;All extremities  Activity Response Tolerated well  Mobility Referral Yes  $Mobility charge 1 Mobility  Mobility Specialist Start Time (ACUTE ONLY) 1215  Mobility Specialist Stop Time (ACUTE ONLY) 1230  Mobility Specialist Time Calculation (min) (ACUTE ONLY) 15 min   Pt received in bed, finishing lunch, agreeable to mobility session. Required MaxA to dangle EOB and stand at bedside with RW. Pt unable to pivot to chair, had difficulty bearing weight R foot. Returned pt to bed, all needs met.    Feliciana Rossetti Mobility Specialist Please contact via Special educational needs teacher or  Rehab office at [phone]

## 2022-10-26 NOTE — Progress Notes (Addendum)
ANTICOAGULATION CONSULT NOTE Pharmacy Consult for Heparin>>apixaban Indication: New DVT  Allergies  Allergen Reactions   Gabapentin      Dizziness. States made her feel like she was drunk. Unsteady gait and dizziness.         Lidocaine Rash   Metoclopramide Rash   Other Other (See Comments)    DYE+Nitrofurantoin+Brilliant Blue Fcf   Sulfa Antibiotics Rash   Z-Pak [Azithromycin] Rash   Metoclopramide Hcl Other (See Comments)    REACTION: a drawing of all her muscles   Penicillins Other (See Comments)    Unknown    Procaine Hcl Other (See Comments)    Unknown    Macrodantin [Nitrofurantoin Macrocrystal] Rash    Patient Measurements: Height: 5\' 4"  (162.6 cm) Weight: 71.7 kg (158 lb 1.1 oz) IBW/kg (Calculated) : 54.7 Heparin Dosing Weight: 63 kg  Vital Signs: Temp: 98.4 F (36.9 C) (05/22 0439) Temp Source: Oral (05/22 0439) BP: 119/38 (05/22 0439) Pulse Rate: 71 (05/22 0439)  Labs: Recent Labs    10/24/22 0450 10/24/22 0715 10/24/22 1550 10/25/22 0211 10/25/22 1227 10/26/22 0428  HGB 6.7*   < > 8.4* 8.3*  --  8.2*  HCT 23.1*   < > 28.5* 27.1*  --  27.0*  PLT 285  --   --  280  --  304  HEPARINUNFRC  --   --   --  0.14* 0.27* 0.34  CREATININE  --   --   --   --   --  0.65   < > = values in this interval not displayed.     Estimated Creatinine Clearance: 52.6 mL/min (by C-G formula based on SCr of 0.65 mg/dL).  Assessment: 82 year old female with new acute occlusive deep venous thrombus seen in the left popliteal, posterior tibial, and peroneal veins. Hx of prior DVT previously treated with Eliquis (no longer taking). Pharmacy consulted to initiate heparin therapy.  Heparin level at goal at 0.34. CBC stable overnight. No bleeding issues noted.   Goal of Therapy:  Heparin level 0.3-0.7 units/ml Monitor platelets by anticoagulation protocol: Yes   Plan:  Transition to apixaban 10mg  bid x 7 days then 5mg  bid this morning   Sheppard Coil PharmD.,  BCPS Clinical Pharmacist 10/26/2022 8:00 AM

## 2022-10-26 NOTE — TOC Progression Note (Signed)
Transition of Care [facility]) - Progression Note    Patient Details  Name: Saleah Angers MRN: [id number] Date of Birth: 05-Apr-1941  [facility]) CM/SW Contact  Leitha Bleak, RN Phone Number: 10/26/2022, 10:59 AM  Clinical Narrative:   [facility]erkley Harvey Received -5/22 - 5/24, next review 5/24, Vesta Mixer id #[id number]. Kerri updated, Patient will need a COVID test at discharge. Planning to DC tomorrow.    Expected Discharge Plan: Skilled Nursing Facility Barriers to Discharge: Continued Medical Work up  Expected Discharge Plan and Services In-house Referral: Clinical Social Work   Post Acute Care Choice: Skilled Nursing Facility Living arrangements for the past 2 months: Single Family Home                       Social Determinants of Health (SDOH) Interventions SDOH Screenings   Food Insecurity: Food Insecurity Present (10/22/2022)  Housing: Low Risk  (10/22/2022)  Transportation Needs: No Transportation Needs (10/22/2022)  Utilities: Not At Risk (10/22/2022)  Tobacco Use: Low Risk  (10/24/2022)    Readmission Risk Interventions    10/25/2022   11:16 AM  Readmission Risk Prevention Plan  Transportation Screening Complete  Home Care Screening Complete  Medication Review (RN CM) Complete

## 2022-10-27 ENCOUNTER — Encounter: Payer: Self-pay | Admitting: Adult Health

## 2022-10-27 ENCOUNTER — Non-Acute Institutional Stay (SKILLED_NURSING_FACILITY): Payer: Medicare Other | Admitting: Adult Health

## 2022-10-27 DIAGNOSIS — I824Y2 Acute embolism and thrombosis of unspecified deep veins of left proximal lower extremity: Secondary | ICD-10-CM | POA: Diagnosis not present

## 2022-10-27 DIAGNOSIS — I10 Essential (primary) hypertension: Secondary | ICD-10-CM

## 2022-10-27 DIAGNOSIS — I82562 Chronic embolism and thrombosis of left calf muscular vein: Secondary | ICD-10-CM | POA: Diagnosis not present

## 2022-10-27 DIAGNOSIS — E039 Hypothyroidism, unspecified: Secondary | ICD-10-CM | POA: Diagnosis not present

## 2022-10-27 DIAGNOSIS — D649 Anemia, unspecified: Secondary | ICD-10-CM

## 2022-10-27 DIAGNOSIS — E559 Vitamin D deficiency, unspecified: Secondary | ICD-10-CM

## 2022-10-27 DIAGNOSIS — W19XXXD Unspecified fall, subsequent encounter: Secondary | ICD-10-CM | POA: Diagnosis not present

## 2022-10-27 DIAGNOSIS — K5909 Other constipation: Secondary | ICD-10-CM

## 2022-10-27 DIAGNOSIS — K219 Gastro-esophageal reflux disease without esophagitis: Secondary | ICD-10-CM

## 2022-10-27 DIAGNOSIS — E876 Hypokalemia: Secondary | ICD-10-CM

## 2022-10-27 DIAGNOSIS — F32A Depression, unspecified: Secondary | ICD-10-CM

## 2022-10-27 DIAGNOSIS — R4189 Other symptoms and signs involving cognitive functions and awareness: Secondary | ICD-10-CM

## 2022-10-27 DIAGNOSIS — R29818 Other symptoms and signs involving the nervous system: Secondary | ICD-10-CM

## 2022-10-27 DIAGNOSIS — G319 Degenerative disease of nervous system, unspecified: Secondary | ICD-10-CM

## 2022-10-27 LAB — CBC
HCT: 29 % — ABNORMAL LOW (ref 36.0–46.0)
Hemoglobin: 8.8 g/dL — ABNORMAL LOW (ref 12.0–15.0)
MCH: 26.9 pg (ref 26.0–34.0)
MCHC: 30.3 g/dL (ref 30.0–36.0)
MCV: 88.7 fL (ref 80.0–100.0)
Platelets: 314 10*3/uL (ref 150–400)
RBC: 3.27 MIL/uL — ABNORMAL LOW (ref 3.87–5.11)
RDW: 15.7 % — ABNORMAL HIGH (ref 11.5–15.5)
WBC: 7.9 10*3/uL (ref 4.0–10.5)
nRBC: 0 % (ref 0.0–0.2)

## 2022-10-27 MED ORDER — APIXABAN 5 MG PO TABS: ORAL_TABLET | ORAL | Status: DC

## 2022-10-27 MED ORDER — OXYCODONE HCL 5 MG PO TABS: 5.0000 mg | ORAL_TABLET | Freq: Four times a day (QID) | ORAL | 0 refills | Status: AC | PRN

## 2022-10-27 MED ORDER — FEXOFENADINE HCL 60 MG PO TABS: 60.0000 mg | ORAL_TABLET | Freq: Two times a day (BID) | ORAL | Status: AC | PRN

## 2022-10-27 MED ORDER — POLYETHYLENE GLYCOL 3350 17 G PO PACK: 17.0000 g | PACK | Freq: Every day | ORAL | 0 refills | Status: DC

## 2022-10-27 NOTE — Progress Notes (Unsigned)
Location:  [facility] Room Number: 133 Place of Service:  SNF (31)   CODE STATUS: FULL CODE  Allergies  Allergen Reactions   Gabapentin      Dizziness. States made her feel like she was drunk. Unsteady gait and dizziness.         Lidocaine Rash   Metoclopramide Rash   Other Other (See Comments)    DYE+Nitrofurantoin+Brilliant Blue Fcf   Sulfa Antibiotics Rash   Z-Pak [Azithromycin] Rash   Metoclopramide Hcl Other (See Comments)    REACTION: a drawing of all her muscles   Penicillins Other (See Comments)    Unknown    Procaine Hcl Other (See Comments)    Unknown    Macrodantin [Nitrofurantoin Macrocrystal] Rash    Chief Complaint  Patient presents with   Hospitalization Follow-up    Hospital follow up    HPI:  She is a 82 year old woman who has been hospitalized from 10-22-22 through 10-27-22. Her medical history includes: GERD; recurrent lower extremity dvt's ( previously on eliquis) depression, fibromyalgia; hypothyroidism.  She was admitted to the hospital on 10-22-22 for acute left lower extremity dvt.  New acute left lower extremity dvt: has history of dvt's in the left popliteal, posterior tibial and left peroneal veins. The dvt appears to be extensive and occlusive. She does have heme positive stool with an incomplete GI workup. She has had recurrent falls. The risks vs benefits of anticoagulation therapy was discussed with patient and family. The decision was made for full anticoagulation.  On 10-24-22 underwent IVC filter placement. She did require transfusion of prbc's.  She has had falls at home the workup was negative. She is here for short term rehab with her goal to return back home at this time. She will continue to be followed for her chronic illnesses including: ***      Past Medical History:  Diagnosis Date   Anxiety    Arthritis    Colon polyps    Depression    Diverticulosis of colon (without mention of hemorrhage)     Fibromyalgia    GERD (gastroesophageal reflux disease)    Hiatal hernia    History of IBS    History of kidney stones    Hypothyroid    Hypothyroidism     Past Surgical History:  Procedure Laterality Date   ABDOMINAL HYSTERECTOMY     ANKLE SURGERY     bilateral    APPENDECTOMY  1985   BREAST BIOPSY Left    Benign    CHOLECYSTECTOMY  1985   ESOPHAGEAL MANOMETRY N/A 10/22/2012   Procedure: ESOPHAGEAL MANOMETRY (EM);  Surgeon: Mardella Layman, MD;  Location: [facility] ENDOSCOPY;  Service: Endoscopy;  Laterality: N/A;   ESOPHAGOGASTRODUODENOSCOPY N/A 10/01/2022   Procedure: ESOPHAGOGASTRODUODENOSCOPY (EGD);  Surgeon: [name], MD;  Location: [facility] ENDOSCOPY;  Service: Gastroenterology;  Laterality: N/A;   EXTRACORPOREAL SHOCK WAVE LITHOTRIPSY Left 11/26/2018   Procedure: EXTRACORPOREAL SHOCK WAVE LITHOTRIPSY (ESWL);  Surgeon: Marcine Matar, MD;  Location: [facility] ORS;  Service: Urology;  Laterality: Left;   HEMORRHOID SURGERY     IR IVC FILTER PLMT / S&I /IMG GUID/MOD SED  10/24/2022   RECTOCELE REPAIR     TUBAL LIGATION      Social History   Socioeconomic History   Marital status: Widowed    Spouse name: Not on file   Number of children: 1   Years of education: Not on file   Highest education level: Not on file  Occupational History   Occupation: retired    Associate Professor: RETIRED  Tobacco Use   Smoking status: Never   Smokeless tobacco: Never  Vaping Use   Vaping Use: Never used  Substance and Sexual Activity   Alcohol use: No    Alcohol/week: 0.0 standard drinks of alcohol   Drug use: No   Sexual activity: Not on file  Other Topics Concern   Not on file  Social History Narrative   Not on file   Social Determinants of Health   Financial Resource Strain: Not on file  Food Insecurity: Food Insecurity Present (10/22/2022)   Hunger Vital Sign    Worried About Running Out of Food in the Last Year: Sometimes true    Ran Out of Food in the Last Year: Sometimes true   Transportation Needs: No Transportation Needs (10/22/2022)   PRAPARE - Administrator, Civil Service (Medical): No    Lack of Transportation (Non-Medical): No  Physical Activity: Not on file  Stress: Not on file  Social Connections: Not on file  Intimate Partner Violence: Not At Risk (10/22/2022)   Humiliation, Afraid, Rape, and Kick questionnaire    Fear of Current or Ex-Partner: No    Emotionally Abused: No    Physically Abused: No    Sexually Abused: No   Family History  Problem Relation Age of Onset   Colon polyps Brother    Breast cancer Neg Hx       VITAL SIGNS BP 136/66   Pulse 71   Temp (!) 97.5 F (36.4 C)   Resp 19   Ht 5\' 4"  (1.626 m)   Wt 146 lb 6.4 oz (66.4 kg)   SpO2 99%   BMI 25.13 kg/m   Outpatient Encounter Medications as of 10/27/2022  Medication Sig   acetaminophen (TYLENOL) 500 MG tablet Take 2 tablets (1,000 mg total) by mouth every 6 (six) hours as needed for moderate pain.   [START ON 11/02/2022] apixaban (ELIQUIS) 5 MG TABS tablet 2 po BID thru 11/01/22 then 1 po BID   escitalopram (LEXAPRO) 20 MG tablet Take 20 mg by mouth daily.   fexofenadine (ALLEGRA) 60 MG tablet Take 1 tablet (60 mg total) by mouth 2 (two) times daily as needed for allergies or rhinitis.   levothyroxine (SYNTHROID, LEVOTHROID) 75 MCG tablet Take 75 mcg by mouth at bedtime.   oxyCODONE (OXY IR/ROXICODONE) 5 MG immediate release tablet Take 1 tablet (5 mg total) by mouth every 6 (six) hours as needed for up to 5 days for severe pain.   pantoprazole (PROTONIX) 40 MG tablet Take 1 tablet (40 mg total) by mouth 2 (two) times daily.   [START ON 10/28/2022] polyethylene glycol (MIRALAX / GLYCOLAX) 17 g packet Take 17 g by mouth daily.   potassium chloride (KLOR-CON) 10 MEQ tablet Take 10 mEq by mouth daily.   Vitamin D, Ergocalciferol, (DRISDOL) 1.25 MG (50000 UNIT) CAPS capsule Take 50,000 Units by mouth every 7 (seven) days.   No facility-administered encounter  medications on file as of 10/27/2022.     SIGNIFICANT DIAGNOSTIC EXAMS  TODAY  10-22-22: ct of head and cervical spine 1. No evidence of acute intracranial abnormality. Atrophy and chronic small-vessel white matter ischemic changes. 2. No static evidence of acute injury to the cervical spine. Multilevel degenerative changes again noted.  10-22-22: left lower extremity venous doppler:  New acute occlusive deep venous thrombus seen in the left popliteal, posterior tibial, and peroneal veins.   LABS REVIEWED:  10-22-22: wbc 8.7; hgb 8.2; hct 28.1 mcv 88.9 plt 333; glucose 120; bun 10; creat 0.63; k+ 3.6; na++ 138; ca 8.7; gfr >60 10-24-22: wbc 6.9; hgb 6.7; hct 23.1; mcv 88.2 plt 285;  10-25-22: wbc 7.5; hgb 8.3; hct 27.1; mcv 87.4 plt 280 10-26-22: glucose 107; bun 13; creat 0.65; k+ 3.8; na++ 136; ca 8.4 gfr >60 10-27-22: wbc 7.9; hgb 8.8; hct 29.0; mcv 88.7 plt 314   Review of Systems  Reason unable to perform ROS: unable to fully participate.   Physical Exam Constitutional:      General: She is not in acute distress.    Appearance: She is well-developed. She is not diaphoretic.  Neck:     Thyroid: No thyromegaly.  Cardiovascular:     Rate and Rhythm: Normal rate and regular rhythm.     Pulses: Normal pulses.     Heart sounds: Normal heart sounds.  Pulmonary:     Effort: Pulmonary effort is normal. No respiratory distress.     Breath sounds: Normal breath sounds.  Abdominal:     General: Bowel sounds are normal. There is no distension.     Palpations: Abdomen is soft.     Tenderness: There is no abdominal tenderness.  Musculoskeletal:        General: Normal range of motion.     Cervical back: Neck supple.     Right lower leg: No edema.     Left lower leg: No edema.  Lymphadenopathy:     Cervical: No cervical adenopathy.  Skin:    General: Skin is warm and dry.  Neurological:     Mental Status: She is alert. Mental status is at baseline.  Psychiatric:        Mood and  Affect: Mood normal.      ASSESSMENT/ PLAN:     Synthia Innocent NP [facility]  call [phone]

## 2022-10-27 NOTE — Progress Notes (Signed)
Physical Therapy Treatment Patient Details Name: Stefanie [name] MRN: [id number] DOB: January 15, 1941 Today's Date: 10/27/2022   History of Present Illness Stefanie [name]  is a 82 y.o. 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  Complains of right shoulder area pain , she is not sure if she hit her head    PT Comments    Patient demonstrates slow labored movement for sitting up at bedside, once seated has difficulty keeping left foot under knee due to excessive internal rotation of LLE, fair/good return for completing BLE ROM/strengthening exercises with verbal cueing and demonstration.  Patient able to take a few slow labored unsteady side steps before having to sit due to buckling of knees/weakness.  Patient tolerated sitting up in chair after therapy - nurse aware.  Patient will benefit from continued skilled physical therapy in hospital and recommended venue below to increase strength, balance, endurance for safe ADLs and gait.       Recommendations for follow up therapy are one component of a multi-disciplinary discharge planning process, led by the attending physician.  Recommendations may be updated based on patient status, additional functional criteria and insurance authorization.  Follow Up Recommendations  Can patient physically be transported by private vehicle: No    Assistance Recommended at Discharge Set up Supervision/Assistance  Patient can return home with the following A lot of help  with bathing/dressing/bathroom;A lot of help with walking and/or transfers;Help with stairs or ramp for entrance;Assistance with cooking/housework   Equipment Recommendations  None recommended by PT    Recommendations for Other Services       Precautions / Restrictions Precautions Precautions: Fall Required Braces or Orthoses: Other Brace Other Brace: Wears CAM boot from prior injury Restrictions Weight Bearing Restrictions: No     Mobility  Bed Mobility Overal bed mobility: Needs Assistance Bed Mobility: Supine to Sit     Supine to sit: Mod assist     General bed mobility comments: slow labored movement with c/o LLE pain with movement    Transfers Overall transfer level: Needs assistance Equipment used: Rolling walker (2 wheels) Transfers: Sit to/from Stand, Bed to chair/wheelchair/BSC Sit to Stand: Mod assist, Max assist   Step pivot transfers: Mod assist, Max assist       General transfer comment: unsteady labored movement    Ambulation/Gait Ambulation/Gait assistance: Mod assist, Max assist Gait Distance (Feet): 4 Feet Assistive device: Rolling walker (2 wheels) Gait Pattern/deviations: Decreased step length - right, Decreased step length - left, Decreased stride length Gait velocity: slow     General Gait Details: limited to a few slow labored side steps with diffiuclty moving LLE due to pain/weakness   Stairs             Wheelchair Mobility    Modified Rankin (Stroke Patients Only)       Balance Overall balance assessment: Needs assistance Sitting-balance support: Feet supported, No upper extremity supported Sitting balance-Leahy Scale: Fair Sitting balance - Comments: seated at EOB   Standing balance support:  During functional activity, Reliant on assistive device for balance, Bilateral upper extremity supported Standing balance-Leahy Scale: Poor Standing balance comment: using RW                            Cognition  Arousal/Alertness: Awake/alert Behavior During Therapy: WFL for tasks assessed/performed Overall Cognitive Status: Within Functional Limits for tasks assessed                                          Exercises General Exercises - Lower Extremity Long Arc Quad: Seated, AROM, Strengthening, 10 reps, Both Hip Flexion/Marching: Seated, AROM, Strengthening, Both, 10 reps Toe Raises: Seated, AROM, Strengthening, Both, 10 reps Heel Raises: Seated, AROM, Strengthening, Both, 10 reps    General Comments        Pertinent Vitals/Pain Pain Assessment Pain Assessment: 0-10 Pain Score: 6  Pain Location: frontal headache, and LLE with pressure, movement Pain Descriptors / Indicators: Sore, Grimacing, Guarding Pain Intervention(s): Limited activity within patient's tolerance, Monitored during session, Repositioned    Home Living                          Prior Function            PT Goals (current goals can now be found in the care plan section) Acute Rehab PT Goals Patient Stated Goal: return home after rehab PT Goal Formulation: With patient Time For Goal Achievement: 11/08/22 Potential to Achieve Goals: Good Progress towards PT goals: Progressing toward goals    Frequency    Min 3X/week      PT Plan Current plan remains appropriate    Co-evaluation              AM-PAC PT "6 Clicks" Mobility   Outcome Measure  Help needed turning from your back to your side while in a flat bed without using bedrails?: A Lot Help needed moving from lying on your back to sitting on the side of a flat bed without using bedrails?: A Lot Help needed moving to and from a bed to a chair (including a wheelchair)?: A Lot Help needed standing up from a chair using your arms (e.g., wheelchair or bedside chair)?: A Lot Help needed to walk in hospital room?: A Lot Help needed climbing 3-5 steps with a railing? : Total 6 Click Score: 11    End of Session   Activity  Tolerance: Patient tolerated treatment well;Patient limited by fatigue;Patient limited by pain Patient left: in chair;with call bell/phone within reach Nurse Communication: Mobility status PT Visit Diagnosis: Unsteadiness on feet (R26.81);Other abnormalities of gait and mobility (R26.89);Muscle weakness (generalized) (M62.81);Difficulty in walking, not elsewhere classified (R26.2)     Time: 1610-9604 PT Time Calculation (min) (ACUTE ONLY): 20 min  Charges:  $Therapeutic Exercise: 8-22 mins $Therapeutic Activity: 8-22 mins                     11:21 AM, 10/27/22 Ocie Bob, MPT Physical Therapist with [facility] 336 [phone] office [phone] mobile phone

## 2022-10-27 NOTE — Care Management Important Message (Signed)
Important Message  Patient Details  Name: Stefanie [name] MRN: [id number] Date of Birth: Sep 20, 1940   Medicare Important Message Given:  Yes     Corey Harold 10/27/2022, 11:03 AM

## 2022-10-27 NOTE — TOC Transition Note (Signed)
Transition of Care [facility]) - CM/SW Discharge Note   Patient Details  Name: Stefanie [name] MRN: [id number] Date of Birth: Nov 20, 1940  Transition of Care [facility]) CM/SW Contact:  Leitha Bleak, RN Phone Number: 10/27/2022, 10:26 AM   Clinical Narrative:   Patient medically ready to discharge to [facility]. CM called grand daughter to update her. RN will call report. DC summary sent in hub.   Final next level of care: Skilled Nursing Facility Barriers to Discharge: Barriers Resolved  Patient Goals and CMS Choice CMS Medicare.gov Compare Post Acute Care list provided to:: Patient Choice offered to / list presented to : Adult Children  Discharge Placement                Patient to be transferred to facility by: [facility] staff Name of family member notified: Grand daughter Patient and family notified of of transfer: 10/27/22  Discharge Plan and Services Additional resources added to the After Visit Summary for   In-house Referral: Clinical Social Work   Post Acute Care Choice: Skilled Nursing Facility               Social Determinants of Health (SDOH) Interventions SDOH Screenings   Food Insecurity: Food Insecurity Present (10/22/2022)  Housing: Low Risk  (10/22/2022)  Transportation Needs: No Transportation Needs (10/22/2022)  Utilities: Not At Risk (10/22/2022)  Tobacco Use: Low Risk  (10/24/2022)    Readmission Risk Interventions    10/25/2022   11:16 AM  Readmission Risk Prevention Plan  Transportation Screening Complete  Home Care Screening Complete  Medication Review (RN CM) Complete

## 2022-10-27 NOTE — Progress Notes (Signed)
Nsg Discharge Note  Admit Date:  10/22/2022 Discharge date: 10/27/2022   Raelyn Ensign to be D/C'd Skilled nursing facility per MD order.  AVS completed.   Patient/caregiver able to verbalize understanding.  Discharge Medication: Allergies as of 10/27/2022       Reactions   Gabapentin     Dizziness. States made her feel like she was drunk. Unsteady gait and dizziness.      Lidocaine Rash   Metoclopramide Rash   Other Other (See Comments)   DYE+Nitrofurantoin+Brilliant Blue Fcf   Sulfa Antibiotics Rash   Z-pak [azithromycin] Rash   Metoclopramide Hcl Other (See Comments)   REACTION: a drawing of all her muscles   Penicillins Other (See Comments)   Unknown    Procaine Hcl Other (See Comments)   Unknown    Macrodantin [nitrofurantoin Macrocrystal] Rash        Medication List     TAKE these medications    acetaminophen 500 MG tablet Commonly known as: TYLENOL Take 2 tablets (1,000 mg total) by mouth every 6 (six) hours as needed for moderate pain.   apixaban 5 MG Tabs tablet Commonly known as: ELIQUIS 2 po BID thru 11/01/22 then 1 po BID Start taking on: Nov 02, 2022   escitalopram 20 MG tablet Commonly known as: LEXAPRO Take 20 mg by mouth daily.   fexofenadine 60 MG tablet Commonly known as: ALLEGRA Take 1 tablet (60 mg total) by mouth 2 (two) times daily as needed for allergies or rhinitis. What changed:  when to take this reasons to take this   levothyroxine 75 MCG tablet Commonly known as: SYNTHROID Take 75 mcg by mouth at bedtime.   oxyCODONE 5 MG immediate release tablet Commonly known as: Oxy IR/ROXICODONE Take 1 tablet (5 mg total) by mouth every 6 (six) hours as needed for up to 5 days for severe pain.   pantoprazole 40 MG tablet Commonly known as: Protonix Take 1 tablet (40 mg total) by mouth 2 (two) times daily.   polyethylene glycol 17 g packet Commonly known as: MIRALAX / GLYCOLAX Take 17 g by mouth daily. Start taking on: Oct 28, 2022    potassium chloride 10 MEQ tablet Commonly known as: KLOR-CON Take 10 mEq by mouth daily.   Vitamin D (Ergocalciferol) 1.25 MG (50000 UNIT) Caps capsule Commonly known as: DRISDOL Take 50,000 Units by mouth every 7 (seven) days.        Discharge Assessment: Vitals:   10/26/22 2027 10/27/22 0427  BP: 112/62 128/60  Pulse: 69 70  Resp: 18 18  Temp: 98.7 F (37.1 C) 98 F (36.7 C)  SpO2:  96%   Skin clean, dry and intact without evidence of skin break down, no evidence of skin tears noted. IV catheter discontinued intact. Site without signs and symptoms of complications - no redness or edema noted at insertion site, patient denies c/o pain - only slight tenderness at site.  Dressing with slight pressure applied.  D/c Instructions-Education: Discharge instructions given to patient/family with verbalized understanding. D/c education completed with patient/family including follow up instructions, medication list, d/c activities limitations if indicated, with other d/c instructions as indicated by MD - patient able to verbalize understanding, all questions fully answered. Patient instructed to return to ED, call 911, or call MD for any changes in condition.  Patient escorted via WC, and D/C home via private auto.  Laurena Spies, RN 10/27/2022 10:20 AM

## 2022-10-27 NOTE — Discharge Summary (Signed)
Physician Discharge Summary  Stefanie [name] YNW:[id number] DOB: [birth year] DOA: 10/22/2022  PCP: [name], MD GI: [name]   Admit date: 10/22/2022 Discharge date: 10/27/2022  Disposition: SNF   Recommendations for Outpatient Follow-up:  Follow up with PCP in 2 weeks Follow up with [facility] on 11/07/22 as scheduled  Please obtain BMP/CBC in 1-2 weeks Bleeding precautions while on anticoagulation therapy  Fall precautions recommended  Follow up with hematology in 1 month to discuss length of anticoagulation therapy  Home Health:  SNF Rehab   Discharge Condition: STABLE   CODE STATUS: FULL DIET: regular as tolerated    Brief Hospitalization Summary: Please see all hospital notes, images, labs for full details of the hospitalization. ADMISSION PROVIDER HPI:  82 y.o. 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 -Patient's brother also has recurrent DVTs -Left lower extremity venous Dopplers with new acute deep vein thrombosis in the left popliteal, posterior tibial and left peroneal veins these are new compared to venous Dopplers from 09/29/2022 -Recent venous Dopplers of the left lower extremity from 09/29/2022, as well as the study from 08/24/2022, 07/05/2022 and 07/01/2022 were all reviewed with on-call radiologist -- Doppler finding this admission shows acute new acute extremity DVT that appears to be extensive and occlusive-- --concerns about recent heme positive stool with incomplete GI workup and recurrent falls -Risk versus benefits of anticoagulation discussed with patient and patient's  granddaughter [name] at [phone] decision made to go ahead with full anticoagulation 10/25/22 -On 10/24/2022 and underwent IVC placement procedure at [facility] --initially treated with IV heparin --Hg stable at 8.8 on day of discharge --transitioned to apixaban 5/22  --DC to SNF on 5/23 as pt remains stable   -Given recurrent DVT and strong family history of DVT high bleeding risk IVC filter was placed-- --consider full anticoagulation for 6 to 12 weeks if patient can tolerate with no significant bleeding, will ask pt to follow up with hematologist to help determine length of therapy -IVC filter failure rate reportedly higher if anticoagulation is discontinued abruptly immediately after placing IVC filter   GERD--recent heme positive stool -Incomplete EGD on 09/30/2025 due to poor prep -Patient declines further endoluminal evaluation -Continue Protonix BID and follow up with [facility] on 11/07/22 as scheduled  -Monitor for bleeding while on anticoagulation   Acute on Chronic Anemia-----Hemoglobin dropped to 6.5 transfused 1 unit on 10/24/2022, Hgb currently stable at 8.2 - recheck CBC in 1-2 weeks outpatient recommended    Depression Continue Lexapro 20 mg qhs   Hypothyroidism Continue synthroid 75 mcg qhs   status post mechanical fall/ambulatory dysfunction -CT head and CT C-spine without acute finding -Chest x-ray, left ankle and right shoulder x-rays without acute findings -Prior to admission patient lived alone and ambulated with a walker she had a boot on the left leg due to recent Achilles tendon injury -Very high risk for falls which is very concerning while on full anticoagulation -PT eval appreciated recommends SNF rehab   Social/Ethics----plan of care and advanced directive discussed with patient and  patient's granddaughter [name] at [phone] -- Patient is a full code -  She would like her granddaughter [name] to be  decision-maker if she is no longer able to make any decisions for herself  Discharge Diagnoses:  Principal Problem:   Falls Active Problems:   Hypothyroidism   HYPERTENSION   Acute deep vein thrombosis (DVT) of left lower extremity [facility])   Discharge Instructions: Discharge Instructions     Ambulatory referral to Hematology / Oncology   Complete by: As directed       Allergies as of 10/27/2022       Reactions   Gabapentin     Dizziness. States made her feel like she was drunk. Unsteady gait and dizziness.      Lidocaine Rash   Metoclopramide Rash   Other Other (See Comments)   DYE+Nitrofurantoin+Brilliant Blue Fcf   Sulfa Antibiotics Rash   Z-pak [azithromycin] Rash   Metoclopramide Hcl Other (See Comments)   REACTION: a drawing of all her muscles   Penicillins Other (See Comments)   Unknown    Procaine Hcl Other (See Comments)   Unknown    Macrodantin [nitrofurantoin Macrocrystal] Rash        Medication List     TAKE these medications    acetaminophen 500 MG tablet Commonly known as: TYLENOL Take 2 tablets (1,000 mg total) by mouth every 6 (six) hours as needed for moderate pain.   apixaban 5 MG Tabs tablet Commonly known as: ELIQUIS 2 po BID thru 11/01/22 then 1 po BID Start taking on: Nov 02, 2022   escitalopram 20 MG tablet Commonly known as: LEXAPRO Take 20 mg by mouth daily.   fexofenadine 60 MG tablet Commonly known as: ALLEGRA Take 1 tablet (60 mg total) by mouth 2 (two) times daily as needed for allergies or rhinitis. What changed:  when to take this reasons to take this   levothyroxine 75 MCG tablet Commonly known as: SYNTHROID Take 75 mcg by mouth at bedtime.   oxyCODONE 5 MG immediate release tablet Commonly known as: Oxy IR/ROXICODONE Take 1 tablet (5 mg total) by mouth every 6 (six) hours as needed for up to 5 days for severe pain.   pantoprazole 40 MG tablet Commonly known as: Protonix Take 1 tablet (40 mg total) by mouth 2  (two) times daily.   polyethylene glycol 17 g packet Commonly known as: MIRALAX / GLYCOLAX Take 17 g by mouth daily. Start taking on: Oct 28, 2022   potassium chloride 10 MEQ tablet Commonly known as: KLOR-CON Take 10 mEq by mouth daily.   Vitamin D (Ergocalciferol) 1.25 MG (50000 UNIT) Caps capsule Commonly known as: DRISDOL Take 50,000 Units by mouth every 7 (seven) days.        Contact information for follow-up providers     [facility] Follow up.   Why: Please follow up with Dr. Elby Showers in 6 months. A scheduler from our office will call you closer to the time of your follow up to arrange a date/time of your appointment. Please call our office with any questions or concerns prior to your visit. Contact information: [address] [place] [address] [phone]         [name], MD. Schedule an appointment as soon as possible for a visit in 2 week(s).   Specialty: Family Medicine Why: Hospital Follow Up Contact information: [address] [place] [address] [phone]         [place]k [place], [place]. Go on 11/07/2022.   Specialty: Gastroenterology Why: at 1:30  pm as scheduled for GI follow up Contact information: [address] [place] [address] [phone]              Contact information for after-discharge care     Destination     [facility] Preferred SNF .   Service: Skilled Nursing Contact information: [address] [place] [address] [phone]                    Allergies  Allergen Reactions   Gabapentin      Dizziness. States made her feel like she was drunk. Unsteady gait and dizziness.         Lidocaine Rash   Metoclopramide Rash   Other Other (See Comments)    DYE+Nitrofurantoin+Brilliant Blue Fcf   Sulfa Antibiotics Rash   Z-Pak [Azithromycin] Rash   Metoclopramide Hcl Other (See Comments)    REACTION: a  drawing of all her muscles   Penicillins Other (See Comments)    Unknown    Procaine Hcl Other (See Comments)    Unknown    Macrodantin [Nitrofurantoin Macrocrystal] Rash   Allergies as of 10/27/2022       Reactions   Gabapentin     Dizziness. States made her feel like she was drunk. Unsteady gait and dizziness.      Lidocaine Rash   Metoclopramide Rash   Other Other (See Comments)   DYE+Nitrofurantoin+Brilliant Blue Fcf   Sulfa Antibiotics Rash   Z-pak [azithromycin] Rash   Metoclopramide Hcl Other (See Comments)   REACTION: a drawing of all her muscles   Penicillins Other (See Comments)   Unknown    Procaine Hcl Other (See Comments)   Unknown    Macrodantin [nitrofurantoin Macrocrystal] Rash        Medication List     TAKE these medications    acetaminophen 500 MG tablet Commonly known as: TYLENOL Take 2 tablets (1,000 mg total) by mouth every 6 (six) hours as needed for moderate pain.   apixaban 5 MG Tabs tablet Commonly known as: ELIQUIS 2 po BID thru 11/01/22 then 1 po BID Start taking on: Nov 02, 2022   escitalopram 20 MG tablet Commonly known as: LEXAPRO Take 20 mg by mouth daily.   fexofenadine 60 MG tablet Commonly known as: ALLEGRA Take 1 tablet (60 mg total) by mouth 2 (two) times daily as needed for allergies or rhinitis. What changed:  when to take this reasons to take this   levothyroxine 75 MCG tablet Commonly known as: SYNTHROID Take 75 mcg by mouth at bedtime.   oxyCODONE 5 MG immediate release tablet Commonly known as: Oxy IR/ROXICODONE Take 1 tablet (5 mg total) by mouth every 6 (six) hours as needed for up to 5 days for severe pain.   pantoprazole 40 MG tablet Commonly known as: Protonix Take 1 tablet (40 mg total) by mouth 2 (two) times daily.   polyethylene glycol 17 g packet Commonly known as: MIRALAX / GLYCOLAX Take 17 g by mouth daily. Start taking on: Oct 28, 2022   potassium chloride 10 MEQ tablet Commonly known as:  KLOR-CON Take 10 mEq by mouth daily.   Vitamin D (Ergocalciferol) 1.25 MG (50000 UNIT) Caps capsule Commonly known as: DRISDOL Take 50,000 Units by mouth every 7 (seven) days.        Procedures/Studies: IR IVC FILTER PLMT / S&I Lenise Arena GUID/MOD SED  Result Date: 10/24/2022 CLINICAL DATA:  82 year old female with history of deep vein  thrombosis and intolerance to anticoagulation due to gastrointestinal hemorrhage. EXAM: 1. ULTRASOUND GUIDANCE FOR VASCULAR ACCESS OF THE RIGHT internal jugular VEIN. 2. IVC VENOGRAM. 3. PERCUTANEOUS IVC FILTER PLACEMENT. ANESTHESIA/SEDATION: Moderate (conscious) sedation was employed during this procedure. A total of Versed 1.5 mg and Fentanyl 25 mcg was administered intravenously. Moderate Sedation Time: 16 minutes. The patient's level of consciousness and vital signs were monitored continuously by radiology nursing throughout the procedure under my direct supervision. CONTRAST:  40mL OMNIPAQUE IOHEXOL 300 MG/ML  SOLN FLUOROSCOPY TIME:  Sixty-two mGy PROCEDURE: The procedure, risks, benefits, and alternatives were explained to the patient. Questions regarding the procedure were encouraged and answered. The patient understands and consents to the procedure. The patient was prepped with Betadine in a sterile fashion, and a sterile drape was applied covering the operative field. A sterile gown and sterile gloves were used for the procedure. Local anesthesia was provided with 1% Lidocaine. Under direct ultrasound guidance, a 21 gauge needle was advanced into the right internal jugular vein with ultrasound image documentation performed. After securing access with a micropuncture dilator, a guidewire was advanced into the inferior vena cava. A deployment sheath was advanced over the guidewire. This was utilized to perform IVC venography. The deployment sheath was further positioned in an appropriate location for filter deployment. A Denali IVC filter was then advanced in the  sheath. This was then fully deployed in the infrarenal IVC. Final filter position was confirmed with a fluoroscopic spot image. Contrast injection was also performed through the sheath under fluoroscopy to confirm patency of the IVC at the level of the filter. After the procedure the sheath was removed and hemostasis obtained with manual compression. COMPLICATIONS: None. FINDINGS: IVC venography demonstrates a normal caliber IVC with no evidence of thrombus. Renal veins are identified bilaterally. The IVC filter was successfully positioned below the level of the renal veins and is appropriately oriented. This IVC filter has both permanent and retrievable indications. IMPRESSION: Placement of percutaneous IVC filter in infrarenal IVC. IVC venogram shows no evidence of IVC thrombus and normal caliber of the inferior vena cava. This filter does have both permanent and retrievable indications. PLAN: This IVC filter is potentially retrievable. The patient will be assessed for filter retrieval by Interventional Radiology in approximately 8-12 weeks. Further recommendations regarding filter retrieval, continued surveillance or declaration of device permanence, will be made at that time. Marliss Coots, MD [facility] Electronically Signed   By: Marliss Coots M.D.   On: 10/24/2022 14:26   US Venous Img Lower  Left (DVT Study)  Result Date: 10/22/2022 CLINICAL DATA:  Left leg pain.  Fall today. EXAM: LEFT LOWER EXTREMITY VENOUS DOPPLER ULTRASOUND TECHNIQUE: Gray-scale sonography with compression, as well as color and duplex ultrasound, were performed to evaluate the deep venous system(s) from the level of the common femoral vein through the popliteal and proximal calf veins. COMPARISON:  09/29/2022 FINDINGS: VENOUS Occlusive thrombus still seen within the left popliteal, posterior tibial and peroneal veins. Otherwise, normal compressibility of the common femoral  and femoral veins. Visualized portions of profunda femoral vein and great saphenous vein unremarkable. No additional filling defects to suggest DVT on grayscale or color Doppler imaging. Doppler waveforms show normal direction of venous flow, normal respiratory plasticity and response to augmentation in the above knee veins. Limited views of the contralateral common femoral vein are unremarkable. OTHER None. Limitations: none IMPRESSION: New acute occlusive deep venous thrombus seen in the left popliteal, posterior tibial, and peroneal  veins. Electronically Signed   By: Acquanetta Belling M.D.   On: 10/22/2022 12:24   CT Head Wo Contrast  Result Date: 10/22/2022 CLINICAL DATA:  82 year old female with head and neck pain following fall. Initial encounter. EXAM: CT HEAD WITHOUT CONTRAST CT CERVICAL SPINE WITHOUT CONTRAST TECHNIQUE: Multidetector CT imaging of the head and cervical spine was performed following the standard protocol without intravenous contrast. Multiplanar CT image reconstructions of the cervical spine were also generated. RADIATION DOSE REDUCTION: This exam was performed according to the departmental dose-optimization program which includes automated exposure control, adjustment of the mA and/or kV according to patient size and/or use of iterative reconstruction technique. COMPARISON:  08/15/2021 CTs and prior studies FINDINGS: CT HEAD FINDINGS Brain: No evidence of acute infarction, hemorrhage, hydrocephalus, extra-axial collection or mass lesion/mass effect. Atrophy and chronic small-vessel white matter ischemic changes again noted. Vascular: Carotid and vertebral atherosclerotic calcifications are noted. Skull: Normal. Negative for fracture or focal lesion. Sinuses/Orbits: No acute finding. Other: None. CT CERVICAL SPINE FINDINGS Alignment: Reversal of the normal cervical lordosis again identified. Minimal anterolisthesis at C3-4, C4-5, C5-6 and at C7-T1 again noted. No new subluxation identified.  Skull base and vertebrae: No acute fracture. No primary bone lesion or focal pathologic process. Soft tissues and spinal canal: No prevertebral fluid or swelling. No visible canal hematoma. Disc levels: No acute abnormality. Degenerative changes/ankylosis at C1-2 again noted as well as moderate multilevel facet arthropathy. Mild multilevel degenerative disc disease/spondylosis again identified. Upper chest: No acute abnormality. Other: None IMPRESSION: 1. No evidence of acute intracranial abnormality. Atrophy and chronic small-vessel white matter ischemic changes. 2. No static evidence of acute injury to the cervical spine. Multilevel degenerative changes again noted. Electronically Signed   By: Harmon Pier M.D.   On: 10/22/2022 12:07   CT Cervical Spine Wo Contrast  Result Date: 10/22/2022 CLINICAL DATA:  82 year old female with head and neck pain following fall. Initial encounter. EXAM: CT HEAD WITHOUT CONTRAST CT CERVICAL SPINE WITHOUT CONTRAST TECHNIQUE: Multidetector CT imaging of the head and cervical spine was performed following the standard protocol without intravenous contrast. Multiplanar CT image reconstructions of the cervical spine were also generated. RADIATION DOSE REDUCTION: This exam was performed according to the departmental dose-optimization program which includes automated exposure control, adjustment of the mA and/or kV according to patient size and/or use of iterative reconstruction technique. COMPARISON:  08/15/2021 CTs and prior studies FINDINGS: CT HEAD FINDINGS Brain: No evidence of acute infarction, hemorrhage, hydrocephalus, extra-axial collection or mass lesion/mass effect. Atrophy and chronic small-vessel white matter ischemic changes again noted. Vascular: Carotid and vertebral atherosclerotic calcifications are noted. Skull: Normal. Negative for fracture or focal lesion. Sinuses/Orbits: No acute finding. Other: None. CT CERVICAL SPINE FINDINGS Alignment: Reversal of the normal  cervical lordosis again identified. Minimal anterolisthesis at C3-4, C4-5, C5-6 and at C7-T1 again noted. No new subluxation identified. Skull base and vertebrae: No acute fracture. No primary bone lesion or focal pathologic process. Soft tissues and spinal canal: No prevertebral fluid or swelling. No visible canal hematoma. Disc levels: No acute abnormality. Degenerative changes/ankylosis at C1-2 again noted as well as moderate multilevel facet arthropathy. Mild multilevel degenerative disc disease/spondylosis again identified. Upper chest: No acute abnormality. Other: None IMPRESSION: 1. No evidence of acute intracranial abnormality. Atrophy and chronic small-vessel white matter ischemic changes. 2. No static evidence of acute injury to the cervical spine. Multilevel degenerative changes again noted. Electronically Signed   By: Harmon Pier M.D.   On: 10/22/2022 12:07  DG Ankle 2 Views Left  Result Date: 10/22/2022 CLINICAL DATA:  Fall.  Pain. EXAM: LEFT ANKLE - 2 VIEW COMPARISON:  08/27/2022 FINDINGS: AP and cross-table lateral views. No oblique/mortise view performed. Diffuse soft tissue swelling. Osteopenia. Bone anchor within the calcaneus. Heterogeneity about the posterior calcaneus is likely due to remote trauma and/or surgery. Artifact degradation degrades the lateral view. Presumably from clothing or bedding material. IMPRESSION: Limited two view exam with artifact on the lateral view. Diffuse soft tissue swelling with postsurgical and likely remote posttraumatic changes. No gross acute osseous abnormality. If ongoing clinical concern, recommend repeat with standard three-view series. Electronically Signed   By: Jeronimo Greaves M.D.   On: 10/22/2022 12:06   DG Shoulder Right  Result Date: 10/22/2022 CLINICAL DATA:  Fall.  Pain. EXAM: RIGHT SHOULDER - 2+ VIEW COMPARISON:  None Available. FINDINGS: High-riding humeral head, consistent with chronic rotator cuff insufficiency. No acute fracture or  dislocation. Visualized portion of the right hemithorax is normal. IMPRESSION: No acute osseous abnormality. Electronically Signed   By: Jeronimo Greaves M.D.   On: 10/22/2022 12:03   DG Chest 1 View  Result Date: 10/22/2022 CLINICAL DATA:  Right-sided fall and pain. EXAM: CHEST  1 VIEW COMPARISON:  07/01/2022 FINDINGS: Oblique portable radiograph with multiple wires and leads overlying the chest. Midline trachea. Mild cardiomegaly. No right and no large left pleural effusion. No pneumothorax. No congestive failure. Mild right hemidiaphragm elevation. IMPRESSION: No acute findings.  Mild limitations as detailed above. Cardiomegaly and low lung volumes. Electronically Signed   By: Jeronimo Greaves M.D.   On: 10/22/2022 12:02   VAS [place] LOWER EXTREMITY VENOUS (DVT)  Result Date: 10/01/2022  Lower Venous DVT Study Patient Name:  [facility]  Date of Exam:   09/30/2022 Medical Rec #: [id number]     Accession #:    [id number] Date of Birth: Oct 25, 1940     Patient Gender: F Patient Age:   81 years Exam Location:  [facility] Procedure:      VAS [place] LOWER EXTREMITY VENOUS (DVT) Referring Phys: Ivin Booty ROBINS --------------------------------------------------------------------------------  Indications: Edema, and History of DVT in left femoral vein on 09/29/22. Repeat study for possible IVC filter placement.  Performing Technologist: Marilynne Halsted RDMS, RVT  Examination Guidelines: A complete evaluation includes B-mode imaging, spectral Doppler, color Doppler, and power Doppler as needed of all accessible portions of each vessel. Bilateral testing is considered an integral part of a complete examination. Limited examinations for reoccurring indications may be performed as noted. The reflux portion of the exam is performed with the patient in reverse Trendelenburg.  +-----+---------------+---------+-----------+----------+--------------+ RIGHTCompressibilityPhasicitySpontaneityPropertiesThrombus Aging  +-----+---------------+---------+-----------+----------+--------------+ CFV  Full           Yes      Yes                                 +-----+---------------+---------+-----------+----------+--------------+ SFJ  Full                                                        +-----+---------------+---------+-----------+----------+--------------+   +---------+---------------+---------+-----------+----------+-------------------+ LEFT     CompressibilityPhasicitySpontaneityPropertiesThrombus Aging      +---------+---------------+---------+-----------+----------+-------------------+ CFV      Full           Yes  Yes                                      +---------+---------------+---------+-----------+----------+-------------------+ SFJ      Full                                                             +---------+---------------+---------+-----------+----------+-------------------+ FV Prox  Full                                                             +---------+---------------+---------+-----------+----------+-------------------+ FV Mid   Full                                                             +---------+---------------+---------+-----------+----------+-------------------+ FV DistalFull           Yes      Yes                                      +---------+---------------+---------+-----------+----------+-------------------+ PFV      Full                                                             +---------+---------------+---------+-----------+----------+-------------------+ POP      Full           Yes      Yes                                      +---------+---------------+---------+-----------+----------+-------------------+ PTV      Full                                                             +---------+---------------+---------+-----------+----------+-------------------+ PERO                                                   one of the paired  vein not clearly                                                          visualized          +---------+---------------+---------+-----------+----------+-------------------+ GSV      Partial                                      one of the paired                                                         veins               +---------+---------------+---------+-----------+----------+-------------------+ SSV      None                                         Chronic             +---------+---------------+---------+-----------+----------+-------------------+ LEIV                                                  Patent              +---------+---------------+---------+-----------+----------+-------------------+ LCIV                                                  Patent              +---------+---------------+---------+-----------+----------+-------------------+ Left femoral vein appears patent without evidence of thrombus.    Summary: RIGHT: - No evidence of common femoral vein obstruction.  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  *See table(s) above for measurements and observations. Electronically signed by Heath Lark on 10/01/2022 at 1:45:42 PM.    Final    US Venous Img Lower Bilateral (DVT)  Result Date: 09/29/2022 CLINICAL DATA:  Bilateral lower extremity edema. EXAM: BILATERAL LOWER EXTREMITY VENOUS DOPPLER ULTRASOUND TECHNIQUE: Gray-scale sonography with graded compression, as well as color Doppler and duplex ultrasound were performed to evaluate the lower extremity deep venous systems from the level of the common femoral vein and including the common femoral, femoral, profunda femoral, popliteal and calf  veins including the posterior tibial, peroneal and gastrocnemius veins when visible. The superficial great saphenous vein was also interrogated. Spectral Doppler was utilized to evaluate flow at rest and with distal augmentation maneuvers in the common femoral, femoral and popliteal veins. COMPARISON:  August 24, 2022 FINDINGS: RIGHT LOWER EXTREMITY Common Femoral Vein: No evidence of thrombus. Normal compressibility, respiratory phasicity and response to augmentation. Saphenofemoral Junction: No evidence of thrombus. Normal compressibility and  flow on color Doppler imaging. Profunda Femoral Vein: No evidence of thrombus. Normal compressibility and flow on color Doppler imaging. Femoral Vein: No evidence of thrombus. Normal compressibility, respiratory phasicity and response to augmentation. Popliteal Vein: No evidence of thrombus. Normal compressibility, respiratory phasicity and response to augmentation. Calf Veins: No evidence of thrombus. Normal compressibility and flow on color Doppler imaging. Superficial Great Saphenous Vein: No evidence of thrombus. Normal compressibility. Venous Reflux:  None. Other Findings:  None. LEFT LOWER EXTREMITY Common Femoral Vein: No evidence of thrombus. Normal compressibility, respiratory phasicity and response to augmentation. Saphenofemoral Junction: No evidence of thrombus. Normal compressibility and flow on color Doppler imaging. Profunda Femoral Vein: No evidence of thrombus. Normal compressibility and flow on color Doppler imaging. Femoral Vein: No evidence of nonocclusive thrombus with abnormal compressibility, respiratory phasicity and response to augmentation. Popliteal Vein: No evidence of thrombus. Normal compressibility, respiratory phasicity and response to augmentation. Calf Veins: No evidence of thrombus. Normal compressibility and flow on color Doppler imaging. Superficial Great Saphenous Vein: No evidence of thrombus. Normal compressibility. Venous Reflux:  None.  Other Findings:  None. IMPRESSION: 1. Nonocclusive DVT within the LEFT femoral vein. 2. No evidence of DVT within the RIGHT lower extremity. Electronically Signed   By: Aram Candela M.D.   On: 09/29/2022 20:34     Subjective: Pt agreeable to SNF rehab and will participate in rehab.  Pt says her leg discomfort has been easing.   Discharge Exam: Vitals:   10/26/22 2027 10/27/22 0427  BP: 112/62 128/60  Pulse: 69 70  Resp: 18 18  Temp: 98.7 F (37.1 C) 98 F (36.7 C)  SpO2:  96%   Vitals:   10/26/22 0439 10/26/22 1346 10/26/22 2027 10/27/22 0427  BP: (!) 119/38 (!) 119/59 112/62 128/60  Pulse: 71 71 69 70  Resp: 18 20 18 18   Temp: 98.4 F (36.9 C) 98.5 F (36.9 C) 98.7 F (37.1 C) 98 F (36.7 C)  TempSrc: Oral Oral    SpO2: 94% 100%  96%  Weight:      Height:       General: Pt is alert, awake, not in acute distress Cardiovascular: normal S1/S2 +, no rubs, no gallops Respiratory: CTA bilaterally, no wheezing, no rhonchi Abdominal: Soft, NT, ND, bowel sounds + Extremities: trace edema, no cyanosis   The results of significant diagnostics from this hospitalization (including imaging, microbiology, ancillary and laboratory) are listed below for reference.     Microbiology: Recent Results (from the past 240 hour(s))  SARS Coronavirus 2 by RT PCR (hospital order, performed in [facility] hospital lab) *cepheid single result test* Anterior Nasal Swab     Status: None   Collection Time: 10/26/22 12:39 PM   Specimen: Anterior Nasal Swab  Result Value Ref Range Status   SARS Coronavirus 2 by RT PCR NEGATIVE NEGATIVE Final    Comment: (NOTE) SARS-CoV-2 target nucleic acids are NOT DETECTED.  The SARS-CoV-2 RNA is generally detectable in upper and lower respiratory specimens during the acute phase of infection. The lowest concentration of SARS-CoV-2 viral copies this assay can detect is 250 copies / mL. A negative result does not preclude SARS-CoV-2 infection and should  not be used as the sole basis for treatment or other patient management decisions.  A negative result may occur with improper specimen collection / handling, submission of specimen other than nasopharyngeal swab, presence of viral mutation(s) within the areas targeted by this assay, and inadequate number of viral copies (<250 copies / mL).  A negative result must be combined with clinical observations, patient history, and epidemiological information.  Fact Sheet for Patients:   RoadLapTop.co.za  Fact Sheet for Healthcare Providers: http://kim-miller.com/  This test is not yet approved or  cleared by the [place] FDA and has been authorized for detection and/or diagnosis of SARS-CoV-2 by FDA under an Emergency Use Authorization (EUA).  This EUA will remain in effect (meaning this test can be used) for the duration of the COVID-19 declaration under Section 564(b)(1) of the Act, 21 U.S.C. section 360bbb-3(b)(1), unless the authorization is terminated or revoked sooner.  Performed at [facility], [address], [place], [place] [address]      Labs: BNP (last 3 results) Recent Labs    07/01/22 1131 09/29/22 1710  BNP 72.0 65.8   Basic Metabolic Panel: Recent Labs  Lab 10/22/22 1111 10/23/22 0421 10/26/22 0428  NA 138 138 136  K 3.6 3.1* 3.8  CL 103 103 100  CO2 27 26 28   GLUCOSE 120* 135* 107*  BUN 10 9 13   CREATININE 0.63 0.64 0.65  CALCIUM 8.7* 8.5* 8.4*   Liver Function Tests: No results for input(s): "AST", "ALT", "ALKPHOS", "BILITOT", "PROT", "ALBUMIN" in the last 168 hours. No results for input(s): "LIPASE", "AMYLASE" in the last 168 hours. No results for input(s): "AMMONIA" in the last 168 hours. CBC: Recent Labs  Lab 10/22/22 1111 10/23/22 0421 10/24/22 0450 10/24/22 0715 10/24/22 1550 10/25/22 0211 10/26/22 0428 10/27/22 0407  WBC 8.7 7.0 6.9  --   --  7.5 6.6 7.9  NEUTROABS 6.5  --   --   --   --    --   --   --   HGB 8.2* 7.5* 6.7* 6.5* 8.4* 8.3* 8.2* 8.8*  HCT 28.1* 25.9* 23.1* 22.4* 28.5* 27.1* 27.0* 29.0*  MCV 88.9 89.6 88.2  --   --  87.4 87.4 88.7  PLT 333 308 285  --   --  280 304 314   Cardiac Enzymes: No results for input(s): "CKTOTAL", "CKMB", "CKMBINDEX", "TROPONINI" in the last 168 hours. BNP: Invalid input(s): "POCBNP" CBG: No results for input(s): "GLUCAP" in the last 168 hours. D-Dimer No results for input(s): "DDIMER" in the last 72 hours. Hgb A1c No results for input(s): "HGBA1C" in the last 72 hours. Lipid Profile No results for input(s): "CHOL", "HDL", "LDLCALC", "TRIG", "CHOLHDL", "LDLDIRECT" in the last 72 hours. Thyroid function studies No results for input(s): "TSH", "T4TOTAL", "T3FREE", "THYROIDAB" in the last 72 hours.  Invalid input(s): "FREET3" Anemia work up No results for input(s): "VITAMINB12", "FOLATE", "FERRITIN", "TIBC", "IRON", "RETICCTPCT" in the last 72 hours. Urinalysis    Component Value Date/Time   COLORURINE YELLOW 10/22/2022 1253   APPEARANCEUR CLEAR 10/22/2022 1253   LABSPEC 1.010 10/22/2022 1253   PHURINE 7.0 10/22/2022 1253   GLUCOSEU NEGATIVE 10/22/2022 1253   HGBUR NEGATIVE 10/22/2022 1253   BILIRUBINUR NEGATIVE 10/22/2022 1253   KETONESUR NEGATIVE 10/22/2022 1253   PROTEINUR NEGATIVE 10/22/2022 1253   NITRITE NEGATIVE 10/22/2022 1253   LEUKOCYTESUR NEGATIVE 10/22/2022 1253   Sepsis Labs Recent Labs  Lab 10/24/22 0450 10/25/22 0211 10/26/22 0428 10/27/22 0407  WBC 6.9 7.5 6.6 7.9   Microbiology Recent Results (from the past 240 hour(s))  SARS Coronavirus 2 by RT PCR (hospital order, performed in [facility] hospital lab) *cepheid single result test* Anterior Nasal Swab     Status: None   Collection Time: 10/26/22 12:39 PM   Specimen: Anterior Nasal Swab  Result Value Ref Range  Status   SARS Coronavirus 2 by RT PCR NEGATIVE NEGATIVE Final    Comment: (NOTE) SARS-CoV-2 target nucleic acids are NOT  DETECTED.  The SARS-CoV-2 RNA is generally detectable in upper and lower respiratory specimens during the acute phase of infection. The lowest concentration of SARS-CoV-2 viral copies this assay can detect is 250 copies / mL. A negative result does not preclude SARS-CoV-2 infection and should not be used as the sole basis for treatment or other patient management decisions.  A negative result may occur with improper specimen collection / handling, submission of specimen other than nasopharyngeal swab, presence of viral mutation(s) within the areas targeted by this assay, and inadequate number of viral copies (<250 copies / mL). A negative result must be combined with clinical observations, patient history, and epidemiological information.  Fact Sheet for Patients:   RoadLapTop.co.za  Fact Sheet for Healthcare Providers: http://kim-miller.com/  This test is not yet approved or  cleared by the [place] FDA and has been authorized for detection and/or diagnosis of SARS-CoV-2 by FDA under an Emergency Use Authorization (EUA).  This EUA will remain in effect (meaning this test can be used) for the duration of the COVID-19 declaration under Section 564(b)(1) of the Act, 21 U.S.C. section 360bbb-3(b)(1), unless the authorization is terminated or revoked sooner.  Performed at [facility], [address], [place], [place] [address]     Time coordinating discharge: 43 mins   SIGNED:  Standley Dakins, MD  [facility] 10/27/2022, 10:07 AM How to contact the [facility] Attending or Consulting provider 7A - 7P or covering provider during after hours 7P -7A, for this patient?  Check the care team in [facility] and look for a) attending/consulting [facility] provider listed and b) the [facility] team listed Log into [email or web address] and use [facility]'s universal password to access. If you do not have the password, please contact the hospital operator. Locate the  [facility] provider you are looking for under [facility] and page to a number that you can be directly reached. If you still have difficulty reaching the provider, please page the [facility] (Director on Call) for the Hospitalists listed on amion for assistance.

## 2022-10-28 ENCOUNTER — Encounter: Payer: Self-pay | Admitting: Internal Medicine

## 2022-10-28 ENCOUNTER — Non-Acute Institutional Stay (SKILLED_NURSING_FACILITY): Payer: Medicare Other | Admitting: Internal Medicine

## 2022-10-28 DIAGNOSIS — I824Y2 Acute embolism and thrombosis of unspecified deep veins of left proximal lower extremity: Secondary | ICD-10-CM | POA: Diagnosis not present

## 2022-10-28 DIAGNOSIS — R29818 Other symptoms and signs involving the nervous system: Secondary | ICD-10-CM | POA: Diagnosis not present

## 2022-10-28 DIAGNOSIS — R4189 Other symptoms and signs involving cognitive functions and awareness: Secondary | ICD-10-CM

## 2022-10-28 DIAGNOSIS — D62 Acute posthemorrhagic anemia: Secondary | ICD-10-CM

## 2022-10-28 NOTE — Assessment & Plan Note (Addendum)
Continue Eliquis & monitor for exacerbation of GI bleeding. Coagulopathy workup (Leiden factor V, protein C, protein S) if not done previously once Eliquis can be held.

## 2022-10-28 NOTE — Assessment & Plan Note (Addendum)
See 10/28/2022 she had limited understanding of hospital events and diagnoses.  She was unaware she had a PTE.  She exhibited la belle indiffrence in reference to the severe anemia which required transfusion. Clinically she seems to be exhibiting some confabulation.  She stated that she did not pursue the follow-up EGD "because it was my birthday and I had friends coming."

## 2022-10-28 NOTE — Assessment & Plan Note (Addendum)
Heme positive stool being evaluated by [facility] Incomplete EGD due to poor prep 5/18-5/23/24 inpatient for LLE DVT. H/H @ admission 8.2/28.1, nadir 6.5/23.1 . H/H 8.8/29.0 post 1 u PRBC. GI follow up 6/3.

## 2022-10-28 NOTE — Patient Instructions (Signed)
See assessment and plan under each diagnosis in the problem list and acutely for this visit 

## 2022-10-28 NOTE — Progress Notes (Signed)
NURSING HOME LOCATION:  [facility] ROOM NUMBER:  [id number]  CODE STATUS:  Full Code  PCP:  [name] MD  This is a comprehensive admission note to this SNFperformed on this date less than 30 days from date of admission. Included are preadmission medical/surgical history; reconciled medication list; family history; social history and comprehensive review of systems.  Corrections and additions to the records were documented. Comprehensive physical exam was also performed. Additionally a clinical summary was entered for each active diagnosis pertinent to this admission in the Problem List to enhance continuity of care.  HPI:She was hospitalized 5/18 - 10/27/2022 with new acute LLE DVT.  This was in the context of recent left Achilles tendon injury for which she was wearing a boot and remote history of LLE DVTs. LLE venous Doppler revealed new acute deep venous thrombosis in the left popliteal, posterior tibial, and left peroneal veins, not present on Doppler completed 09/29/2022. Her brother also has recurrent DVTs by history. She has had heme positive stools recently in the context of GERD which is being evaluated by GI.  EGD was incomplete on 4/27 due to poor prep.  Patient declined further endoluminal evaluation.  She was on Protonix twice daily.  GI follow-up was scheduled for 6/3 as an outpatient.   Risk versus benefit of full anticoagulation was discussed and Eliquis initiated 5/22.  On 5/20 IVC  Filter was placed by IR. Nadir hemoglobin/hematocrit  was 6.5/23.1; 1 unit of PRBCs was transfused on 5/20.  At  admission  H/H  was 8.2/28.1 & at discharge H/H 8.8/29.0. PT/OT consulted and recommended SNF placement for rehab because of recurrent falls.  Prior to admission patient had lived alone and ambulated with a walker.  She also was wearing a boot on the LLE due to the recent Achilles tendon injury as noted above.  Past medical and surgical history: Includes history of colon  polyps, diverticulosis, fibromyalgia, GERD, history of IBS, history of nephrolithiasis, hypothyroidism, Surgeries and procedures include cholecystectomy, shockwave lithotripsy, EGD, and colonoscopy.  Social history: Non-smoker, nondrinker.  Family history: A brother apparently has had recurrent DVTs as well.  Otherwise family history is noncontributory due to advanced age.   Review of systems: Clinical neurocognitive deficits made validity of responses questionable.  Initially she could not define the events of the hospitalization , stating "it was a wild."  She subsequently stated that she had "1 clot in the leg they removed."  She had no realization about having had multiple PTE.   She confabulated that she had not seen the GI specialist "in a long time."  She denies dysphagia but describes constipation.  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  She also describes "sinus" associated with "two sounds in my ears."  Apparently she is describing some tinnitus.  She denies any purulent nasal discharge.  She describes swelling under the left great toe.  Constitutional: No fever, significant weight change  Eyes: No redness, discharge, pain, vision change Cardiovascular: No chest pain, palpitations, paroxysmal nocturnal dyspnea, claudication, edema  Respiratory: No cough, sputum  production, hemoptysis, DOE Gastrointestinal: No heartburn,  abdominal pain, nausea /vomiting, rectal bleeding, melena Genitourinary: No dysuria, hematuria, pyuria, incontinence, nocturia Musculoskeletal: No joint stiffness, joint swelling, weakness,  pain Dermatologic: No rash, pruritus, change in appearance of skin Neurologic: No dizziness, headache, syncope, seizures, numbness, tingling Psychiatric: No significant anxiety, depression, insomnia, anorexia Endocrine: No change in hair/skin/nails, excessive thirst, excessive hunger, excessive urination  Hematologic/lymphatic: No significant bruising, lymphadenopathy, abnormal bleeding Allergy/immunology: No itchy/watery eyes, significant sneezing, urticaria, angioedema  Physical exam:  Pertinent or positive findings: She appears her age and somewhat chronically ill.  Hair is thin.  Facies are blank.  Her voice is weak.  The cheeks are sunken.  Eyebrows are decreased in density.  The lower lids are puffy, especially on the left.  The left nasolabial fold is decreased.  Dorsalis pedis pulses are stronger than posterior tibial pulses.  She has trace edema at the right sock line and 1/2+ at the left sock line.  Interosseous wasting is present.  There is a tremor of the left hand.  Keratoses are present at the base of the left great toe on the sole.  Bruising is present over the right dorsal wrist.  General appearance: no acute distress, increased work of breathing is present.   Lymphatic: No lymphadenopathy about the head, neck, axilla. Eyes: No conjunctival inflammation or lid edema is present. There is no scleral icterus. Ears:  External ear exam shows no significant lesions or deformities.   Nose:  External nasal examination shows no deformity or inflammation. Nasal mucosa are pink and moist without lesions, exudates Oral exam: Lips and gums are healthy appearing.There is no oropharyngeal erythema or exudate. Neck:  No thyromegaly, masses, tenderness noted.    Heart:  Normal rate and regular rhythm. S1 and S2 normal without gallop, murmur, click, rub.  Lungs: without wheezes, rhonchi, rales, rubs. Abdomen: Bowel sounds are normal.  Abdomen is soft and nontender with no organomegaly, hernias,  masses. GU: Deferred  Extremities:  No cyanosis, clubbing Neurologic exam: Balance, Rhomberg, finger to nose testing could not be completed due to clinical state Skin: Warm & dry w/o tenting. No significant rash.  See clinical summary under each active problem in the Problem List with associated updated therapeutic plan

## 2022-10-31 ENCOUNTER — Other Ambulatory Visit (HOSPITAL_COMMUNITY)
Admission: RE | Admit: 2022-10-31 | Discharge: 2022-10-31 | Disposition: A | Discharge status: Home / Self Care | Payer: Medicare Other | Source: Skilled Nursing Facility | Attending: Adult Health | Admitting: Adult Health

## 2022-10-31 DIAGNOSIS — I824Z2 Acute embolism and thrombosis of unspecified deep veins of left distal lower extremity: Secondary | ICD-10-CM | POA: Insufficient documentation

## 2022-10-31 LAB — CBC
HCT: 26.9 % — ABNORMAL LOW (ref 36.0–46.0)
Hemoglobin: 8.2 g/dL — ABNORMAL LOW (ref 12.0–15.0)
MCH: 26.6 pg (ref 26.0–34.0)
MCHC: 30.5 g/dL (ref 30.0–36.0)
MCV: 87.3 fL (ref 80.0–100.0)
Platelets: 311 10*3/uL (ref 150–400)
RBC: 3.08 MIL/uL — ABNORMAL LOW (ref 3.87–5.11)
RDW: 16 % — ABNORMAL HIGH (ref 11.5–15.5)
WBC: 6.6 10*3/uL (ref 4.0–10.5)
nRBC: 0 % (ref 0.0–0.2)

## 2022-10-31 LAB — BASIC METABOLIC PANEL
Anion gap: 11 (ref 5–15)
BUN: 18 mg/dL (ref 8–23)
CO2: 24 mmol/L (ref 22–32)
Calcium: 8.5 mg/dL — ABNORMAL LOW (ref 8.9–10.3)
Chloride: 99 mmol/L (ref 98–111)
Creatinine, Ser: 0.6 mg/dL (ref 0.44–1.00)
GFR, Estimated: >60 mL/min (ref >60)
Glucose, Bld: 108 mg/dL — ABNORMAL HIGH (ref 70–99)
Potassium: 3.7 mmol/L (ref 3.5–5.1)
Sodium: 134 mmol/L — ABNORMAL LOW (ref 135–145)

## 2022-11-01 DIAGNOSIS — E559 Vitamin D deficiency, unspecified: Secondary | ICD-10-CM | POA: Insufficient documentation

## 2022-11-01 DIAGNOSIS — G319 Degenerative disease of nervous system, unspecified: Secondary | ICD-10-CM | POA: Insufficient documentation

## 2022-11-01 DIAGNOSIS — D649 Anemia, unspecified: Secondary | ICD-10-CM | POA: Insufficient documentation

## 2022-11-01 DIAGNOSIS — E876 Hypokalemia: Secondary | ICD-10-CM | POA: Insufficient documentation

## 2022-11-03 ENCOUNTER — Other Ambulatory Visit (HOSPITAL_COMMUNITY)
Admission: RE | Admit: 2022-11-03 | Discharge: 2022-11-03 | Disposition: A | Discharge status: Home / Self Care | Payer: Medicare Other | Source: Skilled Nursing Facility | Attending: Adult Health | Admitting: Adult Health

## 2022-11-03 DIAGNOSIS — D649 Anemia, unspecified: Secondary | ICD-10-CM | POA: Insufficient documentation

## 2022-11-03 LAB — HEMOGLOBIN AND HEMATOCRIT, BLOOD
HCT: 27.4 % — ABNORMAL LOW (ref 36.0–46.0)
Hemoglobin: 8.3 g/dL — ABNORMAL LOW (ref 12.0–15.0)

## 2022-11-07 ENCOUNTER — Encounter: Payer: Self-pay | Admitting: Physician Assistant

## 2022-11-07 ENCOUNTER — Ambulatory Visit (INDEPENDENT_AMBULATORY_CARE_PROVIDER_SITE_OTHER): Payer: Medicare Other | Admitting: Physician Assistant

## 2022-11-07 ENCOUNTER — Other Ambulatory Visit (INDEPENDENT_AMBULATORY_CARE_PROVIDER_SITE_OTHER): Payer: Medicare Other

## 2022-11-07 VITALS — BP 138/80 | HR 70 | Ht 64.0 in

## 2022-11-07 DIAGNOSIS — D509 Iron deficiency anemia, unspecified: Secondary | ICD-10-CM

## 2022-11-07 DIAGNOSIS — R195 Other fecal abnormalities: Secondary | ICD-10-CM

## 2022-11-07 LAB — IBC + FERRITIN
Ferritin: 12.4 ng/mL (ref 10.0–291.0)
Iron: 19 ug/dL — ABNORMAL LOW (ref 42–145)
Saturation Ratios: 4.3 % — ABNORMAL LOW (ref 20.0–50.0)
TIBC: 436.8 ug/dL (ref 250.0–450.0)
Transferrin: 312 mg/dL (ref 212.0–360.0)

## 2022-11-07 LAB — HEMOGLOBIN: Hemoglobin: 8.9 g/dL — ABNORMAL LOW (ref 12.0–15.0)

## 2022-11-07 NOTE — Progress Notes (Signed)
Chief Complaint: Follow-up hospitalization for anemia with heme positive stool  HPI:    Stefanie [name] is an 82 year old female with a past medical history as listed below including reflux, fibromyalgia and multiple others, who presents to clinic today for follow-up after hospitalization for anemia with heme positive stool.    09/30/2022 patient consulted by our service for heme positive anemia.  At that time recommended EGD.    10/01/2022 EGD with normal esophagus and a large amount of food residue in the stomach, medium sized hiatal hernia, gastritis and retained food in the duodenum.  Patient did not want to stay for further evaluation.    10/22/2022-10/27/2022 patient admitted to the hospital with new acute left lower extremity DVT.  Placed on Eliquis.  Also underwent IVC placement.  Recommended a full 6-12 weeks of anticoagulation before stopping therapy for any reason.  At that time noted her reflux and discussed incomplete EGD/27/27 due to poor prep and she declined further endoluminal evaluation was continued on Captomex twice daily.  Apparently hemoglobin did drop to 6.5 and she was transfused 1 unit on 10/24/2022.  Hemoglobin remained stable around 8.2.    11/03/2022 CBC with hemoglobin of 8.3 (stable over the past 2 weeks).    Today, patient presents to clinic accompanied by her granddaughter.  The patient is still residing in rehab but is supposed to leave there this weekend.  Her granddaughter Fannie Knee helps take care of her.  In general her granddaughter says she has been doing much better while at the rehab and getting PT.  She is not having any GI symptoms and feeling better overall.  Her last blood draw was as above.  Denies seeing any GI bleeding.  Continues on Pantoprazole 40 twice daily.    Denies fever, chills, weight loss, abdominal pain, heartburn or reflux.  Past Medical History:  Diagnosis Date   Anxiety    Arthritis    Colon polyps    Depression    Diverticulosis of colon (without  mention of hemorrhage)    Fibromyalgia    GERD (gastroesophageal reflux disease)    Hiatal hernia    History of IBS    History of kidney stones    Hypothyroidism     Past Surgical History:  Procedure Laterality Date   ABDOMINAL HYSTERECTOMY     ANKLE SURGERY     bilateral    APPENDECTOMY  1985   BREAST BIOPSY Left    Benign    CHOLECYSTECTOMY  1985   ESOPHAGEAL MANOMETRY N/A 10/22/2012   Procedure: ESOPHAGEAL MANOMETRY (EM);  Surgeon: Mardella Layman, MD;  Location: [facility];  Service: Endoscopy;  Laterality: N/A;   ESOPHAGOGASTRODUODENOSCOPY N/A 10/01/2022   Procedure: ESOPHAGOGASTRODUODENOSCOPY (EGD);  Surgeon: [name], MD;  Location: [facility] ENDOSCOPY;  Service: Gastroenterology;  Laterality: N/A;   EXTRACORPOREAL SHOCK WAVE LITHOTRIPSY Left 11/26/2018   Procedure: EXTRACORPOREAL SHOCK WAVE LITHOTRIPSY (ESWL);  Surgeon: Marcine Matar, MD;  Location: [facility];  Service: Urology;  Laterality: Left;   HEMORRHOID SURGERY     IR IVC FILTER PLMT / S&I /IMG GUID/MOD SED  10/24/2022   RECTOCELE REPAIR     TUBAL LIGATION      Current Outpatient Medications  Medication Sig Dispense Refill   acetaminophen (TYLENOL) 500 MG tablet Take 2 tablets (1,000 mg total) by mouth every 6 (six) hours as needed for moderate pain. 30 tablet 0   apixaban (ELIQUIS) 5 MG TABS tablet 2 po BID thru 11/01/22 then 1 po BID 60 tablet  escitalopram (LEXAPRO) 20 MG tablet Take 20 mg by mouth daily.     fexofenadine (ALLEGRA) 60 MG tablet Take 1 tablet (60 mg total) by mouth 2 (two) times daily as needed for allergies or rhinitis.     levothyroxine (SYNTHROID, LEVOTHROID) 75 MCG tablet Take 75 mcg by mouth at bedtime.     pantoprazole (PROTONIX) 40 MG tablet Take 1 tablet (40 mg total) by mouth 2 (two) times daily. 60 tablet 3   polyethylene glycol (MIRALAX / GLYCOLAX) 17 g packet Take 17 g by mouth daily. 14 each 0   potassium chloride (KLOR-CON) 10 MEQ tablet Take 10 mEq by mouth daily.      Vitamin D, Ergocalciferol, (DRISDOL) 1.25 MG (50000 UNIT) CAPS capsule Take 50,000 Units by mouth every 7 (seven) days.     No current facility-administered medications for this visit.    Allergies as of 11/07/2022 - Review Complete 10/28/2022  Allergen Reaction Noted   Gabapentin  04/30/2021   Lidocaine Rash 08/26/2015   Metoclopramide Rash 08/26/2015   Other Other (See Comments) 11/26/2018   Sulfa antibiotics Rash 08/26/2015   Z-pak [azithromycin] Rash 04/08/2009   Metoclopramide hcl Other (See Comments) 04/08/2009   Penicillins Other (See Comments) 04/08/2009   Procaine hcl Other (See Comments) 04/08/2009   Macrodantin [nitrofurantoin macrocrystal] Rash 11/21/2018    Family History  Problem Relation Age of Onset   Colon polyps Brother    Breast cancer Neg Hx     Social History   Socioeconomic History   Marital status: Widowed    Spouse name: Not on file   Number of children: 1   Years of education: Not on file   Highest education level: Not on file  Occupational History   Occupation: retired    Associate Professor: RETIRED  Tobacco Use   Smoking status: Never   Smokeless tobacco: Never  Vaping Use   Vaping Use: Never used  Substance and Sexual Activity   Alcohol use: No    Alcohol/week: 0.0 standard drinks of alcohol   Drug use: No   Sexual activity: Not on file  Other Topics Concern   Not on file  Social History Narrative   Not on file   Social Determinants of Health   Financial Resource Strain: Not on file  Food Insecurity: Food Insecurity Present (10/22/2022)   Hunger Vital Sign    Worried About Running Out of Food in the Last Year: Sometimes true    Ran Out of Food in the Last Year: Sometimes true  Transportation Needs: No Transportation Needs (10/22/2022)   PRAPARE - Administrator, Civil Service (Medical): No    Lack of Transportation (Non-Medical): No  Physical Activity: Not on file  Stress: Not on file  Social Connections: Not on file   Intimate Partner Violence: Not At Risk (10/22/2022)   Humiliation, Afraid, Rape, and Kick questionnaire    Fear of Current or Ex-Partner: No    Emotionally Abused: No    Physically Abused: No    Sexually Abused: No    Review of Systems:    Constitutional: No weight loss, fever or chills Cardiovascular: No chest pain Respiratory: No SOB Gastrointestinal: See HPI and otherwise negative   Physical Exam:  Vital signs: BP 138/80   Pulse 70   Ht 5\' 4"  (1.626 m)   BMI 25.13 kg/m    Constitutional:   Pleasant Elderly Caucasian female appears to be in NAD, Well developed, Well nourished, alert and cooperative Respiratory: Respirations even and  unlabored. Lungs clear to auscultation bilaterally.   No wheezes, crackles, or rhonchi.  Cardiovascular: Normal S1, S2. No MRG. Regular rate and rhythm. No peripheral edema, cyanosis or pallor.  Gastrointestinal:  Soft, nondistended, nontender. No rebound or guarding. Normal bowel sounds. No appreciable masses or hepatomegaly. Psychiatric: Demonstrates good judgement and reason without abnormal affect or behaviors.  RELEVANT LABS AND IMAGING: CBC    Component Value Date/Time   WBC 6.6 10/31/2022 0800   RBC 3.08 (L) 10/31/2022 0800   HGB 8.3 (L) 11/03/2022 0800   HCT 27.4 (L) 11/03/2022 0800   PLT 311 10/31/2022 0800   MCV 87.3 10/31/2022 0800   MCH 26.6 10/31/2022 0800   MCHC 30.5 10/31/2022 0800   RDW 16.0 (H) 10/31/2022 0800   LYMPHSABS 1.2 10/22/2022 1111   MONOABS 0.7 10/22/2022 1111   EOSABS 0.1 10/22/2022 1111   BASOSABS 0.1 10/22/2022 1111    CMP     Component Value Date/Time   NA 134 (L) 10/31/2022 0800   K 3.7 10/31/2022 0800   CL 99 10/31/2022 0800   CO2 24 10/31/2022 0800   GLUCOSE 108 (H) 10/31/2022 0800   BUN 18 10/31/2022 0800   CREATININE 0.60 10/31/2022 0800   CALCIUM 8.5 (L) 10/31/2022 0800   PROT 6.2 (L) 09/30/2022 0900   ALBUMIN 3.1 (L) 09/30/2022 0900   AST 14 (L) 09/30/2022 0900   ALT 10 09/30/2022 0900    ALKPHOS 105 09/30/2022 0900   BILITOT 0.6 09/30/2022 0900   GFRNONAA >60 10/31/2022 0800    Assessment: 1.  Iron deficiency anemia with Hemoccult positive stools: Hemoglobin currently stable over the past 2 weeks, patient has new DVT end of May requiring Eliquis for 12 weeks straight per their notes, previous EGD incomplete due to food in the stomach, patient previously declined colonoscopy; referred patient to hematology, if patient is agreeable to EGD and colonoscopy could consider these in the next 3 to 4 months or sooner if signs of acute bleeding and necessity demands  Plan: 1.  Recheck hemoglobin and iron studies with ferritin today. 2.  Referred the patient to the hematology clinic given that she is on Eliquis and cannot stop this at all over the next 12 weeks.  Someone needs to monitor her iron deficiency anemia and hopefully they can complete their side of workup.  When she is able to hold Eliquis we could consider EGD and colonoscopy if she is willing and able and this is recommended still.  Meanwhile if her hemoglobin remained stable then we may not need to do these things.  May just require iron supplementation.  Previously patient declined EGD and colonoscopy.  Would need extended prep as previous EGD was incomplete. 3.  Patient to continue Pantoprazole 40 mg twice daily 4.  Patient to follow in clinic with [place] per recommendations from hematology.  She was assigned to Dr. Adela Lank this afternoon.  Hyacinth Meeker, PA-C [facility] 11/07/2022, 1:30 PM  Cc: [name], MD

## 2022-11-07 NOTE — Progress Notes (Signed)
Stefanie [name] this patient is established with Dr. Rhea Belton, can you please send note to him. Thanks

## 2022-11-07 NOTE — Patient Instructions (Signed)
Your provider has requested that you go to the basement level for lab work before leaving today. Press "B" on the elevator. The lab is located at the first door on the left as you exit the elevator.  We are referring you to Hematology at [facility]. They will call you directly with an appointment date and time.  The [place] providers would like to encourage you to use [facility] to communicate with providers for non-urgent requests or questions.  Due to long hold times on the telephone, sending your provider a message by [facility] may be a faster and more efficient way to get a response.  Please allow 48 business hours for a response.  Please remember that this is for non-urgent requests.

## 2022-11-09 NOTE — Progress Notes (Signed)
Addendum: Reviewed and agree with assessment and management plan. Vicy Medico M, MD  

## 2022-11-11 ENCOUNTER — Non-Acute Institutional Stay (SKILLED_NURSING_FACILITY): Payer: Medicare Other | Admitting: Adult Health

## 2022-11-11 ENCOUNTER — Encounter: Payer: Self-pay | Admitting: Adult Health

## 2022-11-11 DIAGNOSIS — I824Y2 Acute embolism and thrombosis of unspecified deep veins of left proximal lower extremity: Secondary | ICD-10-CM | POA: Diagnosis not present

## 2022-11-11 DIAGNOSIS — G319 Degenerative disease of nervous system, unspecified: Secondary | ICD-10-CM

## 2022-11-11 DIAGNOSIS — E039 Hypothyroidism, unspecified: Secondary | ICD-10-CM | POA: Diagnosis not present

## 2022-11-11 NOTE — Progress Notes (Unsigned)
Location:  [facility] Room Number: 133P Place of Service:  SNF (31)   CODE STATUS: Full Code  Allergies  Allergen Reactions   Gabapentin      Dizziness. States made her feel like she was drunk. Unsteady gait and dizziness.         Lidocaine Rash   Metoclopramide Rash   Other Other (See Comments)    DYE+Nitrofurantoin+Brilliant Blue Fcf   Sulfa Antibiotics Rash   Z-Pak [Azithromycin] Rash   Metoclopramide Hcl Other (See Comments)    REACTION: a drawing of all her muscles   Penicillins Other (See Comments)    Unknown    Procaine Hcl Other (See Comments)    Unknown    Macrodantin [Nitrofurantoin Macrocrystal] Rash    Chief Complaint  Patient presents with   Acute Visit    Care plan meeting    HPI:  We have come together for her care plan meeting. Family present. BIMS 12/15 SLUMS 7/30; mood: nervous at times and some depression. Nonambulatory with no falls since admission. She requires max to dependent assist with her adls. She is frequently incontinent of bladder and bowel. Dietary: set up for meals regular diet appetite 50-100%; weight is 151 pounds. Therapy: min assist with transfers/ supervision with sit stand; upper body min assist; lower body mod assist brp: mod assist; ambulate 100 feet with walker with min assist ST: cognition. 50% with med management. Activities: does participate. She will need 24 hour care at home.  She continues to be followed for her chronic illnesses including: Acute deep vein thrombosis (DVT) of proximal vein of left lower extremity  Cerebral atrophy  Acquired hypothyroidism  Past Medical History:  Diagnosis Date   Anxiety    Arthritis    Colon polyps    Depression    Diverticulosis of colon (without mention of hemorrhage)    Fibromyalgia    GERD (gastroesophageal reflux disease)    Hiatal hernia    History of IBS    History of kidney stones    Hypothyroidism     Past Surgical History:  Procedure Laterality  Date   ABDOMINAL HYSTERECTOMY     ANKLE SURGERY     bilateral    APPENDECTOMY  1985   BREAST BIOPSY Left    Benign    CHOLECYSTECTOMY  1985   ESOPHAGEAL MANOMETRY N/A 10/22/2012   Procedure: ESOPHAGEAL MANOMETRY (EM);  Surgeon: Mardella Layman, MD;  Location: [facility];  Service: Endoscopy;  Laterality: N/A;   ESOPHAGOGASTRODUODENOSCOPY N/A 10/01/2022   Procedure: ESOPHAGOGASTRODUODENOSCOPY (EGD);  Surgeon: [name], MD;  Location: [facility] ENDOSCOPY;  Service: Gastroenterology;  Laterality: N/A;   EXTRACORPOREAL SHOCK WAVE LITHOTRIPSY Left 11/26/2018   Procedure: EXTRACORPOREAL SHOCK WAVE LITHOTRIPSY (ESWL);  Surgeon: Marcine Matar, MD;  Location: [facility];  Service: Urology;  Laterality: Left;   HEMORRHOID SURGERY     IR IVC FILTER PLMT / S&I /IMG GUID/MOD SED  10/24/2022   RECTOCELE REPAIR     TUBAL LIGATION      Social History   Socioeconomic History   Marital status: Widowed    Spouse name: Not on file   Number of children: 1   Years of education: Not on file   Highest education level: Not on file  Occupational History   Occupation: retired    Associate Professor: RETIRED  Tobacco Use   Smoking status: Never   Smokeless tobacco: Never  Vaping Use   Vaping Use: Never used  Substance and Sexual Activity  Alcohol use: No    Alcohol/week: 0.0 standard drinks of alcohol   Drug use: No   Sexual activity: Not on file  Other Topics Concern   Not on file  Social History Narrative   Not on file   Social Determinants of Health   Financial Resource Strain: Not on file  Food Insecurity: Food Insecurity Present (10/22/2022)   Hunger Vital Sign    Worried About Running Out of Food in the Last Year: Sometimes true    Ran Out of Food in the Last Year: Sometimes true  Transportation Needs: No Transportation Needs (10/22/2022)   PRAPARE - Administrator, Civil Service (Medical): No    Lack of Transportation (Non-Medical): No  Physical Activity: Not on file  Stress:  Not on file  Social Connections: Not on file  Intimate Partner Violence: Not At Risk (10/22/2022)   Humiliation, Afraid, Rape, and Kick questionnaire    Fear of Current or Ex-Partner: No    Emotionally Abused: No    Physically Abused: No    Sexually Abused: No   Family History  Problem Relation Age of Onset   Colon polyps Brother    Breast cancer Neg Hx       VITAL SIGNS BP 130/60   Pulse 75   Temp 97.6 F (36.4 C)   Resp 20   Ht 5\' 4"  (1.626 m)   Wt 151 lb 6.4 oz (68.7 kg)   SpO2 96%   BMI 25.99 kg/m   Outpatient Encounter Medications as of 11/11/2022  Medication Sig   acetaminophen (TYLENOL) 500 MG tablet Take 2 tablets (1,000 mg total) by mouth every 6 (six) hours as needed for moderate pain.   apixaban (ELIQUIS) 5 MG TABS tablet 2 po BID thru 11/01/22 then 1 po BID   escitalopram (LEXAPRO) 20 MG tablet Take 20 mg by mouth daily.   fexofenadine (ALLEGRA) 60 MG tablet Take 1 tablet (60 mg total) by mouth 2 (two) times daily as needed for allergies or rhinitis.   levothyroxine (SYNTHROID, LEVOTHROID) 75 MCG tablet Take 75 mcg by mouth at bedtime.   pantoprazole (PROTONIX) 40 MG tablet Take 1 tablet (40 mg total) by mouth 2 (two) times daily.   polyethylene glycol (MIRALAX / GLYCOLAX) 17 g packet Take 17 g by mouth daily.   potassium chloride (KLOR-CON) 10 MEQ tablet Take 10 mEq by mouth daily.   Vitamin D, Ergocalciferol, (DRISDOL) 1.25 MG (50000 UNIT) CAPS capsule Take 50,000 Units by mouth every 7 (seven) days.   No facility-administered encounter medications on file as of 11/11/2022.     SIGNIFICANT DIAGNOSTIC EXAMS  TODAY  10-22-22: ct of head and cervical spine 1. No evidence of acute intracranial abnormality. Atrophy and chronic small-vessel white matter ischemic changes. 2. No static evidence of acute injury to the cervical spine. Multilevel degenerative changes again noted.  10-22-22: left lower extremity venous doppler:  New acute occlusive deep venous thrombus  seen in the left popliteal, posterior tibial, and peroneal veins.   LABS REVIEWED:   10-22-22: wbc 8.7; hgb 8.2; hct 28.1 mcv 88.9 plt 333; glucose 120; bun 10; creat 0.63; k+ 3.6; na++ 138; ca 8.7; gfr >60 10-24-22: wbc 6.9; hgb 6.7; hct 23.1; mcv 88.2 plt 285;  10-25-22: wbc 7.5; hgb 8.3; hct 27.1; mcv 87.4 plt 280 10-26-22: glucose 107; bun 13; creat 0.65; k+ 3.8; na++ 136; ca 8.4 gfr >60 10-27-22: wbc 7.9; hgb 8.8; hct 29.0; mcv 88.7 plt 314   Review of  Systems  Constitutional:  Negative for malaise/fatigue.  Respiratory:  Negative for cough and shortness of breath.   Cardiovascular:  Negative for chest pain, palpitations and leg swelling.  Gastrointestinal:  Negative for abdominal pain, constipation and heartburn.  Musculoskeletal:  Negative for back pain, joint pain and myalgias.  Skin: Negative.   Neurological:  Negative for dizziness.  Psychiatric/Behavioral:  The patient is not nervous/anxious.    Physical Exam Constitutional:      General: She is not in acute distress.    Appearance: She is well-developed. She is not diaphoretic.  Neck:     Thyroid: No thyromegaly.  Cardiovascular:     Rate and Rhythm: Normal rate and regular rhythm.     Pulses: Normal pulses.     Heart sounds: Normal heart sounds.  Pulmonary:     Effort: Pulmonary effort is normal. No respiratory distress.     Breath sounds: Normal breath sounds.  Abdominal:     General: Bowel sounds are normal. There is no distension.     Palpations: Abdomen is soft.     Tenderness: There is no abdominal tenderness.  Musculoskeletal:        General: Normal range of motion.     Cervical back: Neck supple.     Right lower leg: No edema.     Left lower leg: No edema.  Lymphadenopathy:     Cervical: No cervical adenopathy.  Skin:    General: Skin is warm and dry.  Neurological:     Mental Status: She is alert. Mental status is at baseline.  Psychiatric:        Mood and Affect: Mood normal.      ASSESSMENT/  PLAN:  TODAY  Acute deep vein thrombosis (DVT) of proximal vein of left lower extremity Cerebral atrophy Acquired hypothyroidism  Will continue current medications Will continue therapy as directed  Will monitor her status. Goals of care: consider assisted living upon discharge.    Time spent with patient: 40 minutes: medications; therapy goals of care    Synthia Innocent NP [facility]  call [phone]

## 2022-11-14 ENCOUNTER — Other Ambulatory Visit (HOSPITAL_COMMUNITY)
Admission: RE | Admit: 2022-11-14 | Discharge: 2022-11-14 | Disposition: A | Discharge status: Home / Self Care | Payer: Medicare Other | Attending: Adult Health | Admitting: Adult Health

## 2022-11-14 DIAGNOSIS — R946 Abnormal results of thyroid function studies: Secondary | ICD-10-CM | POA: Diagnosis present

## 2022-11-14 LAB — TSH: TSH: 4.138 u[IU]/mL (ref 0.350–4.500)

## 2022-11-15 ENCOUNTER — Encounter: Payer: Self-pay | Admitting: Physician Assistant

## 2022-11-15 ENCOUNTER — Inpatient Hospital Stay: Payer: Medicare Other | Attending: Hematology | Admitting: Hematology

## 2022-11-15 VITALS — BP 139/58 | HR 78 | Temp 99.1°F | Resp 16

## 2022-11-15 DIAGNOSIS — I82452 Acute embolism and thrombosis of left peroneal vein: Secondary | ICD-10-CM | POA: Diagnosis present

## 2022-11-15 DIAGNOSIS — D500 Iron deficiency anemia secondary to blood loss (chronic): Secondary | ICD-10-CM

## 2022-11-15 DIAGNOSIS — Z7901 Long term (current) use of anticoagulants: Secondary | ICD-10-CM | POA: Insufficient documentation

## 2022-11-15 DIAGNOSIS — F419 Anxiety disorder, unspecified: Secondary | ICD-10-CM | POA: Diagnosis not present

## 2022-11-15 DIAGNOSIS — Z79899 Other long term (current) drug therapy: Secondary | ICD-10-CM | POA: Diagnosis not present

## 2022-11-15 DIAGNOSIS — K589 Irritable bowel syndrome without diarrhea: Secondary | ICD-10-CM | POA: Diagnosis not present

## 2022-11-15 DIAGNOSIS — F32A Depression, unspecified: Secondary | ICD-10-CM | POA: Insufficient documentation

## 2022-11-15 DIAGNOSIS — R5383 Other fatigue: Secondary | ICD-10-CM | POA: Diagnosis not present

## 2022-11-15 DIAGNOSIS — E669 Obesity, unspecified: Secondary | ICD-10-CM | POA: Diagnosis not present

## 2022-11-15 DIAGNOSIS — E039 Hypothyroidism, unspecified: Secondary | ICD-10-CM | POA: Diagnosis not present

## 2022-11-15 DIAGNOSIS — M2550 Pain in unspecified joint: Secondary | ICD-10-CM | POA: Diagnosis not present

## 2022-11-15 DIAGNOSIS — Z83719 Family history of colon polyps, unspecified: Secondary | ICD-10-CM | POA: Insufficient documentation

## 2022-11-15 DIAGNOSIS — I82432 Acute embolism and thrombosis of left popliteal vein: Secondary | ICD-10-CM | POA: Diagnosis present

## 2022-11-15 DIAGNOSIS — K219 Gastro-esophageal reflux disease without esophagitis: Secondary | ICD-10-CM | POA: Insufficient documentation

## 2022-11-15 DIAGNOSIS — D509 Iron deficiency anemia, unspecified: Secondary | ICD-10-CM | POA: Diagnosis not present

## 2022-11-15 DIAGNOSIS — I82442 Acute embolism and thrombosis of left tibial vein: Secondary | ICD-10-CM | POA: Insufficient documentation

## 2022-11-15 DIAGNOSIS — M797 Fibromyalgia: Secondary | ICD-10-CM | POA: Diagnosis not present

## 2022-11-15 DIAGNOSIS — I824Y2 Acute embolism and thrombosis of unspecified deep veins of left proximal lower extremity: Secondary | ICD-10-CM

## 2022-11-15 DIAGNOSIS — M1990 Unspecified osteoarthritis, unspecified site: Secondary | ICD-10-CM | POA: Diagnosis not present

## 2022-11-15 HISTORY — DX: Iron deficiency anemia secondary to blood loss (chronic): D50.0

## 2022-11-15 NOTE — Patient Instructions (Signed)
[facility] **VISIT SUMMARY & IMPORTANT INSTRUCTIONS **   You were seen today by Dr. Ellin Saba & Rojelio Brenner PA-C for your anemia and blood clot.    BLOOD CLOT: Continue taking Eliquis twice daily, unless you are having any major bleeding. We will check ultrasound again in 3 months.  ANEMIA We will schedule you for IV iron x 3 doses We will check blood counts every 2 weeks  FOLLOW-UP APPOINTMENT: Labs and office visit in 6 weeks  ** Thank you for trusting me with your healthcare!  I strive to provide all of my patients with quality care at each visit.  If you receive a survey for this visit, I would be so grateful to you for taking the time to provide feedback.  Thank you in advance!  ~ Mike Hamre                   Dr. Doreatha Massed   &   Rojelio Brenner, PA-C   - - - - - - - - - - - - - - - - - -    Thank you for choosing [facility] at [facility] to provide your oncology and hematology care.  To afford each patient quality time with our provider, please arrive at least 15 minutes before your scheduled appointment time.   If you have a lab appointment with the Cancer Center please come in thru the Main Entrance and check in at the main information desk.  You need to re-schedule your appointment should you arrive 10 or more minutes late.  We strive to give you quality time with our providers, and arriving late affects you and other patients whose appointments are after yours.  Also, if you no show three or more times for appointments you may be dismissed from the clinic at the providers discretion.     Again, thank you for choosing [facility].  Our hope is that these requests will decrease the amount of time that you wait before being seen by our physicians.       _____________________________________________________________  Should you have questions after your visit to [facility],  please contact our office at [phone] and follow the prompts.  Our office hours are 8:00 a.m. and 4:30 p.m. Monday - Friday.  Please note that voicemails left after 4:00 p.m. may not be returned until the following business day.  We are closed weekends and major holidays.  You do have access to a nurse 24-7, just call the main number to the clinic [phone] and do not press any options, hold on the line and a nurse will answer the phone.    For prescription refill requests, have your pharmacy contact our office and allow 72 hours.

## 2022-11-15 NOTE — Progress Notes (Signed)
[facility] [address], [place] [address]   CLINIC:  Medical Oncology/Hematology  CONSULT NOTE  Patient Care Team: [name], MD as PCP - General (Family Medicine)  CHIEF COMPLAINTS/PURPOSE OF CONSULTATION:  Acute left lower extremity DVT / recurrent DVT   HISTORY OF PRESENTING ILLNESS:   Stefanie [name] 82 y.o. female is here at the request of Dr. Standley Dakins (hospitalist) for evaluation and treatment of left lower extremity DVT after hospitalization in May 2024.    ----MEDICAL RECORDS REVIEW---- ED VISIT 07/01/2022 for left leg swelling.  She was diagnosed with superficial thrombophlebitis of great saphenous vein extending to the saphenofemoral junction, but negative for DVT, as evidenced with venous US on 07/01/2022 and 07/05/2022.  She was started on Eliquis on 07/01/2022. ED VISIT (08/24/2022) due to fall at home in March 2024 that resulted in injury to Achilles tendon. Venous US left leg (08/24/2022): No evidence of acute or chronic DVT or SVT within the left lower extremity. HOSPITALIZATION  from 09/29/2022 through 10/01/2022 after sustaining another fall. Venous US (09/29/2022): Nonocclusive DVT in left femoral vein. 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 Vascular consult (09/30/2022) per Dr. Gerarda Fraction - consider IVC if she had persistent DVT and worsening anemia in the setting of anticoagulation.  However, ultrasound from 09/30/2022 did not show any DVT in the left femoral vein and chronic DVT and gastrocnemius was not thought to need anticoagulation.  Therefore, IVC placement was canceled. Heme positive stool, but no gross hematochezia or melena. EGD (10/01/2022): Gastritis, but limited evaluation due to large amount of food residue in stomach/duodenum. Hemoglobin  ranged from 7.9-8.8 during this hospitalization. Eliquis was STOPPED at discharge on 10/01/2022. HOSPITALIZATION from 10/22/2022 through 10/27/2022 for acute LLE DVT and anemia.  (Presented to ED with fall at home and worsening LLE edema) Venous US (10/22/2022) showed new acute occlusive DVT seen in left popliteal, posterior tibial, and peroneal veins. There was concern regarding heme positive stool, anemia, and recurrent falls.  However, patient/patient's family decided to proceed with full anticoagulation after discussion of risks and benefits.  She was initially treated with heparin during hospitalization, and transition to Eliquis on 10/26/2022. IVC placement via IR (Dr. Elby Showers) on 10/24/2022 - was recommended at hospital discharge to complete anticoagulation for 6 to 12 weeks if able to tolerate without significant bleeding, since IVC filter and failure rate is reportedly higher if anticoagulation is discontinued abruptly immediately after placing IVC filter. Hgb dropped to 6.5 during hospitalization, transfused 1 unit PRBC on 10/24/2022. Outpatient gastroenterology follow-up (11/07/2022): Patient is currently declined evaluation with EGD/colonoscopy, but this could be reconsidered in the future if able to hold Eliquis and continuing to show signs of GI blood loss.  She was referred to hematology for assistance with iron supplementation and monitoring of hemoglobin while she remains on Eliquis. IR (Dr. Elby Showers) to arrange for outpatient follow-up of IVC.  -----PATIENT HISTORY----- Stefanie [name] is unaccompanied at today's visit.  She is somewhat of a poor historian, with impaired recent memory and difficulty with word finding.  Overall, she reports that she feels "pretty good" at today's visit.  Patient denies any history of blood clots prior to superficial thrombophlebitis in January 2024.  She presents in wheelchair at today's visit, but is not certain how long she has required wheelchair.  She will  occasionally walk with a walker.  She is a non-smoker and denies any history of malignancy, heart failure, or inflammatory bowel disease.  She has never had any miscarriages.  She reports that her younger brother has had blood clots, but she does not know the details.  She denies any family history of cancer.  She had one daughter, who passed away at age 69 due to brain aneurysm.  She has been taking Eliquis since hospital discharge on 10/27/2022.  She denies any rectal bleeding, melena, or other signs of major bleeding.  She continues to have significant left leg edema, but denies any significant pain.  She denies any shortness of breath, dyspnea, chest pain, hemoptysis, or palpitations.  She denies any major fatigue, pica, lightheadedness, headaches, or syncope.  She reports 60% energy and 100% appetite.    ASSESSMENT & PLAN:  1.  Recurrent LLE DVT - Initial episode of left leg superficial venous thrombophlebitis in January 2024, treated with Eliquis through April 2024. - Venous US left leg (08/24/2022): No evidence of acute or chronic DVT or SVT in left leg - Venous US (09/29/2022): Nonocclusive DVT in left femoral vein - Venous US (09/30/2022): Chronic DVT of left gastrocnemius veins and findings consistent with chronic superficial thrombosis in the left small saphenous vein - 10/22/2022-10/27/2022 patient admitted to the hospital with new acute left lower extremity DVT.  Placed on back on Eliquis as of 10/26/2022. IVC placement on 10/24/2022 due to concern for concurrent GI bleeding.  IR recommended a full 6-12 weeks of anticoagulation before stopping therapy for any reason - Concern regarding chronic anticoagulation due to fall risk and suspected GI blood loss.  Per risk/benefit discussion with family during hospitalization in May 2024, family opted for full dose anticoagulation with exceptions of associated risks. - PLAN: Continue full dose Eliquis at present along with IVC filter for the time  being. - For the time being, we will focus on optimizing iron deficiency anemia (see below), but will plan on checking repeat venous US and possible coagulopathy panel in about 3 months (September 2024)  2.  Iron deficiency anemia - Hgb 10.2 on 07/01/2022 (prior to starting Eliquis for initial episode of superficial thrombophlebitis) - During hospitalization in April 2024, patient had heme positive stool and worsening anemia, with Hgb down to 7.9.  EGD from 10/01/2022 showed gastritis, but limited evaluation due to large amount of food residue in stomach/duodenum. - During hospitalization in May 2024, Hgb dropped to 6.5, and she was transfused 1 unit PRBC on 10/24/2022.  Family opted to continue anticoagulation after risk/benefit discussion with hospitalist. - Following with [facility] (Dr. Rhea Belton / Hyacinth Meeker, PA-C) - patient declined evaluation with EGD/colonoscopy, but this could be reconsidered in the future if able to hold Eliquis and continuing to show signs of GI blood loss. - Patient denies any rectal bleeding or melena.  No abnormal fatigue or pica. - Labs (11/07/2022) with Hgb 8.9, ferritin 12.4, and iron saturation 4.3%. - PLAN: We will schedule her for IV Feraheme x 3.  We discussed potential risk of reaction and other side effects, and patient is agreeable to proceed.  Will give IV Pepcid due to history of rash from other medications. - We will check CBC/BB sample every 2 weeks - Repeat CBC and iron panel with RTC in 6 weeks  3.  Other history - PMH: IBS, GERD, hypothyroidism, fibromyalgia, anxiety/depression, arthritis - SOCIAL: Patient is widowed.  She was living at home alone prior to hospitalization in May  2024.  She currently resides at [facility] for acute rehab.  She denies any alcohol, tobacco, or illicit substance use. - FAMILY: Younger brother has had blood clots, but she does not know the details.  She denies any family history of cancer.  She had one daughter, who  passed away at age 21 due to brain aneurysm.   PLAN SUMMARY: >> IV Feraheme x 3 >> CBC/BB sample every 2 weeks >> Labs in 6 weeks  CBC/D, ferritin, iron/TIBC >> OFFICE visit in 6 weeks     MEDICAL HISTORY:  Past Medical History:  Diagnosis Date   Anxiety    Arthritis    Colon polyps    Depression    Diverticulosis of colon (without mention of hemorrhage)    Fibromyalgia    GERD (gastroesophageal reflux disease)    Hiatal hernia    History of IBS    History of kidney stones    Hypothyroidism     SURGICAL HISTORY: Past Surgical History:  Procedure Laterality Date   ABDOMINAL HYSTERECTOMY     ANKLE SURGERY     bilateral    APPENDECTOMY  1985   BREAST BIOPSY Left    Benign    CHOLECYSTECTOMY  1985   ESOPHAGEAL MANOMETRY N/A 10/22/2012   Procedure: ESOPHAGEAL MANOMETRY (EM);  Surgeon: Mardella Layman, MD;  Location: [facility];  Service: Endoscopy;  Laterality: N/A;   ESOPHAGOGASTRODUODENOSCOPY N/A 10/01/2022   Procedure: ESOPHAGOGASTRODUODENOSCOPY (EGD);  Surgeon: [name], MD;  Location: [facility] ENDOSCOPY;  Service: Gastroenterology;  Laterality: N/A;   EXTRACORPOREAL SHOCK WAVE LITHOTRIPSY Left 11/26/2018   Procedure: EXTRACORPOREAL SHOCK WAVE LITHOTRIPSY (ESWL);  Surgeon: Marcine Matar, MD;  Location: [facility] ORS;  Service: Urology;  Laterality: Left;   HEMORRHOID SURGERY     IR IVC FILTER PLMT / S&I /IMG GUID/MOD SED  10/24/2022   RECTOCELE REPAIR     TUBAL LIGATION      SOCIAL HISTORY: Social History   Socioeconomic History   Marital status: Widowed    Spouse name: Not on file   Number of children: 1   Years of education: Not on file   Highest education level: Not on file  Occupational History   Occupation: retired    Associate Professor: RETIRED  Tobacco Use   Smoking status: Never   Smokeless tobacco: Never  Vaping Use   Vaping Use: Never used  Substance and Sexual Activity   Alcohol use: No    Alcohol/week: 0.0 standard drinks of alcohol   Drug use:  No   Sexual activity: Not on file  Other Topics Concern   Not on file  Social History Narrative   Not on file   Social Determinants of Health   Financial Resource Strain: Not on file  Food Insecurity: Food Insecurity Present (10/22/2022)   Hunger Vital Sign    Worried About Running Out of Food in the Last Year: Sometimes true    Ran Out of Food in the Last Year: Sometimes true  Transportation Needs: No Transportation Needs (10/22/2022)   PRAPARE - Administrator, Civil Service (Medical): No    Lack of Transportation (Non-Medical): No  Physical Activity: Not on file  Stress: Not on file  Social Connections: Not on file  Intimate Partner Violence: Not At Risk (10/22/2022)   Humiliation, Afraid, Rape, and Kick questionnaire    Fear of Current or Ex-Partner: No    Emotionally Abused: No    Physically Abused: No    Sexually Abused: No  FAMILY HISTORY: Family History  Problem Relation Age of Onset   Colon polyps Brother    Breast cancer Neg Hx     ALLERGIES:  is allergic to gabapentin, lidocaine, metoclopramide, other, sulfa antibiotics, z-pak [azithromycin], metoclopramide hcl, penicillins, procaine hcl, and macrodantin [nitrofurantoin macrocrystal].  MEDICATIONS:  Current Outpatient Medications  Medication Sig Dispense Refill   acetaminophen (TYLENOL) 500 MG tablet Take 2 tablets (1,000 mg total) by mouth every 6 (six) hours as needed for moderate pain. 30 tablet 0   apixaban (ELIQUIS) 5 MG TABS tablet 2 po BID thru 11/01/22 then 1 po BID 60 tablet    escitalopram (LEXAPRO) 20 MG tablet Take 20 mg by mouth daily.     fexofenadine (ALLEGRA) 60 MG tablet Take 1 tablet (60 mg total) by mouth 2 (two) times daily as needed for allergies or rhinitis.     levothyroxine (SYNTHROID, LEVOTHROID) 75 MCG tablet Take 75 mcg by mouth at bedtime.     pantoprazole (PROTONIX) 40 MG tablet Take 1 tablet (40 mg total) by mouth 2 (two) times daily. 60 tablet 3   polyethylene glycol  (MIRALAX / GLYCOLAX) 17 g packet Take 17 g by mouth daily. 14 each 0   potassium chloride (KLOR-CON) 10 MEQ tablet Take 10 mEq by mouth daily.     Vitamin D, Ergocalciferol, (DRISDOL) 1.25 MG (50000 UNIT) CAPS capsule Take 50,000 Units by mouth every 7 (seven) days.     No current facility-administered medications for this visit.    REVIEW OF SYSTEMS:    Review of Systems  Constitutional:  Positive for fatigue. Negative for appetite change, chills, diaphoresis, fever and unexpected weight change.  HENT:   Negative for lump/mass and nosebleeds.   Eyes:  Negative for eye problems.  Respiratory:  Negative for cough, hemoptysis and shortness of breath.   Cardiovascular:  Negative for chest pain, leg swelling and palpitations.  Gastrointestinal:  Negative for abdominal pain, blood in stool, constipation, diarrhea, nausea and vomiting.  Genitourinary:  Negative for hematuria.   Musculoskeletal:  Positive for arthralgias.  Skin: Negative.   Neurological:  Negative for dizziness, headaches and light-headedness.  Hematological:  Does not bruise/bleed easily.      PHYSICAL EXAMINATION:   ECOG PERFORMANCE STATUS: 3 - Symptomatic, >50% confined to bed  There were no vitals filed for this visit. There were no vitals filed for this visit.  Physical Exam Constitutional:      Appearance: Normal appearance. She is obese.     Comments: Presents in wheelchair  HENT:     Head: Normocephalic and atraumatic.     Mouth/Throat:     Mouth: Mucous membranes are moist.  Eyes:     Extraocular Movements: Extraocular movements intact.     Pupils: Pupils are equal, round, and reactive to light.  Cardiovascular:     Rate and Rhythm: Normal rate and regular rhythm.     Pulses: Normal pulses.     Heart sounds: Normal heart sounds.  Pulmonary:     Effort: Pulmonary effort is normal.     Breath sounds: Normal breath sounds.  Abdominal:     General: Bowel sounds are normal.     Palpations: Abdomen is  soft.     Tenderness: There is no abdominal tenderness.  Musculoskeletal:        General: No swelling.     Right lower leg: Edema (trace) present.     Left lower leg: Edema (2+) present.  Lymphadenopathy:     Cervical: No cervical  adenopathy.  Skin:    General: Skin is warm and dry.  Neurological:     General: No focal deficit present.     Mental Status: She is alert and oriented to person, place, and time.  Psychiatric:        Mood and Affect: Mood normal.        Behavior: Behavior normal.        Cognition and Memory: She exhibits impaired recent memory.      LABORATORY DATA:  I have reviewed the data as listed Recent Results (from the past 2160 hour(s))  CBC     Status: Abnormal   Collection Time: 08/24/22 11:56 AM  Result Value Ref Range   WBC 5.8 4.0 - 10.5 K/uL   RBC 3.40 (L) 3.87 - 5.11 MIL/uL   Hemoglobin 9.5 (L) 12.0 - 15.0 g/dL   HCT 18.8 (L) 41.6 - 60.6 %   MCV 92.1 80.0 - 100.0 fL   MCH 27.9 26.0 - 34.0 pg   MCHC 30.4 30.0 - 36.0 g/dL   RDW 30.1 60.1 - 09.3 %   Platelets 266 150 - 400 K/uL   nRBC 0.0 0.0 - 0.2 %    Comment: Performed at [facility], [address], [place], [place] [address]  Basic metabolic panel     Status: Abnormal   Collection Time: 08/24/22 11:56 AM  Result Value Ref Range   Sodium 136 135 - 145 mmol/L   Potassium 2.9 (L) 3.5 - 5.1 mmol/L   Chloride 99 98 - 111 mmol/L   CO2 30 22 - 32 mmol/L   Glucose, Bld 160 (H) 70 - 99 mg/dL    Comment: Glucose reference range applies only to samples taken after fasting for at least 8 hours.   BUN 9 8 - 23 mg/dL   Creatinine, Ser 3.22 0.44 - 1.00 mg/dL   Calcium 8.3 (L) 8.9 - 10.3 mg/dL   GFR, Estimated >02 >54 mL/min    Comment: (NOTE) Calculated using the CKD-EPI Creatinine Equation (2021)    Anion gap 7 5 - 15    Comment: Performed at [facility], [address] [place], [place] [address]  CBC     Status: Abnormal   Collection Time: 09/29/22  5:10 PM  Result Value Ref Range   WBC 6.9  4.0 - 10.5 K/uL   RBC 3.29 (L) 3.87 - 5.11 MIL/uL   Hemoglobin 8.8 (L) 12.0 - 15.0 g/dL   HCT 37.6 (L) 28.3 - 15.1 %   MCV 88.4 80.0 - 100.0 fL   MCH 26.7 26.0 - 34.0 pg   MCHC 30.2 30.0 - 36.0 g/dL   RDW 76.1 60.7 - 37.1 %   Platelets 315 150 - 400 K/uL   nRBC 0.0 0.0 - 0.2 %    Comment: Performed at [facility], [address] [place], [place] [address]  Basic metabolic panel     Status: Abnormal   Collection Time: 09/29/22  5:10 PM  Result Value Ref Range   Sodium 139 135 - 145 mmol/L   Potassium 3.6 3.5 - 5.1 mmol/L   Chloride 102 98 - 111 mmol/L   CO2 28 22 - 32 mmol/L   Glucose, Bld 115 (H) 70 - 99 mg/dL    Comment: Glucose reference range applies only to samples taken after fasting for at least 8 hours.   BUN 9 8 - 23 mg/dL   Creatinine, Ser 4.85 0.44 - 1.00 mg/dL   Calcium 9.1 8.9 - 10.3  mg/dL   GFR, Estimated >30 >86 mL/min    Comment: (NOTE) Calculated using the CKD-EPI Creatinine Equation (2021)    Anion gap 9 5 - 15    Comment: Performed at [facility], [address] [place], [place] [address]  Brain natriuretic peptide     Status: None   Collection Time: 09/29/22  5:10 PM  Result Value Ref Range   B Natriuretic Peptide 65.8 0.0 - 100.0 pg/mL    Comment: Performed at [facility], [address] [place], [place] [address]  Occult blood card to lab, stool Provider will collect     Status: Abnormal   Collection Time: 09/29/22  9:06 PM  Result Value Ref Range   Fecal Occult Bld POSITIVE (A) NEGATIVE    Comment: Performed at [facility], [address] [place], [place] [address]  Hemoglobin and hematocrit, blood     Status: Abnormal   Collection Time: 09/30/22  3:20 AM  Result Value Ref Range   Hemoglobin 8.1 (L) 12.0 - 15.0 g/dL   HCT 24.4 (L) 01.0 - 27.2 %    Comment: Performed at [facility], [address], [place], [place] [address]  Heparin level (unfractionated)      Status: None   Collection Time: 09/30/22  8:51 AM  Result Value Ref Range   Heparin Unfractionated 0.30 0.30 - 0.70 IU/mL    Comment: (NOTE) The clinical reportable range upper limit is being lowered to >1.10 to align with the FDA approved guidance for the current laboratory assay.  If heparin results are below expected values, and patient dosage has  been confirmed, suggest follow up testing of antithrombin III levels. Performed at [facility], [address], [place], [place] [address]   Hemoglobin and hematocrit, blood     Status: Abnormal   Collection Time: 09/30/22  9:00 AM  Result Value Ref Range   Hemoglobin 8.1 (L) 12.0 - 15.0 g/dL   HCT 42.5 (L) 95.6 - 38.7 %    Comment: Performed at [facility], [address] [place], [place] [address]  Comprehensive metabolic panel     Status: Abnormal   Collection Time: 09/30/22  9:00 AM  Result Value Ref Range   Sodium 139 135 - 145 mmol/L   Potassium 3.8 3.5 - 5.1 mmol/L   Chloride 106 98 - 111 mmol/L   CO2 27 22 - 32 mmol/L   Glucose, Bld 98 70 - 99 mg/dL    Comment: Glucose reference range applies only to samples taken after fasting for at least 8 hours.   BUN 6 (L) 8 - 23 mg/dL   Creatinine, Ser 2.95 0.44 - 1.00 mg/dL   Calcium 8.2 (L) 8.9 - 10.3 mg/dL   Total Protein 6.2 (L) 6.5 - 8.1 g/dL   Albumin 3.1 (L) 3.5 - 5.0 g/dL   AST 14 (L) 15 - 41 U/L   ALT 10 0 - 44 U/L   Alkaline Phosphatase 105 38 - 126 U/L   Total Bilirubin 0.6 0.3 - 1.2 mg/dL   GFR, Estimated >18 >84 mL/min    Comment: (NOTE) Calculated using the CKD-EPI Creatinine Equation (2021)    Anion gap 6 5 - 15    Comment: Performed at [facility], [address], [place], [place] [address]  Hemoglobin and hematocrit, blood     Status: Abnormal   Collection Time: 09/30/22  2:50 PM  Result Value Ref Range   Hemoglobin 8.0 (  L) 12.0 - 15.0 g/dL   HCT 16.1 (L) 09.6 - 04.5 %    Comment: Performed at [facility], [address], [place], [place] [address]  Hemoglobin and hematocrit, blood     Status: Abnormal   Collection Time: 09/30/22  9:10 PM  Result Value Ref Range   Hemoglobin 7.9 (L) 12.0 - 15.0 g/dL   HCT 19.1 (L) 47.8 - 29.5 %    Comment: Performed at [facility], [address], [place], [place] [address]  CBC with Differential/Platelet     Status: Abnormal   Collection Time: 10/01/22  1:42 AM  Result Value Ref Range   WBC 8.3 4.0 - 10.5 K/uL   RBC 3.01 (L) 3.87 - 5.11 MIL/uL   Hemoglobin 8.2 (L) 12.0 - 15.0 g/dL   HCT 86.5 (L) 78.4 - 69.6 %   MCV 89.7 80.0 - 100.0 fL   MCH 27.2 26.0 - 34.0 pg   MCHC 30.4 30.0 - 36.0 g/dL   RDW 29.5 28.4 - 13.2 %   Platelets 283 150 - 400 K/uL   nRBC 0.0 0.0 - 0.2 %   Neutrophils Relative % 50 %   Neutro Abs 4.2 1.7 - 7.7 K/uL   Lymphocytes Relative 32 %   Lymphs Abs 2.6 0.7 - 4.0 K/uL   Monocytes Relative 12 %   Monocytes Absolute 1.0 0.1 - 1.0 K/uL   Eosinophils Relative 5 %   Eosinophils Absolute 0.4 0.0 - 0.5 K/uL   Basophils Relative 1 %   Basophils Absolute 0.1 0.0 - 0.1 K/uL   Immature Granulocytes 0 %   Abs Immature Granulocytes 0.01 0.00 - 0.07 K/uL    Comment: Performed at [facility], [address], [place], [place] [address]  Basic metabolic panel     Status: Abnormal   Collection Time: 10/22/22 11:11 AM  Result Value Ref Range   Sodium 138 135 - 145 mmol/L   Potassium 3.6 3.5 - 5.1 mmol/L   Chloride 103 98 - 111 mmol/L   CO2 27 22 - 32 mmol/L   Glucose, Bld 120 (H) 70 - 99 mg/dL    Comment: Glucose reference range applies only to samples taken after fasting for at least 8 hours.   BUN 10 8 - 23 mg/dL   Creatinine, Ser 2.72 0.44 - 1.00 mg/dL   Calcium 8.7 (L) 8.9 - 10.3 mg/dL   GFR, Estimated >53 >66 mL/min    Comment: (NOTE) Calculated using the CKD-EPI Creatinine Equation (2021)    Anion gap 8 5 - 15    Comment: Performed at [facility], [address], [place], [place] [address]  CBC with Differential     Status:  Abnormal   Collection Time: 10/22/22 11:11 AM  Result Value Ref Range   WBC 8.7 4.0 - 10.5 K/uL   RBC 3.16 (L) 3.87 - 5.11 MIL/uL   Hemoglobin 8.2 (L) 12.0 - 15.0 g/dL   HCT 74.2 (L) 59.5 - 63.8 %   MCV 88.9 80.0 - 100.0 fL   MCH 25.9 (L) 26.0 - 34.0 pg   MCHC 29.2 (L) 30.0 - 36.0 g/dL   RDW 75.6 (H) 43.3 - 29.5 %   Platelets 333 150 - 400 K/uL   nRBC 0.0 0.0 - 0.2 %   Neutrophils Relative % 76 %   Neutro Abs 6.5 1.7 - 7.7 K/uL   Lymphocytes Relative 14 %   Lymphs Abs 1.2 0.7 - 4.0 K/uL  Monocytes Relative 8 %   Monocytes Absolute 0.7 0.1 - 1.0 K/uL   Eosinophils Relative 1 %   Eosinophils Absolute 0.1 0.0 - 0.5 K/uL   Basophils Relative 1 %   Basophils Absolute 0.1 0.0 - 0.1 K/uL   Immature Granulocytes 0 %   Abs Immature Granulocytes 0.03 0.00 - 0.07 K/uL    Comment: Performed at [facility], [address] [place], [place] [address]  Urinalysis, Routine w reflex microscopic -Urine, Clean Catch     Status: None   Collection Time: 10/22/22 12:53 PM  Result Value Ref Range   Color, Urine YELLOW YELLOW   APPearance CLEAR CLEAR   Specific Gravity, Urine 1.010 1.005 - 1.030   pH 7.0 5.0 - 8.0   Glucose, UA NEGATIVE NEGATIVE mg/dL   Hgb urine dipstick NEGATIVE NEGATIVE   Bilirubin Urine NEGATIVE NEGATIVE   Ketones, ur NEGATIVE NEGATIVE mg/dL   Protein, ur NEGATIVE NEGATIVE mg/dL   Nitrite NEGATIVE NEGATIVE   Leukocytes,Ua NEGATIVE NEGATIVE    Comment: Microscopic not done on urines with negative protein, blood, leukocytes, nitrite, or glucose < 500 mg/dL. Performed at [facility], [address], [place], [place] [address]   Heparin level (unfractionated)     Status: None   Collection Time: 10/22/22 10:47 PM  Result Value Ref Range   Heparin Unfractionated 0.31 0.30 - 0.70 IU/mL    Comment: (NOTE) The clinical reportable range upper limit is being lowered to >1.10 to align with the FDA approved guidance for the current laboratory assay.  If heparin results are below  expected values, and patient dosage has  been confirmed, suggest follow up testing of antithrombin III levels. Performed at [facility], [address] [place], [place] [address]   Basic metabolic panel     Status: Abnormal   Collection Time: 10/23/22  4:21 AM  Result Value Ref Range   Sodium 138 135 - 145 mmol/L   Potassium 3.1 (L) 3.5 - 5.1 mmol/L   Chloride 103 98 - 111 mmol/L   CO2 26 22 - 32 mmol/L   Glucose, Bld 135 (H) 70 - 99 mg/dL    Comment: Glucose reference range applies only to samples taken after fasting for at least 8 hours.   BUN 9 8 - 23 mg/dL   Creatinine, Ser 9.14 0.44 - 1.00 mg/dL   Calcium 8.5 (L) 8.9 - 10.3 mg/dL   GFR, Estimated >78 >29 mL/min    Comment: (NOTE) Calculated using the CKD-EPI Creatinine Equation (2021)    Anion gap 9 5 - 15    Comment: Performed at [facility], [address] [place], [place] [address]  CBC     Status: Abnormal   Collection Time: 10/23/22  4:21 AM  Result Value Ref Range   WBC 7.0 4.0 - 10.5 K/uL   RBC 2.89 (L) 3.87 - 5.11 MIL/uL   Hemoglobin 7.5 (L) 12.0 - 15.0 g/dL   HCT 08.6 (L) 57.8 - 46.9 %   MCV 89.6 80.0 - 100.0 fL   MCH 26.0 26.0 - 34.0 pg   MCHC 29.0 (L) 30.0 - 36.0 g/dL   RDW 62.9 (H) 52.8 - 41.3 %   Platelets 308 150 - 400 K/uL   nRBC 0.0 0.0 - 0.2 %    Comment: Performed at [facility], [address] [place], [place] [address]  Heparin level (unfractionated)     Status: None   Collection Time: 10/23/22  8:01 AM  Result Value Ref Range   Heparin  Unfractionated 0.39 0.30 - 0.70 IU/mL    Comment: (NOTE) The clinical reportable range upper limit is being lowered to >1.10 to align with the FDA approved guidance for the current laboratory assay.  If heparin results are below expected values, and patient dosage has  been confirmed, suggest follow up testing of antithrombin III levels. Performed at [facility], [address] [place], [place] [address]   ABO/Rh     Status: None   Collection Time:  10/24/22  4:48 AM  Result Value Ref Range   ABO/RH(D)      A POS Performed at [facility], [address], [place], [place] [address]   CBC     Status: Abnormal   Collection Time: 10/24/22  4:50 AM  Result Value Ref Range   WBC 6.9 4.0 - 10.5 K/uL   RBC 2.62 (L) 3.87 - 5.11 MIL/uL   Hemoglobin 6.7 (LL) 12.0 - 15.0 g/dL    Comment: REPEATED TO VERIFY THIS CRITICAL RESULT HAS VERIFIED AND BEEN CALLED TO H. MOORE BY TRENA MCCLAIN STEPHENS ON 05 20 2024 AT 0618, AND HAS BEEN READ BACK.     HCT 23.1 (L) 36.0 - 46.0 %   MCV 88.2 80.0 - 100.0 fL   MCH 25.6 (L) 26.0 - 34.0 pg   MCHC 29.0 (L) 30.0 - 36.0 g/dL   RDW 09.8 (H) 11.9 - 14.7 %   Platelets 285 150 - 400 K/uL   nRBC 0.0 0.0 - 0.2 %    Comment: Performed at [facility], [address], [place], [place] [address]  Hemoglobin and hematocrit, blood     Status: Abnormal   Collection Time: 10/24/22  7:15 AM  Result Value Ref Range   Hemoglobin 6.5 (LL) 12.0 - 15.0 g/dL    Comment: REPEATED TO VERIFY THIS CRITICAL RESULT HAS VERIFIED AND BEEN CALLED TO E. WRIGHT BY TRENA MCCLAIN STEPHENS ON 05 20 2024 AT 0733, AND HAS BEEN READ BACK.     HCT 22.4 (L) 36.0 - 46.0 %    Comment: Performed at [facility], [address], [place], [place] [address]  Prepare RBC (crossmatch)     Status: None   Collection Time: 10/24/22  8:14 AM  Result Value Ref Range   Order Confirmation      ORDER PROCESSED BY BLOOD BANK Performed at [facility], [address] [place], [place] [address]   Type and screen [facility]     Status: None   Collection Time: 10/24/22  8:14 AM  Result Value Ref Range   ABO/RH(D) A POS    Antibody Screen NEG    Sample Expiration 10/27/2022,2359    Unit Number [id number]    Blood Component Type RED CELLS,LR    Unit division 00    Status of Unit ISSUED,FINAL    Transfusion Status OK TO TRANSFUSE    Crossmatch Result      Compatible Performed at [facility], [address], [place], [place] [address]    BPAM RBC     Status: None   Collection Time: 10/24/22  8:14 AM  Result Value Ref Range   ISSUE DATE / TIME [id number]    Blood Product Unit Number [id number]    PRODUCT CODE [id number]    Unit Type and Rh 6200    Blood Product Expiration Date [id number]   Hemoglobin and hematocrit, blood     Status: Abnormal   Collection Time: 10/24/22  3:50 PM  Result Value Ref Range   Hemoglobin  8.4 (L) 12.0 - 15.0 g/dL    Comment: REPEATED TO VERIFY POST TRANSFUSION SPECIMEN    HCT 28.5 (L) 36.0 - 46.0 %    Comment: Performed at [facility], [address], [place], [place] [address]  CBC     Status: Abnormal   Collection Time: 10/25/22  2:11 AM  Result Value Ref Range   WBC 7.5 4.0 - 10.5 K/uL   RBC 3.10 (L) 3.87 - 5.11 MIL/uL   Hemoglobin 8.3 (L) 12.0 - 15.0 g/dL   HCT 60.4 (L) 54.0 - 98.1 %   MCV 87.4 80.0 - 100.0 fL   MCH 26.8 26.0 - 34.0 pg   MCHC 30.6 30.0 - 36.0 g/dL   RDW 19.1 (H) 47.8 - 29.5 %   Platelets 280 150 - 400 K/uL   nRBC 0.0 0.0 - 0.2 %    Comment: Performed at [facility], [address], [place], [place] [address]  Heparin level (unfractionated)     Status: Abnormal   Collection Time: 10/25/22  2:11 AM  Result Value Ref Range   Heparin Unfractionated 0.14 (L) 0.30 - 0.70 IU/mL    Comment: (NOTE) The clinical reportable range upper limit is being lowered to >1.10 to align with the FDA approved guidance for the current laboratory assay.  If heparin results are below expected values, and patient dosage has  been confirmed, suggest follow up testing of antithrombin III levels. Performed at [facility], [address], [place], [place] [address]   Heparin level (unfractionated)     Status: Abnormal   Collection Time: 10/25/22 12:27 PM  Result Value Ref Range   Heparin Unfractionated 0.27 (L) 0.30 - 0.70 IU/mL    Comment: (NOTE) The clinical reportable range upper limit is being lowered to >1.10 to align with the FDA approved guidance for the current  laboratory assay.  If heparin results are below expected values, and patient dosage has  been confirmed, suggest follow up testing of antithrombin III levels. Performed at [facility], [address], [place], [place] [address]   CBC     Status: Abnormal   Collection Time: 10/26/22  4:28 AM  Result Value Ref Range   WBC 6.6 4.0 - 10.5 K/uL   RBC 3.09 (L) 3.87 - 5.11 MIL/uL   Hemoglobin 8.2 (L) 12.0 - 15.0 g/dL   HCT 95.2 (L) 84.1 - 32.4 %   MCV 87.4 80.0 - 100.0 fL   MCH 26.5 26.0 - 34.0 pg   MCHC 30.4 30.0 - 36.0 g/dL   RDW 40.1 (H) 02.7 - 25.3 %   Platelets 304 150 - 400 K/uL   nRBC 0.0 0.0 - 0.2 %    Comment: Performed at [facility], [address], [place], [place] [address]  Heparin level (unfractionated)     Status: None   Collection Time: 10/26/22  4:28 AM  Result Value Ref Range   Heparin Unfractionated 0.34 0.30 - 0.70 IU/mL    Comment: (NOTE) The clinical reportable range upper limit is being lowered to >1.10 to align with the FDA approved guidance for the current laboratory assay.  If heparin results are below expected values, and patient dosage has  been confirmed, suggest follow up testing of antithrombin III levels. Performed at [facility], [address] [place], [place] [address]   Basic metabolic panel     Status: Abnormal   Collection Time: 10/26/22  4:28 AM  Result Value Ref Range   Sodium 136 135 - 145 mmol/L  Potassium 3.8 3.5 - 5.1 mmol/L   Chloride 100 98 - 111 mmol/L   CO2 28 22 - 32 mmol/L   Glucose, Bld 107 (H) 70 - 99 mg/dL    Comment: Glucose reference range applies only to samples taken after fasting for at least 8 hours.   BUN 13 8 - 23 mg/dL   Creatinine, Ser 5.78 0.44 - 1.00 mg/dL   Calcium 8.4 (L) 8.9 - 10.3 mg/dL   GFR, Estimated >46 >96 mL/min    Comment: (NOTE) Calculated using the CKD-EPI Creatinine Equation (2021)    Anion gap 8 5 - 15    Comment: Performed at [facility], [address], [place], [place] [address]  SARS  Coronavirus 2 by RT PCR (hospital order, performed in [facility] hospital lab) *cepheid single result test* Anterior Nasal Swab     Status: None   Collection Time: 10/26/22 12:39 PM   Specimen: Anterior Nasal Swab  Result Value Ref Range   SARS Coronavirus 2 by RT PCR NEGATIVE NEGATIVE    Comment: (NOTE) SARS-CoV-2 target nucleic acids are NOT DETECTED.  The SARS-CoV-2 RNA is generally detectable in upper and lower respiratory specimens during the acute phase of infection. The lowest concentration of SARS-CoV-2 viral copies this assay can detect is 250 copies / mL. A negative result does not preclude SARS-CoV-2 infection and should not be used as the sole basis for treatment or other patient management decisions.  A negative result may occur with improper specimen collection / handling, submission of specimen other than nasopharyngeal swab, presence of viral mutation(s) within the areas targeted by this assay, and inadequate number of viral copies (<250 copies / mL). A negative result must be combined with clinical observations, patient history, and epidemiological information.  Fact Sheet for Patients:   RoadLapTop.co.za  Fact Sheet for Healthcare Providers: http://kim-miller.com/  This test is not yet approved or  cleared by the [place] FDA and has been authorized for detection and/or diagnosis of SARS-CoV-2 by FDA under an Emergency Use Authorization (EUA).  This EUA will remain in effect (meaning this test can be used) for the duration of the COVID-19 declaration under Section 564(b)(1) of the Act, 21 U.S.C. section 360bbb-3(b)(1), unless the authorization is terminated or revoked sooner.  Performed at [facility], [address] [place], [place] [address]   CBC     Status: Abnormal   Collection Time: 10/27/22  4:07 AM  Result Value Ref Range   WBC 7.9 4.0 - 10.5 K/uL   RBC 3.27 (L) 3.87 - 5.11 MIL/uL   Hemoglobin 8.8  (L) 12.0 - 15.0 g/dL   HCT 40.1 (L) 02.7 - 25.3 %   MCV 88.7 80.0 - 100.0 fL   MCH 26.9 26.0 - 34.0 pg   MCHC 30.3 30.0 - 36.0 g/dL   RDW 66.4 (H) 40.3 - 47.4 %   Platelets 314 150 - 400 K/uL   nRBC 0.0 0.0 - 0.2 %    Comment: Performed at [facility], [address] [place], [place] [address]  Basic metabolic panel     Status: Abnormal   Collection Time: 10/31/22  8:00 AM  Result Value Ref Range   Sodium 134 (L) 135 - 145 mmol/L   Potassium 3.7 3.5 - 5.1 mmol/L   Chloride 99 98 - 111 mmol/L   CO2 24 22 - 32 mmol/L   Glucose, Bld 108 (H) 70 - 99 mg/dL    Comment: Glucose reference range applies only to samples taken  after fasting for at least 8 hours.   BUN 18 8 - 23 mg/dL   Creatinine, Ser 1.61 0.44 - 1.00 mg/dL   Calcium 8.5 (L) 8.9 - 10.3 mg/dL   GFR, Estimated >09 >60 mL/min    Comment: (NOTE) Calculated using the CKD-EPI Creatinine Equation (2021)    Anion gap 11 5 - 15    Comment: Performed at [facility], [address], [place], [place] [address]  CBC     Status: Abnormal   Collection Time: 10/31/22  8:00 AM  Result Value Ref Range   WBC 6.6 4.0 - 10.5 K/uL   RBC 3.08 (L) 3.87 - 5.11 MIL/uL   Hemoglobin 8.2 (L) 12.0 - 15.0 g/dL   HCT 81.1 (L) 91.4 - 78.2 %   MCV 87.3 80.0 - 100.0 fL   MCH 26.6 26.0 - 34.0 pg   MCHC 30.5 30.0 - 36.0 g/dL   RDW 95.6 (H) 21.3 - 08.6 %   Platelets 311 150 - 400 K/uL   nRBC 0.0 0.0 - 0.2 %    Comment: Performed at [facility], [address] [place], [place] [address]  Hemoglobin and hematocrit, blood     Status: Abnormal   Collection Time: 11/03/22  8:00 AM  Result Value Ref Range   Hemoglobin 8.3 (L) 12.0 - 15.0 g/dL   HCT 96.2 (L) 95.2 - 84.1 %    Comment: Performed at [facility], [address] [place], [place] [address]  IBC + Ferritin     Status: Abnormal   Collection Time: 11/07/22  2:09 PM  Result Value Ref Range   Iron 19 (L) 42 - 145 ug/dL   Transferrin 102.7 253.6 - 360.0 mg/dL   Saturation Ratios 4.3 (L) 20.0 -  50.0 %   Ferritin 12.4 10.0 - 291.0 ng/mL   TIBC 436.8 250.0 - 450.0 mcg/dL  Hemoglobin     Status: Abnormal   Collection Time: 11/07/22  2:09 PM  Result Value Ref Range   Hemoglobin 8.9 Repeated and verified X2. (L) 12.0 - 15.0 g/dL  TSH     Status: None   Collection Time: 11/14/22  8:00 AM  Result Value Ref Range   TSH 4.138 0.350 - 4.500 uIU/mL    Comment: Performed by a 3rd Generation assay with a functional sensitivity of <=0.01 uIU/mL. Performed at [facility], [address] [place], [place] [address]     RADIOGRAPHIC STUDIES: I have personally reviewed the radiological images as listed and agreed with the findings in the report. IR IVC FILTER PLMT / S&I Lenise Arena GUID/MOD SED  Result Date: 10/24/2022 CLINICAL DATA:  82 year old female with history of deep vein thrombosis and intolerance to anticoagulation due to gastrointestinal hemorrhage. EXAM: 1. ULTRASOUND GUIDANCE FOR VASCULAR ACCESS OF THE RIGHT internal jugular VEIN. 2. IVC VENOGRAM. 3. PERCUTANEOUS IVC FILTER PLACEMENT. ANESTHESIA/SEDATION: Moderate (conscious) sedation was employed during this procedure. A total of Versed 1.5 mg and Fentanyl 25 mcg was administered intravenously. Moderate Sedation Time: 16 minutes. The patient's level of consciousness and vital signs were monitored continuously by radiology nursing throughout the procedure under my direct supervision. CONTRAST:  40mL OMNIPAQUE IOHEXOL 300 MG/ML  SOLN FLUOROSCOPY TIME:  Sixty-two mGy PROCEDURE: The procedure, risks, benefits, and alternatives were explained to the patient. Questions regarding the procedure were encouraged and answered. The patient understands and consents to the procedure. The patient was prepped with Betadine in a sterile fashion, and a sterile drape was applied covering the operative field. A sterile gown  and sterile gloves were used for the procedure. Local anesthesia was provided with 1% Lidocaine. Under direct ultrasound guidance, a 21 gauge  needle was advanced into the right internal jugular vein with ultrasound image documentation performed. After securing access with a micropuncture dilator, a guidewire was advanced into the inferior vena cava. A deployment sheath was advanced over the guidewire. This was utilized to perform IVC venography. The deployment sheath was further positioned in an appropriate location for filter deployment. A Denali IVC filter was then advanced in the sheath. This was then fully deployed in the infrarenal IVC. Final filter position was confirmed with a fluoroscopic spot image. Contrast injection was also performed through the sheath under fluoroscopy to confirm patency of the IVC at the level of the filter. After the procedure the sheath was removed and hemostasis obtained with manual compression. COMPLICATIONS: None. FINDINGS: IVC venography demonstrates a normal caliber IVC with no evidence of thrombus. Renal veins are identified bilaterally. The IVC filter was successfully positioned below the level of the renal veins and is appropriately oriented. This IVC filter has both permanent and retrievable indications. IMPRESSION: Placement of percutaneous IVC filter in infrarenal IVC. IVC venogram shows no evidence of IVC thrombus and normal caliber of the inferior vena cava. This filter does have both permanent and retrievable indications. PLAN: This IVC filter is potentially retrievable. The patient will be assessed for filter retrieval by Interventional Radiology in approximately 8-12 weeks. Further recommendations regarding filter retrieval, continued surveillance or declaration of device permanence, will be made at that time. Marliss Coots, MD [facility] Electronically Signed   By: Marliss Coots M.D.   On: 10/24/2022 14:26   US Venous Img Lower  Left (DVT Study)  Result Date: 10/22/2022 CLINICAL DATA:  Left leg pain.  Fall today. EXAM: LEFT LOWER EXTREMITY  VENOUS DOPPLER ULTRASOUND TECHNIQUE: Gray-scale sonography with compression, as well as color and duplex ultrasound, were performed to evaluate the deep venous system(s) from the level of the common femoral vein through the popliteal and proximal calf veins. COMPARISON:  09/29/2022 FINDINGS: VENOUS Occlusive thrombus still seen within the left popliteal, posterior tibial and peroneal veins. Otherwise, normal compressibility of the common femoral and femoral veins. Visualized portions of profunda femoral vein and great saphenous vein unremarkable. No additional filling defects to suggest DVT on grayscale or color Doppler imaging. Doppler waveforms show normal direction of venous flow, normal respiratory plasticity and response to augmentation in the above knee veins. Limited views of the contralateral common femoral vein are unremarkable. OTHER None. Limitations: none IMPRESSION: New acute occlusive deep venous thrombus seen in the left popliteal, posterior tibial, and peroneal veins. Electronically Signed   By: Acquanetta Belling M.D.   On: 10/22/2022 12:24   CT Head Wo Contrast  Result Date: 10/22/2022 CLINICAL DATA:  82 year old female with head and neck pain following fall. Initial encounter. EXAM: CT HEAD WITHOUT CONTRAST CT CERVICAL SPINE WITHOUT CONTRAST TECHNIQUE: Multidetector CT imaging of the head and cervical spine was performed following the standard protocol without intravenous contrast. Multiplanar CT image reconstructions of the cervical spine were also generated. RADIATION DOSE REDUCTION: This exam was performed according to the departmental dose-optimization program which includes automated exposure control, adjustment of the mA and/or kV according to patient size and/or use of iterative reconstruction technique. COMPARISON:  08/15/2021 CTs and prior studies FINDINGS: CT HEAD FINDINGS Brain: No evidence of acute infarction, hemorrhage, hydrocephalus, extra-axial collection or mass lesion/mass effect.  Atrophy and chronic small-vessel  white matter ischemic changes again noted. Vascular: Carotid and vertebral atherosclerotic calcifications are noted. Skull: Normal. Negative for fracture or focal lesion. Sinuses/Orbits: No acute finding. Other: None. CT CERVICAL SPINE FINDINGS Alignment: Reversal of the normal cervical lordosis again identified. Minimal anterolisthesis at C3-4, C4-5, C5-6 and at C7-T1 again noted. No new subluxation identified. Skull base and vertebrae: No acute fracture. No primary bone lesion or focal pathologic process. Soft tissues and spinal canal: No prevertebral fluid or swelling. No visible canal hematoma. Disc levels: No acute abnormality. Degenerative changes/ankylosis at C1-2 again noted as well as moderate multilevel facet arthropathy. Mild multilevel degenerative disc disease/spondylosis again identified. Upper chest: No acute abnormality. Other: None IMPRESSION: 1. No evidence of acute intracranial abnormality. Atrophy and chronic small-vessel white matter ischemic changes. 2. No static evidence of acute injury to the cervical spine. Multilevel degenerative changes again noted. Electronically Signed   By: Harmon Pier M.D.   On: 10/22/2022 12:07   CT Cervical Spine Wo Contrast  Result Date: 10/22/2022 CLINICAL DATA:  82 year old female with head and neck pain following fall. Initial encounter. EXAM: CT HEAD WITHOUT CONTRAST CT CERVICAL SPINE WITHOUT CONTRAST TECHNIQUE: Multidetector CT imaging of the head and cervical spine was performed following the standard protocol without intravenous contrast. Multiplanar CT image reconstructions of the cervical spine were also generated. RADIATION DOSE REDUCTION: This exam was performed according to the departmental dose-optimization program which includes automated exposure control, adjustment of the mA and/or kV according to patient size and/or use of iterative reconstruction technique. COMPARISON:  08/15/2021 CTs and prior studies FINDINGS:  CT HEAD FINDINGS Brain: No evidence of acute infarction, hemorrhage, hydrocephalus, extra-axial collection or mass lesion/mass effect. Atrophy and chronic small-vessel white matter ischemic changes again noted. Vascular: Carotid and vertebral atherosclerotic calcifications are noted. Skull: Normal. Negative for fracture or focal lesion. Sinuses/Orbits: No acute finding. Other: None. CT CERVICAL SPINE FINDINGS Alignment: Reversal of the normal cervical lordosis again identified. Minimal anterolisthesis at C3-4, C4-5, C5-6 and at C7-T1 again noted. No new subluxation identified. Skull base and vertebrae: No acute fracture. No primary bone lesion or focal pathologic process. Soft tissues and spinal canal: No prevertebral fluid or swelling. No visible canal hematoma. Disc levels: No acute abnormality. Degenerative changes/ankylosis at C1-2 again noted as well as moderate multilevel facet arthropathy. Mild multilevel degenerative disc disease/spondylosis again identified. Upper chest: No acute abnormality. Other: None IMPRESSION: 1. No evidence of acute intracranial abnormality. Atrophy and chronic small-vessel white matter ischemic changes. 2. No static evidence of acute injury to the cervical spine. Multilevel degenerative changes again noted. Electronically Signed   By: Harmon Pier M.D.   On: 10/22/2022 12:07   DG Ankle 2 Views Left  Result Date: 10/22/2022 CLINICAL DATA:  Fall.  Pain. EXAM: LEFT ANKLE - 2 VIEW COMPARISON:  08/27/2022 FINDINGS: AP and cross-table lateral views. No oblique/mortise view performed. Diffuse soft tissue swelling. Osteopenia. Bone anchor within the calcaneus. Heterogeneity about the posterior calcaneus is likely due to remote trauma and/or surgery. Artifact degradation degrades the lateral view. Presumably from clothing or bedding material. IMPRESSION: Limited two view exam with artifact on the lateral view. Diffuse soft tissue swelling with postsurgical and likely remote posttraumatic  changes. No gross acute osseous abnormality. If ongoing clinical concern, recommend repeat with standard three-view series. Electronically Signed   By: Jeronimo Greaves M.D.   On: 10/22/2022 12:06   DG Shoulder Right  Result Date: 10/22/2022 CLINICAL DATA:  Fall.  Pain. EXAM: RIGHT SHOULDER - 2+ VIEW COMPARISON:  None Available. FINDINGS: High-riding humeral head, consistent with chronic rotator cuff insufficiency. No acute fracture or dislocation. Visualized portion of the right hemithorax is normal. IMPRESSION: No acute osseous abnormality. Electronically Signed   By: Jeronimo Greaves M.D.   On: 10/22/2022 12:03   DG Chest 1 View  Result Date: 10/22/2022 CLINICAL DATA:  Right-sided fall and pain. EXAM: CHEST  1 VIEW COMPARISON:  07/01/2022 FINDINGS: Oblique portable radiograph with multiple wires and leads overlying the chest. Midline trachea. Mild cardiomegaly. No right and no large left pleural effusion. No pneumothorax. No congestive failure. Mild right hemidiaphragm elevation. IMPRESSION: No acute findings.  Mild limitations as detailed above. Cardiomegaly and low lung volumes. Electronically Signed   By: Jeronimo Greaves M.D.   On: 10/22/2022 12:02     WRAP UP:   All questions were answered. The patient knows to call the clinic with any problems, questions or concerns.  Medical decision making: Moderate  Time spent on visit: I spent 40 minutes counseling the patient face to face. The total time spent in the appointment was 55 minutes and more than 50% was on counseling.  I, Rojelio Brenner PA-C, have seen this patient in conjunction with Dr. Doreatha Massed.  Greater than 50% of visit was performed by Dr. Ellin Saba.   Carnella Guadalajara, PA-C 11/15/22  5:44 PM   DR. Neco Kling: I have independently evaluated this patient and formulated my assessment and plan.  I agree with HPI, assessment and plan written by Rojelio Brenner, PA-C.  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  She also has severe iron deficiency anemia.  We discussed the need for aggressive iron repletion with parenteral iron therapy.  We discussed Feraheme weekly x 2.  Discussed rare chance of anaphylactic reaction.  We will schedule her as soon as possible and check her CBC every 2 weeks.  We will repeat ferritin and iron panel and other nutritional deficiencies in 6 weeks.  Will plan on repeating Doppler of the left lower extremity with D-dimer in 3 months.

## 2022-11-16 DIAGNOSIS — E039 Hypothyroidism, unspecified: Secondary | ICD-10-CM | POA: Diagnosis not present

## 2022-11-16 DIAGNOSIS — I82452 Acute embolism and thrombosis of left peroneal vein: Secondary | ICD-10-CM | POA: Diagnosis present

## 2022-11-16 DIAGNOSIS — I82442 Acute embolism and thrombosis of left tibial vein: Secondary | ICD-10-CM | POA: Diagnosis present

## 2022-11-16 DIAGNOSIS — I82432 Acute embolism and thrombosis of left popliteal vein: Secondary | ICD-10-CM | POA: Diagnosis present

## 2022-11-16 DIAGNOSIS — R5383 Other fatigue: Secondary | ICD-10-CM | POA: Diagnosis not present

## 2022-11-16 DIAGNOSIS — D509 Iron deficiency anemia, unspecified: Secondary | ICD-10-CM | POA: Diagnosis not present

## 2022-11-16 DIAGNOSIS — M1990 Unspecified osteoarthritis, unspecified site: Secondary | ICD-10-CM | POA: Diagnosis not present

## 2022-11-16 DIAGNOSIS — M2550 Pain in unspecified joint: Secondary | ICD-10-CM | POA: Diagnosis not present

## 2022-11-16 DIAGNOSIS — K589 Irritable bowel syndrome without diarrhea: Secondary | ICD-10-CM | POA: Diagnosis not present

## 2022-11-16 DIAGNOSIS — M797 Fibromyalgia: Secondary | ICD-10-CM | POA: Diagnosis not present

## 2022-11-16 DIAGNOSIS — F419 Anxiety disorder, unspecified: Secondary | ICD-10-CM | POA: Diagnosis not present

## 2022-11-16 DIAGNOSIS — Z83719 Family history of colon polyps, unspecified: Secondary | ICD-10-CM | POA: Diagnosis not present

## 2022-11-16 DIAGNOSIS — Z7901 Long term (current) use of anticoagulants: Secondary | ICD-10-CM | POA: Diagnosis not present

## 2022-11-16 DIAGNOSIS — Z79899 Other long term (current) drug therapy: Secondary | ICD-10-CM | POA: Diagnosis not present

## 2022-11-16 DIAGNOSIS — F32A Depression, unspecified: Secondary | ICD-10-CM | POA: Diagnosis not present

## 2022-11-16 DIAGNOSIS — K219 Gastro-esophageal reflux disease without esophagitis: Secondary | ICD-10-CM | POA: Diagnosis not present

## 2022-11-16 DIAGNOSIS — E669 Obesity, unspecified: Secondary | ICD-10-CM | POA: Diagnosis not present

## 2022-11-21 ENCOUNTER — Inpatient Hospital Stay: Payer: Medicare Other

## 2022-11-21 VITALS — BP 115/53 | HR 69 | Temp 98.4°F | Resp 17

## 2022-11-21 DIAGNOSIS — D500 Iron deficiency anemia secondary to blood loss (chronic): Secondary | ICD-10-CM

## 2022-11-21 DIAGNOSIS — I82432 Acute embolism and thrombosis of left popliteal vein: Secondary | ICD-10-CM | POA: Diagnosis not present

## 2022-11-21 MED ORDER — SODIUM CHLORIDE 0.9 % IV SOLN
510.0000 mg | Freq: Once | INTRAVENOUS | Status: AC
  Administered 2022-11-21: 510 mg via INTRAVENOUS
  Filled 2022-11-21: qty 17

## 2022-11-21 MED ORDER — FAMOTIDINE IN NACL 20-0.9 MG/50ML-% IV SOLN
20.0000 mg | Freq: Once | INTRAVENOUS | Status: AC
  Administered 2022-11-21: 20 mg via INTRAVENOUS
  Filled 2022-11-21: qty 50

## 2022-11-21 MED ORDER — SODIUM CHLORIDE 0.9 % IV SOLN: Freq: Once | INTRAVENOUS | Status: AC

## 2022-11-21 MED ORDER — CETIRIZINE HCL 10 MG PO TABS
10.0000 mg | ORAL_TABLET | Freq: Once | ORAL | Status: AC
  Administered 2022-11-21: 10 mg via ORAL
  Filled 2022-11-21: qty 1

## 2022-11-21 MED ORDER — ACETAMINOPHEN 325 MG PO TABS
650.0000 mg | ORAL_TABLET | Freq: Once | ORAL | Status: AC
  Administered 2022-11-21: 650 mg via ORAL
  Filled 2022-11-21: qty 2

## 2022-11-21 NOTE — Patient Instructions (Signed)
[facility]  Discharge Instructions: Thank you for choosing [facility] to provide your oncology and hematology care.  If you have a lab appointment with the Cancer Center - please note that after April 8th, 2024, all labs will be drawn in the cancer center.  You do not have to check in or register with the main entrance as you have in the past but will complete your check-in in the cancer center.  Wear comfortable clothing and clothing appropriate for easy access to any Portacath or PICC line.   We strive to give you quality time with your provider. You may need to reschedule your appointment if you arrive late (15 or more minutes).  Arriving late affects you and other patients whose appointments are after yours.  Also, if you miss three or more appointments without notifying the office, you may be dismissed from the clinic at the provider's discretion.      For prescription refill requests, have your pharmacy contact our office and allow 72 hours for refills to be completed.    Today you received Feraheme IV iron infusion.   .  BELOW ARE SYMPTOMS THAT SHOULD BE REPORTED IMMEDIATELY: *FEVER GREATER THAN 100.4 F (38 C) OR HIGHER *CHILLS OR SWEATING *NAUSEA AND VOMITING THAT IS NOT CONTROLLED WITH YOUR NAUSEA MEDICATION *UNUSUAL SHORTNESS OF BREATH *UNUSUAL BRUISING OR BLEEDING *URINARY PROBLEMS (pain or burning when urinating, or frequent urination) *BOWEL PROBLEMS (unusual diarrhea, constipation, pain near the anus) TENDERNESS IN MOUTH AND THROAT WITH OR WITHOUT PRESENCE OF ULCERS (sore throat, sores in mouth, or a toothache) UNUSUAL RASH, SWELLING OR PAIN  UNUSUAL VAGINAL DISCHARGE OR ITCHING   Items with * indicate a potential emergency and should be followed up as soon as possible or go to the Emergency Department if any problems should occur.  Please show the CHEMOTHERAPY ALERT CARD or IMMUNOTHERAPY ALERT CARD at check-in to the Emergency  Department and triage nurse.  Should you have questions after your visit or need to cancel or reschedule your appointment, please contact [facility] [phone]  and follow the prompts.  Office hours are 8:00 a.m. to 4:30 p.m. Monday - Friday. Please note that voicemails left after 4:00 p.m. may not be returned until the following business day.  We are closed weekends and major holidays. You have access to a nurse at all times for urgent questions. Please call the main number to the clinic [phone] and follow the prompts.  For any non-urgent questions, you may also contact your provider using MyChart. We now offer e-Visits for anyone 18 and older to request care online for non-urgent symptoms. For details visit [email or web address].   Also download the MyChart app! Go to the app store, search "MyChart", open the app, select , and log in with your MyChart username and password.   

## 2022-11-21 NOTE — Progress Notes (Signed)
Patient presents today for iron infusion. Patient is in satisfactory condition with no new complaints voiced.  Vital signs are stable.  We will proceed with infusion per provider orders.    Peripheral IV started with good blood return pre and post infusion.  Feraheme 510 mg given today per MD orders. Tolerated infusion without adverse affects. Vital signs stable. No complaints at this time. Discharged from clinic via wheelchair in stable condition. Alert and oriented x 3. F/U with [facility] as scheduled.   

## 2022-11-28 ENCOUNTER — Inpatient Hospital Stay: Payer: Medicare Other

## 2022-11-28 VITALS — BP 129/56 | HR 72 | Temp 98.4°F | Resp 18

## 2022-11-28 DIAGNOSIS — I82432 Acute embolism and thrombosis of left popliteal vein: Secondary | ICD-10-CM | POA: Diagnosis not present

## 2022-11-28 DIAGNOSIS — D500 Iron deficiency anemia secondary to blood loss (chronic): Secondary | ICD-10-CM

## 2022-11-28 LAB — CBC
HCT: 29.7 % — ABNORMAL LOW (ref 36.0–46.0)
Hemoglobin: 8.8 g/dL — ABNORMAL LOW (ref 12.0–15.0)
MCH: 27.2 pg (ref 26.0–34.0)
MCHC: 29.6 g/dL — ABNORMAL LOW (ref 30.0–36.0)
MCV: 92 fL (ref 80.0–100.0)
Platelets: 344 10*3/uL (ref 150–400)
RBC: 3.23 MIL/uL — ABNORMAL LOW (ref 3.87–5.11)
RDW: 20.9 % — ABNORMAL HIGH (ref 11.5–15.5)
WBC: 8.7 10*3/uL (ref 4.0–10.5)
nRBC: 0 % (ref 0.0–0.2)

## 2022-11-28 LAB — SAMPLE TO BLOOD BANK

## 2022-11-28 MED ORDER — SODIUM CHLORIDE 0.9 % IV SOLN
510.0000 mg | Freq: Once | INTRAVENOUS | Status: AC
  Administered 2022-11-28: 510 mg via INTRAVENOUS
  Filled 2022-11-28: qty 17

## 2022-11-28 MED ORDER — ACETAMINOPHEN 325 MG PO TABS
650.0000 mg | ORAL_TABLET | Freq: Once | ORAL | Status: AC
  Administered 2022-11-28: 650 mg via ORAL
  Filled 2022-11-28: qty 2

## 2022-11-28 MED ORDER — CETIRIZINE HCL 10 MG PO TABS
10.0000 mg | ORAL_TABLET | Freq: Once | ORAL | Status: AC
  Administered 2022-11-28: 10 mg via ORAL
  Filled 2022-11-28: qty 1

## 2022-11-28 MED ORDER — FAMOTIDINE IN NACL 20-0.9 MG/50ML-% IV SOLN
20.0000 mg | Freq: Once | INTRAVENOUS | Status: AC
  Administered 2022-11-28: 20 mg via INTRAVENOUS
  Filled 2022-11-28: qty 50

## 2022-11-28 MED ORDER — SODIUM CHLORIDE 0.9 % IV SOLN: Freq: Once | INTRAVENOUS | Status: AC

## 2022-11-28 NOTE — Patient Instructions (Signed)
[facility]  Discharge Instructions: Thank you for choosing [facility] to provide your oncology and hematology care.  If you have a lab appointment with the Cancer Center - please note that after April 8th, 2024, all labs will be drawn in the cancer center.  You do not have to check in or register with the main entrance as you have in the past but will complete your check-in in the cancer center.  Wear comfortable clothing and clothing appropriate for easy access to any Portacath or PICC line.   We strive to give you quality time with your provider. You may need to reschedule your appointment if you arrive late (15 or more minutes).  Arriving late affects you and other patients whose appointments are after yours.  Also, if you miss three or more appointments without notifying the office, you may be dismissed from the clinic at the provider's discretion.      For prescription refill requests, have your pharmacy contact our office and allow 72 hours for refills to be completed.    Today you received the following chemotherapy and/or immunotherapy agents Feraheme. Ferumoxytol Injection What is this medication? FERUMOXYTOL (FER ue MOX i tol) treats low levels of iron in your body (iron deficiency anemia). Iron is a mineral that plays an important role in making red blood cells, which carry oxygen from your lungs to the rest of your body. This medicine may be used for other purposes; ask your health care provider or pharmacist if you have questions. COMMON BRAND NAME(S): Feraheme What should I tell my care team before I take this medication? They need to know if you have any of these conditions: Anemia not caused by low iron levels High levels of iron in the blood Magnetic resonance imaging (MRI) test scheduled An unusual or allergic reaction to iron, other medications, foods, dyes, or preservatives Pregnant or trying to get pregnant Breastfeeding How should I  use this medication? This medication is injected into a vein. It is given by your care team in a hospital or clinic setting. Talk to your care team the use of this medication in children. Special care may be needed. Overdosage: If you think you have taken too much of this medicine contact a poison control center or emergency room at once. NOTE: This medicine is only for you. Do not share this medicine with others. What if I miss a dose? It is important not to miss your dose. Call your care team if you are unable to keep an appointment. What may interact with this medication? Other iron products This list may not describe all possible interactions. Give your health care provider a list of all the medicines, herbs, non-prescription drugs, or dietary supplements you use. Also tell them if you smoke, drink alcohol, or use illegal drugs. Some items may interact with your medicine. What should I watch for while using this medication? Visit your care team regularly. Tell your care team if your symptoms do not start to get better or if they get worse. You may need blood work done while you are taking this medication. You may need to follow a special diet. Talk to your care team. Foods that contain iron include: whole grains/cereals, dried fruits, beans, or peas, leafy green vegetables, and organ meats (liver, kidney). What side effects may I notice from receiving this medication? Side effects that you should report to your care team as soon as possible: Allergic reactions--skin rash, itching, hives, swelling   of the face, lips, tongue, or throat Low blood pressure--dizziness, feeling faint or lightheaded, blurry vision Shortness of breath Side effects that usually do not require medical attention (report to your care team if they continue or are bothersome): Flushing Headache Joint pain Muscle pain Nausea Pain, redness, or irritation at injection site This list may not describe all possible side  effects. Call your doctor for medical advice about side effects. You may report side effects to FDA at 1-800-FDA-1088. Where should I keep my medication? This medication is given in a hospital or clinic and will not be stored at home. NOTE: This sheet is a summary. It may not cover all possible information. If you have questions about this medicine, talk to your doctor, pharmacist, or health care provider.  2024 Elsevier/Gold Standard (2021-11-29 00:00:00)       To help prevent nausea and vomiting after your treatment, we encourage you to take your nausea medication as directed.  BELOW ARE SYMPTOMS THAT SHOULD BE REPORTED IMMEDIATELY: *FEVER GREATER THAN 100.4 F (38 C) OR HIGHER *CHILLS OR SWEATING *NAUSEA AND VOMITING THAT IS NOT CONTROLLED WITH YOUR NAUSEA MEDICATION *UNUSUAL SHORTNESS OF BREATH *UNUSUAL BRUISING OR BLEEDING *URINARY PROBLEMS (pain or burning when urinating, or frequent urination) *BOWEL PROBLEMS (unusual diarrhea, constipation, pain near the anus) TENDERNESS IN MOUTH AND THROAT WITH OR WITHOUT PRESENCE OF ULCERS (sore throat, sores in mouth, or a toothache) UNUSUAL RASH, SWELLING OR PAIN  UNUSUAL VAGINAL DISCHARGE OR ITCHING   Items with * indicate a potential emergency and should be followed up as soon as possible or go to the Emergency Department if any problems should occur.  Please show the CHEMOTHERAPY ALERT CARD or IMMUNOTHERAPY ALERT CARD at check-in to the Emergency Department and triage nurse.  Should you have questions after your visit or need to cancel or reschedule your appointment, please contact [facility] [phone]  and follow the prompts.  Office hours are 8:00 a.m. to 4:30 p.m. Monday - Friday. Please note that voicemails left after 4:00 p.m. may not be returned until the following business day.  We are closed weekends and major holidays. You have access to a nurse at all times for urgent questions. Please call the main number  to the clinic [phone] and follow the prompts.  For any non-urgent questions, you may also contact your provider using MyChart. We now offer e-Visits for anyone 18 and older to request care online for non-urgent symptoms. For details visit [email or web address].   Also download the MyChart app! Go to the app store, search "MyChart", open the app, select [facility], and log in with your MyChart username and password.   

## 2022-11-28 NOTE — Progress Notes (Signed)
Patient presents today for 1st Feraheme iron infusion. Vital signs stable. Patient denies any significant changes since her last visit. MAR reviewed and updated.   Feraheme given today per MD orders. Tolerated infusion without adverse affects. Vital signs stable. No complaints at this time. Discharged from clinic ambulatory in stable condition. Alert and oriented x 3. F/U with [facility] as scheduled.

## 2022-11-29 ENCOUNTER — Encounter: Payer: Self-pay | Admitting: Internal Medicine

## 2022-11-29 ENCOUNTER — Non-Acute Institutional Stay (SKILLED_NURSING_FACILITY): Payer: Medicare Other | Admitting: Internal Medicine

## 2022-11-29 DIAGNOSIS — R29818 Other symptoms and signs involving the nervous system: Secondary | ICD-10-CM

## 2022-11-29 DIAGNOSIS — R4189 Other symptoms and signs involving cognitive functions and awareness: Secondary | ICD-10-CM

## 2022-11-29 DIAGNOSIS — K0882 Secondary occlusal trauma: Secondary | ICD-10-CM

## 2022-11-29 DIAGNOSIS — K0530 Chronic periodontitis, unspecified: Secondary | ICD-10-CM | POA: Diagnosis not present

## 2022-11-29 DIAGNOSIS — I824Y2 Acute embolism and thrombosis of unspecified deep veins of left proximal lower extremity: Secondary | ICD-10-CM

## 2022-11-29 DIAGNOSIS — D500 Iron deficiency anemia secondary to blood loss (chronic): Secondary | ICD-10-CM

## 2022-11-29 NOTE — Progress Notes (Addendum)
NURSING HOME LOCATION:  [facility] ROOM NUMBER: [id number] P  CODE STATUS: Full Code  PCP: [name], MD  This is a nursing facility follow up visit for specific acute issue of possible intra oral abscess.  Interim medical record and care since last SNF visit was updated with review of diagnostic studies and change in clinical status since last visit were documented.  HPI: Staff reports low-grade temp up to 100.4 and swelling of the left jaw.  On exam staff reports possible left maxillary gum "pus pocket." She has been receiving iron infusions (IV Feraheme x 3) for iron deficiency anemia due to chronic blood loss through Dr. Marice Potter office.  On 6/3 iron level was 19.  On 6/24 H/H was 8.8/29.7 with an MCHC of 29.6 and normal MCV of 92.  Platelet count was normal at 344,000. 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 By history she has had a rash with sulfa and azithromycin and is intolerant or allergic to penicillin and nitrofurantoin. She is tentatively scheduled to be discharged 12/01/2022 from the SNF.  Review of systems: She stated she had pain while she was eating a meal approximately 48 hours ago.  As of last night she began to have swelling of the left jaw.  She denies signs or symptoms of upper respiratory tract infection.  She denies feeling feverish but rather states she is cold.  She also denies any active bleeding dyscrasias.  Constitutional: No  significant weight change, fatigue  Eyes: No redness, discharge, pain, vision change ENT/mouth: No nasal congestion,  purulent discharge, earache, change in hearing, sore throat  Cardiovascular: No chest pain, palpitations, paroxysmal nocturnal dyspnea  Respiratory: No cough, sputum production, hemoptysis, DOE Gastrointestinal: No heartburn, dysphagia, abdominal pain, nausea /vomiting, rectal bleeding, melena, change in bowels Genitourinary:  No dysuria, hematuria, pyuria, incontinence, nocturia Musculoskeletal: No joint stiffness, joint swelling, weakness, pain Dermatologic: No pruritus Neurologic: No dizziness, headache, syncope, seizures Psychiatric: No significant anxiety, depression, insomnia, anorexia Endocrine: No change in hair/nails, excessive thirst, excessive hunger, excessive urination  Hematologic/lymphatic: No significant bruising, lymphadenopathy, abnormal bleeding Allergy/immunology: No itchy/watery eyes, significant sneezing, urticaria, angioedema  Physical exam:  Pertinent or positive findings: The friend who is visiting her states that her neurocognitive issues have improved here at the SNF.  The patient did not remember meeting me upon her admission.  Facies tend to be blank.  Hair is thin.  Speech is hyponasal in quality and slightly slurred.  There is visible swelling of the left cheek and malar area with faint erythema.  This is most prominent below the left lower lid.  There is slight ecchymosis noted below the left corner of the mouth.  The left corner of mouth sags & the left nasolabial fold is decreased most likely related to the left cheek swelling.  The left upper maxillary gum reveals dark ecchymosis and swelling suggesting hematoma.  Slight S4 is noted.  Abdomen is protuberant.  She has 1+ edema at the left sock line and 1/2+ right sock line.  Pedal pulses are not palpable.  Interosseous wasting of the hands is present.  She has scattered ecchymoses over the dorsum of the hands.   General appearance:  no acute distress, increased work of breathing is present.   Lymphatic: No lymphadenopathy about the head, neck, axilla. Eyes: No conjunctival inflammation or lid edema is present. There is no scleral icterus. Ears:  External ear exam shows no significant lesions or deformities.   Nose:  External nasal examination shows no deformity or inflammation. Nasal mucosa are pink and moist without lesions,  exudates Oral exam:  There is no oropharyngeal erythema or exudate. Neck:  No thyromegaly, masses, tenderness noted.    Heart:  Normal rate and regular rhythm without gallop, murmur, click, rub .  Lungs: Chest clear to auscultation without wheezes, rhonchi, rales, rubs. Abdomen: Bowel sounds are normal. Abdomen is soft and nontender with no organomegaly, hernias, masses. GU: Deferred  Extremities:  No cyanosis, clubbing  Neurologic exam :Balance, Rhomberg, finger to nose testing could not be completed due to clinical state Skin: Warm & dry w/o tenting. No significant rash.  See summary under each active problem in the Problem List with associated updated therapeutic plan

## 2022-11-29 NOTE — Assessment & Plan Note (Addendum)
Dr. Ellin Saba is administrating IV iron (Feraheme x 3). 6/24 H/H 8.8/29.7; MCV 29.6 with normal MCV.  Platelet count and white count normal.  No differential was performed. She denies any new bleeding dyscrasias.  The periodontal lesion of the left maxillary gum is not actively bleeding but appears to be a localized hematoma.

## 2022-11-29 NOTE — Assessment & Plan Note (Signed)
Eliquis will be continued with monitor of the left maxillary gingival hematoma.

## 2022-11-29 NOTE — Patient Instructions (Signed)
See assessment and plan under each diagnosis in the problem list and acutely for this visit 

## 2022-11-29 NOTE — Assessment & Plan Note (Addendum)
She had no memory of having met me upon admission to the SNF.  I am not sure how much she comprehends about the complexity of the current issues.  She requested I [place].

## 2022-11-29 NOTE — Assessment & Plan Note (Addendum)
Based on Up To Date review metronidazole 500 mg every 8 hours will be prescribed for the next 7 days with monitor of the gingival changes.  She will be placed on a toothless soft diet to minimize trauma to this area. Clinically abscess is not present.  I&D of such would be problematic in the context of Eliquis anticoagulation and profound anemia.  Such intervention would require inpatient management.

## 2022-11-30 ENCOUNTER — Non-Acute Institutional Stay (SKILLED_NURSING_FACILITY): Payer: Medicare Other | Admitting: Internal Medicine

## 2022-11-30 DIAGNOSIS — R29818 Other symptoms and signs involving the nervous system: Secondary | ICD-10-CM | POA: Diagnosis not present

## 2022-11-30 DIAGNOSIS — D62 Acute posthemorrhagic anemia: Secondary | ICD-10-CM

## 2022-11-30 DIAGNOSIS — R4189 Other symptoms and signs involving cognitive functions and awareness: Secondary | ICD-10-CM

## 2022-11-30 DIAGNOSIS — I82412 Acute embolism and thrombosis of left femoral vein: Secondary | ICD-10-CM

## 2022-11-30 DIAGNOSIS — K0530 Chronic periodontitis, unspecified: Secondary | ICD-10-CM | POA: Diagnosis not present

## 2022-11-30 DIAGNOSIS — K0882 Secondary occlusal trauma: Secondary | ICD-10-CM

## 2022-11-30 NOTE — Assessment & Plan Note (Signed)
S/P Feraheme infusion x 3 from Dr. Ellin Saba.  11/07/2022 serum iron 19.  Current H/H 8.8/29.7. Etiology of any GI bleed has not been determined as incomplete prep prevented satisfactory EGD initially and patient declined repeat study.

## 2022-11-30 NOTE — Assessment & Plan Note (Addendum)
Hematoma above the third tooth lateral from the left maxillary incisor appears smaller without active bleeding.  Facial swelling and erythema is clinically improved on the metronidazole which will be continued for a total of 7 days. Her remaining pills should be transferred to [facility] to complete full course.

## 2022-11-30 NOTE — Assessment & Plan Note (Signed)
Eliquis will be continued unless there is significant bleeding necessitating its interruption.  Critical need to monitor for any bleeding dyscrasias discussed with the patient 11/30/2022.

## 2022-11-30 NOTE — Assessment & Plan Note (Signed)
When seen 6/25 she did not remember meeting me.  At today's exam she cannot remember my name.  She gave the date as "Wednesday, June,??,  2024."  She was able to identify the POTUS. Supervised care will be continued at [facility].

## 2022-11-30 NOTE — Progress Notes (Unsigned)
NURSING HOME LOCATION:  [facility] ROOM NUMBER:  133P  CODE STATUS: Full code  PCP:  [name] MD  This is a nursing facility follow up visit for tentative discharge to [facility] 12/01/2022.  HPI: She has been at [facility] since 10/27/2022 for rehab following a hospitalization 5/18 - 5/23 with new acute left lower extremity DVT.The DVT was in the context of recent left Achilles tendon injury for which she was wearing a boot.  Additionally she had a remote history of left lower extremity DVTs.  Doppler had revealed new acute deep venous thrombosis in the left popliteal, posterior tibial, and left peroneal veins. The patient had seen GI for heme positive stools in the context of GERD.  EGD attempt was incomplete 4/27 due to poor prep.  Patient declined any further endoluminal evaluation.  She was placed on PPI twice daily with subsequent outpatient follow-up to be scheduled. 5/20 IVC filter was placed by Interventional Radiology.  Eliquis was initiated 5/22. The hospitalization was complicated by severe anemia with a nadir H/H of 6.5/23.1.  Post transfusion predischarge H/H was 8.8/29.0. PT/OT recommended SNF placement because of frailty with recurrent falls.  Prior to admission she had been living independently ambulating with a walker. At the time of admission to the SNF there was obvious clinical neurocognitive deficits.  She was unable to define the diagnoses which were made nor the recommendations. During the hospitalization. On 6/3 iron level was 19.  Since discharge from the hospital she has been receiving IV Feraheme from Dr. Ellin Saba.  On 6/24 H/H was 8.8/29.7.  MCHC was 29.6. 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  Because of multiple antibiotic allergies and intolerances ;metronidazole 500 mg 3 times daily x 7 days was initiated 6/25.  Review of systems:  Date given as  "Wednesday, June,??,  2024."  She was able to identify the POTUS.  She denies any active bleeding dyscrasias at this time.  The intraoral discomfort is subjectively better.  Constitutional: No fever, significant weight change, fatigue  Eyes: No redness, discharge, pain, vision change ENT/mouth: No nasal congestion,  purulent discharge, earache, change in hearing, sore throat  Cardiovascular: No chest pain, palpitations, paroxysmal nocturnal dyspnea, claudication, edema  Respiratory: No cough, sputum production, hemoptysis, DOE, significant snoring, apnea   Gastrointestinal: No heartburn, dysphagia, abdominal pain, nausea /vomiting, rectal bleeding, melena, change in bowels Genitourinary: No dysuria, hematuria, pyuria, incontinence, nocturia Musculoskeletal: No joint stiffness, joint swelling, weakness, pain Dermatologic: No rash, pruritus, change in appearance of skin Neurologic: No dizziness, headache, syncope, seizures, numbness, tingling Psychiatric: No significant anxiety, depression, insomnia, anorexia Endocrine: No change in hair/skin/nails, excessive thirst, excessive hunger, excessive urination  Hematologic/lymphatic: No significant bruising, lymphadenopathy, abnormal bleeding Allergy/immunology: No itchy/watery eyes, significant sneezing, urticaria, angioedema  Physical exam:  Pertinent or positive findings: She appears her age and somewhat chronically ill.  Hair is thin over the crown.  The left nasolabial fold is decreased.  There is very faint erythema and swelling of the left infraorbital and malar area.  There is a hematoma above the third tooth from the left anterior incisor.  Clinically this does appear smaller than on exam 6/25.  First heart sound is increased.  There is a systolic basilar murmur.  She has minor low-grade rales at the bases.  Abdomen is protuberant.  She has 1+ edema left lower extremity and 1/2+ of the right lower extremity.  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  She has interosseous wasting of the hands.  There is an elliptical bruise at the base of the right index finger.  She has a flexion type tremor of the left hand.  General appearance: no acute distress, increased work of breathing is present.   Lymphatic: No lymphadenopathy about the head, neck, axilla. Eyes: No conjunctival inflammation or lid edema is present. There is no scleral icterus. Ears:  External ear exam shows no significant lesions or deformities.   Nose:  External nasal examination shows no deformity or inflammation. Nasal mucosa are pink and moist without lesions, exudates Oral exam: There is no oropharyngeal erythema or exudate. Neck:  No thyromegaly, masses, tenderness noted.    Heart:  Normal rate and regular rhythm.  S2 normal . No gallop, click, rub .  Lungs:  without wheezes, rhonchi, rubs. Abdomen: Bowel sounds are normal. Abdomen is soft and nontender with no organomegaly, hernias, masses. GU: Deferred  Extremities:  No cyanosis, clubbing  Neurologic exam :Balance, Rhomberg, finger to nose testing could not be completed due to clinical state Skin: Warm & dry w/o tenting. No significant rash.  See summary under each active problem in the Problem List with associated updated therapeutic plan

## 2022-11-30 NOTE — Patient Instructions (Addendum)
See assessment and plan under each diagnosis in the problem list and acutely for this visit She is to be discharged to [facility] 12/01/2022. She is to complete a 7-day course of antibiotics for the periodontitis which has improved clinically from 6/25.  She was to report any bleeding 9 nosebleeds, gum bleeding, coughing up blood, bleeding from the rectum, or black/tarry stools). You must not take any anti-inflammatory medications such as aspirin, ibuprofen, naproxen, or diclofenac.  These could cause life-threatening gastrointestinal bleeding.

## 2022-12-01 ENCOUNTER — Encounter: Payer: Self-pay | Admitting: Internal Medicine

## 2022-12-02 NOTE — Progress Notes (Signed)
Pt scheduled to see Hyacinth Meeker PA 12/09/22 at 1:30pm.

## 2022-12-02 NOTE — Progress Notes (Signed)
Pt is now at [facility] in [place]. Spoke with facility and left message with med tech that pt has been schedlued to see Hyacinth Meeker 12/09/22 at 1:30pm.

## 2022-12-03 ENCOUNTER — Inpatient Hospital Stay (HOSPITAL_COMMUNITY)
Admission: EM | Admit: 2022-12-03 | Discharge: 2022-12-07 | DRG: 603 | Disposition: A | Discharge status: Skilled Nursing Facility | Payer: Medicare Other | Attending: Family Medicine | Admitting: Family Medicine

## 2022-12-03 ENCOUNTER — Emergency Department (HOSPITAL_COMMUNITY): Payer: Medicare Other

## 2022-12-03 ENCOUNTER — Other Ambulatory Visit: Payer: Self-pay

## 2022-12-03 ENCOUNTER — Encounter (HOSPITAL_COMMUNITY): Payer: Self-pay

## 2022-12-03 DIAGNOSIS — Z79899 Other long term (current) drug therapy: Secondary | ICD-10-CM

## 2022-12-03 DIAGNOSIS — I10 Essential (primary) hypertension: Secondary | ICD-10-CM | POA: Diagnosis present

## 2022-12-03 DIAGNOSIS — S8252XA Displaced fracture of medial malleolus of left tibia, initial encounter for closed fracture: Secondary | ICD-10-CM | POA: Diagnosis present

## 2022-12-03 DIAGNOSIS — L02416 Cutaneous abscess of left lower limb: Secondary | ICD-10-CM | POA: Diagnosis not present

## 2022-12-03 DIAGNOSIS — B9561 Methicillin susceptible Staphylococcus aureus infection as the cause of diseases classified elsewhere: Secondary | ICD-10-CM | POA: Diagnosis present

## 2022-12-03 DIAGNOSIS — Z86718 Personal history of other venous thrombosis and embolism: Secondary | ICD-10-CM | POA: Diagnosis not present

## 2022-12-03 DIAGNOSIS — Z79890 Hormone replacement therapy (postmenopausal): Secondary | ICD-10-CM

## 2022-12-03 DIAGNOSIS — K219 Gastro-esophageal reflux disease without esophagitis: Secondary | ICD-10-CM | POA: Diagnosis present

## 2022-12-03 DIAGNOSIS — Z9851 Tubal ligation status: Secondary | ICD-10-CM

## 2022-12-03 DIAGNOSIS — M797 Fibromyalgia: Secondary | ICD-10-CM | POA: Diagnosis present

## 2022-12-03 DIAGNOSIS — Z90710 Acquired absence of both cervix and uterus: Secondary | ICD-10-CM

## 2022-12-03 DIAGNOSIS — Z9049 Acquired absence of other specified parts of digestive tract: Secondary | ICD-10-CM

## 2022-12-03 DIAGNOSIS — Z884 Allergy status to anesthetic agent status: Secondary | ICD-10-CM | POA: Diagnosis not present

## 2022-12-03 DIAGNOSIS — M810 Age-related osteoporosis without current pathological fracture: Secondary | ICD-10-CM | POA: Diagnosis present

## 2022-12-03 DIAGNOSIS — X58XXXA Exposure to other specified factors, initial encounter: Secondary | ICD-10-CM | POA: Diagnosis present

## 2022-12-03 DIAGNOSIS — Z880 Allergy status to penicillin: Secondary | ICD-10-CM

## 2022-12-03 DIAGNOSIS — L0291 Cutaneous abscess, unspecified: Principal | ICD-10-CM

## 2022-12-03 DIAGNOSIS — I739 Peripheral vascular disease, unspecified: Secondary | ICD-10-CM | POA: Diagnosis present

## 2022-12-03 DIAGNOSIS — E039 Hypothyroidism, unspecified: Secondary | ICD-10-CM | POA: Diagnosis present

## 2022-12-03 DIAGNOSIS — F32A Depression, unspecified: Secondary | ICD-10-CM | POA: Diagnosis present

## 2022-12-03 DIAGNOSIS — Z882 Allergy status to sulfonamides status: Secondary | ICD-10-CM

## 2022-12-03 DIAGNOSIS — Z83719 Family history of colon polyps, unspecified: Secondary | ICD-10-CM

## 2022-12-03 DIAGNOSIS — L03116 Cellulitis of left lower limb: Secondary | ICD-10-CM | POA: Diagnosis present

## 2022-12-03 DIAGNOSIS — S92012A Displaced fracture of body of left calcaneus, initial encounter for closed fracture: Secondary | ICD-10-CM | POA: Diagnosis present

## 2022-12-03 DIAGNOSIS — Z95828 Presence of other vascular implants and grafts: Secondary | ICD-10-CM | POA: Diagnosis not present

## 2022-12-03 DIAGNOSIS — S92002A Unspecified fracture of left calcaneus, initial encounter for closed fracture: Secondary | ICD-10-CM

## 2022-12-03 DIAGNOSIS — Z883 Allergy status to other anti-infective agents status: Secondary | ICD-10-CM

## 2022-12-03 DIAGNOSIS — Z881 Allergy status to other antibiotic agents status: Secondary | ICD-10-CM | POA: Diagnosis not present

## 2022-12-03 DIAGNOSIS — Z86010 Personal history of colonic polyps: Secondary | ICD-10-CM

## 2022-12-03 DIAGNOSIS — Z87442 Personal history of urinary calculi: Secondary | ICD-10-CM | POA: Diagnosis not present

## 2022-12-03 DIAGNOSIS — Z7901 Long term (current) use of anticoagulants: Secondary | ICD-10-CM

## 2022-12-03 DIAGNOSIS — K589 Irritable bowel syndrome without diarrhea: Secondary | ICD-10-CM | POA: Diagnosis present

## 2022-12-03 DIAGNOSIS — Z888 Allergy status to other drugs, medicaments and biological substances status: Secondary | ICD-10-CM

## 2022-12-03 LAB — BASIC METABOLIC PANEL
Anion gap: 7 (ref 5–15)
BUN: 12 mg/dL (ref 8–23)
CO2: 26 mmol/L (ref 22–32)
Calcium: 7.9 mg/dL — ABNORMAL LOW (ref 8.9–10.3)
Chloride: 103 mmol/L (ref 98–111)
Creatinine, Ser: 0.6 mg/dL (ref 0.44–1.00)
GFR, Estimated: >60 mL/min (ref >60)
Glucose, Bld: 117 mg/dL — ABNORMAL HIGH (ref 70–99)
Potassium: 3.4 mmol/L — ABNORMAL LOW (ref 3.5–5.1)
Sodium: 136 mmol/L (ref 135–145)

## 2022-12-03 LAB — CBC WITH DIFFERENTIAL/PLATELET
Abs Immature Granulocytes: 0.03 10*3/uL (ref 0.00–0.07)
Basophils Absolute: 0.1 10*3/uL (ref 0.0–0.1)
Basophils Relative: 1 %
Eosinophils Absolute: 0.2 10*3/uL (ref 0.0–0.5)
Eosinophils Relative: 3 %
HCT: 27 % — ABNORMAL LOW (ref 36.0–46.0)
Hemoglobin: 8 g/dL — ABNORMAL LOW (ref 12.0–15.0)
Immature Granulocytes: 0 %
Lymphocytes Relative: 26 %
Lymphs Abs: 2.1 10*3/uL (ref 0.7–4.0)
MCH: 28.2 pg (ref 26.0–34.0)
MCHC: 29.6 g/dL — ABNORMAL LOW (ref 30.0–36.0)
MCV: 95.1 fL (ref 80.0–100.0)
Monocytes Absolute: 1.1 10*3/uL — ABNORMAL HIGH (ref 0.1–1.0)
Monocytes Relative: 14 %
Neutro Abs: 4.5 10*3/uL (ref 1.7–7.7)
Neutrophils Relative %: 56 %
Platelets: 296 10*3/uL (ref 150–400)
RBC: 2.84 MIL/uL — ABNORMAL LOW (ref 3.87–5.11)
RDW: 23.5 % — ABNORMAL HIGH (ref 11.5–15.5)
WBC: 8 10*3/uL (ref 4.0–10.5)
nRBC: 0 % (ref 0.0–0.2)

## 2022-12-03 LAB — D-DIMER, QUANTITATIVE: D-Dimer, Quant: 0.62 ug/mL-FEU — ABNORMAL HIGH (ref 0.00–0.50)

## 2022-12-03 LAB — AEROBIC CULTURE W GRAM STAIN (SUPERFICIAL SPECIMEN): Gram Stain: NONE SEEN

## 2022-12-03 LAB — LACTIC ACID, PLASMA
Lactic Acid, Venous: 0.9 mmol/L (ref 0.5–1.9)
Lactic Acid, Venous: 0.7 mmol/L (ref 0.5–1.9)

## 2022-12-03 LAB — MRSA NEXT GEN BY PCR, NASAL: MRSA by PCR Next Gen: NOT DETECTED

## 2022-12-03 LAB — SEDIMENTATION RATE: Sed Rate: 55 mm/hr — ABNORMAL HIGH (ref 0–22)

## 2022-12-03 MED ORDER — POLYETHYLENE GLYCOL 3350 17 G PO PACK: 17.0000 g | PACK | Freq: Every day | ORAL | Status: DC | PRN

## 2022-12-03 MED ORDER — LEVOTHYROXINE SODIUM 50 MCG PO TABS
75.0000 ug | ORAL_TABLET | Freq: Every day | ORAL | Status: DC
  Administered 2022-12-04 – 2022-12-07 (×4): 75 ug via ORAL
  Filled 2022-12-03 (×4): qty 2

## 2022-12-03 MED ORDER — VANCOMYCIN HCL 1250 MG/250ML IV SOLN
1250.0000 mg | Freq: Once | INTRAVENOUS | Status: AC
  Administered 2022-12-03: 1250 mg via INTRAVENOUS
  Filled 2022-12-03: qty 250

## 2022-12-03 MED ORDER — OXYCODONE HCL 5 MG PO TABS
5.0000 mg | ORAL_TABLET | ORAL | Status: DC | PRN
  Administered 2022-12-04 – 2022-12-07 (×4): 5 mg via ORAL
  Filled 2022-12-03 (×4): qty 1

## 2022-12-03 MED ORDER — ONDANSETRON HCL 4 MG/2ML IJ SOLN: 4.0000 mg | Freq: Four times a day (QID) | INTRAMUSCULAR | Status: DC | PRN

## 2022-12-03 MED ORDER — LIDOCAINE-EPINEPHRINE (PF) 2 %-1:200000 IJ SOLN
10.0000 mL | Freq: Once | INTRAMUSCULAR | Status: AC
  Administered 2022-12-03: 10 mL
  Filled 2022-12-03: qty 20

## 2022-12-03 MED ORDER — PANTOPRAZOLE SODIUM 40 MG PO TBEC
40.0000 mg | DELAYED_RELEASE_TABLET | Freq: Two times a day (BID) | ORAL | Status: DC
  Administered 2022-12-03 – 2022-12-07 (×8): 40 mg via ORAL
  Filled 2022-12-03 (×8): qty 1

## 2022-12-03 MED ORDER — ESCITALOPRAM OXALATE 10 MG PO TABS
20.0000 mg | ORAL_TABLET | Freq: Every day | ORAL | Status: DC
  Administered 2022-12-04 – 2022-12-07 (×4): 20 mg via ORAL
  Filled 2022-12-03 (×4): qty 2

## 2022-12-03 MED ORDER — SODIUM CHLORIDE 0.9 % IV SOLN
1.0000 g | INTRAVENOUS | Status: DC
  Administered 2022-12-04: 1 g via INTRAVENOUS
  Filled 2022-12-03: qty 10

## 2022-12-03 MED ORDER — ONDANSETRON HCL 4 MG PO TABS: 4.0000 mg | ORAL_TABLET | Freq: Four times a day (QID) | ORAL | Status: DC | PRN

## 2022-12-03 MED ORDER — APIXABAN 5 MG PO TABS
5.0000 mg | ORAL_TABLET | Freq: Two times a day (BID) | ORAL | Status: DC
  Administered 2022-12-03 – 2022-12-07 (×8): 5 mg via ORAL
  Filled 2022-12-03 (×8): qty 1

## 2022-12-03 MED ORDER — VANCOMYCIN HCL IN DEXTROSE 1-5 GM/200ML-% IV SOLN
1000.0000 mg | INTRAVENOUS | Status: DC
  Administered 2022-12-04: 1000 mg via INTRAVENOUS
  Filled 2022-12-03: qty 200

## 2022-12-03 MED ORDER — ACETAMINOPHEN 500 MG PO TABS
1000.0000 mg | ORAL_TABLET | Freq: Once | ORAL | Status: AC
  Administered 2022-12-03: 1000 mg via ORAL
  Filled 2022-12-03: qty 2

## 2022-12-03 MED ORDER — SODIUM CHLORIDE 0.9 % IV SOLN
1.0000 g | Freq: Once | INTRAVENOUS | Status: AC
  Administered 2022-12-03: 1 g via INTRAVENOUS
  Filled 2022-12-03: qty 10

## 2022-12-03 NOTE — Progress Notes (Signed)
Patient ID: Stefanie [name], female   DOB: 1940/06/23, 82 y.o.   MRN: [id number]  Consultation requested by Dr. Particia Nearing from the emergency room  Chief complaint cellulitis skin superficial pustule/abscess, posterior process calcaneus fracture.  Pertinent medical history patient has had a recent deep vein thrombosis, she has a recent IVC filter, she is on Eliquis.  Peripheral vascular disease arterial and venous.  The patient was at a facility does not document trauma.    She was brought in for medial wound drainage and cellulitis  It is unclear when the calcaneus fracture occurred.  Imaging shows 2 suture anchors from remote Achilles tendon surgery (review of care everywhere indicates that the Achilles tendon surgery was many years ago at least prior to 2019)  In any event this is a very complex case with high risk for amputation.  Prior to surgical intervention if further workup proves necessary, the patient will need the following:  1 she will need vascular consult  2.  She will need foot and ankle specialist consult  3.  She will need medical optimization  Based on the x-ray of her calcaneus she has poor bone quality with osteoporosis and stable fixation of this fracture in the setting is very difficult if not impossible.  If her infection is not controlled she does risk losing her foot.  She does have the IVC filter in place which should help [place] in terms of recurrent blood clot that could lead to embolism.  For now I would continue her workup for in the infection and the antibiotics.

## 2022-12-03 NOTE — ED Provider Notes (Signed)
[place] EMERGENCY DEPARTMENT AT [facility] Provider Note   CSN: [id number] Arrival date & time: 12/03/22  1624     History  Chief Complaint  Patient presents with   Leg Injury    Stefanie [name] is a 82 y.o. female.  Pt is a 82 yo female with pmhx significant for hypothyroidism, depression, gerd, arthritis, fibromyalgia, kidney stones, and anxiety.  Pt has developed some redness and swelling to the LLE.  She had a sore open up yesterday.  The facility did an xray which showed a possible fx.  She denies any trauma.  She is on Flagyl on her MAR, but it is unclear why.  No fevers.       Home Medications Prior to Admission medications   Medication Sig Start Date End Date Taking? Authorizing Provider  acetaminophen (TYLENOL) 500 MG tablet Take 2 tablets (1,000 mg total) by mouth every 6 (six) hours as needed for moderate pain. 10/01/22   Lewie Chamber, MD  apixaban (ELIQUIS) 5 MG TABS tablet 2 po BID thru 11/01/22 then 1 po BID 11/02/22   Johnson, Clanford L, MD  escitalopram (LEXAPRO) 20 MG tablet Take 20 mg by mouth daily.    [provider]  fexofenadine (ALLEGRA) 60 MG tablet Take 1 tablet (60 mg total) by mouth 2 (two) times daily as needed for allergies or rhinitis. 10/27/22   Johnson, Clanford L, MD  levothyroxine (SYNTHROID, LEVOTHROID) 75 MCG tablet Take 75 mcg by mouth at bedtime.    [provider]  pantoprazole (PROTONIX) 40 MG tablet Take 1 tablet (40 mg total) by mouth 2 (two) times daily. 10/01/22 10/01/23  Lewie Chamber, MD  polyethylene glycol (MIRALAX / GLYCOLAX) 17 g packet Take 17 g by mouth daily. 10/28/22   Johnson, Clanford L, MD  potassium chloride (KLOR-CON) 10 MEQ tablet Take 10 mEq by mouth daily. 08/17/22   [provider]  Vitamin D, Ergocalciferol, (DRISDOL) 1.25 MG (50000 UNIT) CAPS capsule Take 50,000 Units by mouth every 7 (seven) days. 10/05/22   [provider]      Allergies    Gabapentin, Lidocaine,  Metoclopramide, Other, Sulfa antibiotics, Z-pak [azithromycin], Metoclopramide hcl, Penicillins, Procaine hcl, and Macrodantin [nitrofurantoin macrocrystal]    Review of Systems   Review of Systems  Skin:  Positive for rash and wound.  All other systems reviewed and are negative.   Physical Exam Updated Vital Signs BP (!) 114/49   Pulse 76   Temp 98 F (36.7 C) (Oral)   Resp 18   Ht 5\' 4"  (1.626 m)   Wt 67.6 kg   SpO2 96%   BMI 25.58 kg/m  Physical Exam Vitals and nursing note reviewed.  Constitutional:      Appearance: Normal appearance.  HENT:     Head: Normocephalic and atraumatic.     Right Ear: External ear normal.     Left Ear: External ear normal.     Nose: Nose normal.     Mouth/Throat:     Mouth: Mucous membranes are moist.     Pharynx: Oropharynx is clear.  Eyes:     Extraocular Movements: Extraocular movements intact.     Conjunctiva/sclera: Conjunctivae normal.     Pupils: Pupils are equal, round, and reactive to light.  Cardiovascular:     Rate and Rhythm: Normal rate and regular rhythm.     Pulses: Normal pulses.     Heart sounds: Normal heart sounds.  Pulmonary:     Effort: Pulmonary effort is  normal.     Breath sounds: Normal breath sounds.  Abdominal:     General: Abdomen is flat. Bowel sounds are normal.     Palpations: Abdomen is soft.  Musculoskeletal:     Cervical back: Normal range of motion and neck supple.     Left lower leg: Edema present.  Skin:    General: Skin is warm.     Capillary Refill: Capillary refill takes less than 2 seconds.     Comments: Cellulitis to LLE with small abscess to medial malleolus  Neurological:     General: No focal deficit present.     Mental Status: She is alert and oriented to person, place, and time.  Psychiatric:        Mood and Affect: Mood normal.        Behavior: Behavior normal.     ED Results / Procedures / Treatments   Labs (all labs ordered are listed, but only abnormal results are  displayed) Labs Reviewed  BASIC METABOLIC PANEL - Abnormal; Notable for the following components:      Result Value   Potassium 3.4 (*)    Glucose, Bld 117 (*)    Calcium 7.9 (*)    All other components within normal limits  CBC WITH DIFFERENTIAL/PLATELET - Abnormal; Notable for the following components:   RBC 2.84 (*)    Hemoglobin 8.0 (*)    HCT 27.0 (*)    MCHC 29.6 (*)    RDW 23.5 (*)    Monocytes Absolute 1.1 (*)    All other components within normal limits  D-DIMER, QUANTITATIVE - Abnormal; Notable for the following components:   D-Dimer, Quant 0.62 (*)    All other components within normal limits  AEROBIC CULTURE W GRAM STAIN (SUPERFICIAL SPECIMEN)  LACTIC ACID, PLASMA  LACTIC ACID, PLASMA    EKG None  Radiology DG Ankle Complete Left  Result Date: 12/03/2022 CLINICAL DATA:  Wound. Facility states patient has an open area to the inside of the left ankle with concern for cellulitis. EXAM: LEFT ANKLE COMPLETE - 3+ VIEW COMPARISON:  Radiograph 10/22/2022, 08/27/2022 FINDINGS: Acute moderately displaced fracture of the posterior process of the calcaneus. The fracture fragment is displaced approximately 1.5 cm superiorly. Bone anchor is noted inferior to the fracture line. Diffuse osteopenia. Material external to the patient, possibly the patient's sock, obscures fine osseous and soft tissue detail. No subcutaneous emphysema. No radiopaque foreign body in the soft tissues. IMPRESSION: 1. Acute moderately displaced avulsion fracture of the posterior process of the calcaneus. 2. No subcutaneous emphysema to correlate with reported wound though fine soft tissue detail is obscured by material overlying the midfoot to forefoot. Electronically Signed   By: Sherron Ales M.D.   On: 12/03/2022 19:14    Procedures .Marland KitchenIncision and Drainage  Date/Time: 12/03/2022 6:13 PM  Performed by: Jacalyn Lefevre, MD Authorized by: Jacalyn Lefevre, MD   Consent:    Consent obtained:  Verbal    Consent given by:  Patient   Risks discussed:  Bleeding, incomplete drainage and pain Universal protocol:    Patient identity confirmed:  Verbally with patient Location:    Type:  Abscess   Size:  1   Location:  Lower extremity Pre-procedure details:    Skin preparation:  Chlorhexidine Sedation:    Sedation type:  None Anesthesia:    Anesthesia method:  Local infiltration   Local anesthetic:  Lidocaine 2% WITH epi Procedure type:    Complexity:  Simple Procedure details:  Ultrasound guidance: no     Needle aspiration: no     Incision types:  Single straight   Wound management:  Probed and deloculated   Drainage:  Purulent   Drainage amount:  Copious   Packing materials:  None Post-procedure details:    Procedure completion:  Tolerated well, no immediate complications Comments:     Pt's abscess had already opened.  I just opened it some more and drained some additional pus.     Medications Ordered in ED Medications  cefTRIAXone (ROCEPHIN) 1 g in sodium chloride 0.9 % 100 mL IVPB (0 g Intravenous Stopped 12/03/22 1759)  acetaminophen (TYLENOL) tablet 1,000 mg (1,000 mg Oral Given 12/03/22 1726)  lidocaine-EPINEPHrine (XYLOCAINE W/EPI) 2 %-1:200000 (PF) injection 10 mL (10 mLs Infiltration Given 12/03/22 1807)    ED Course/ Medical Decision Making/ A&P                             Medical Decision Making Amount and/or Complexity of Data Reviewed Labs: ordered. Radiology: ordered.  Risk OTC drugs. Prescription drug management. Decision regarding hospitalization.   This patient presents to the ED for concern of cellulitis, this involves an extensive number of treatment options, and is a complaint that carries with it a high risk of complications and morbidity.  The differential diagnosis includes cellulitis, abscess, fx, dvt   Co morbidities that complicate the patient evaluation  hypothyroidism, depression, gerd, arthritis, fibromyalgia, kidney stones, and  anxiety   Additional history obtained:  Additional history obtained from epic chart review External records from outside source obtained and reviewed including EMS report   Lab Tests:  I Ordered, and personally interpreted labs.  The pertinent results include:  cbc with chronic anemia (hgb 8), ddimer (age adjusted is nl), bmp nl   Imaging Studies ordered:  I ordered imaging studies including left ankle  I independently visualized and interpreted imaging which showed  Acute moderately displaced avulsion fracture of the posterior  process of the calcaneus.  2. No subcutaneous emphysema to correlate with reported wound though  fine soft tissue detail is obscured by material overlying the  midfoot to forefoot.   I agree with the radiologist interpretation   Cardiac Monitoring:  The patient was maintained on a cardiac monitor.  I personally viewed and interpreted the cardiac monitored which showed an underlying rhythm of: nsr   Medicines ordered and prescription drug management:  I ordered medication including rocephin  for sx  Reevaluation of the patient after these medicines showed that the patient improved I have reviewed the patients home medicines and have made adjustments as needed   Test Considered:  Ct/mri   Critical Interventions:  Iv abx   Consultations Obtained:  I requested consultation with the orthopedist (Dr. Romeo Apple),  and discussed lab and imaging findings as well as pertinent plan - he recommends admission to medicine for iv abx.  He will see pt in the am.  MRI can wait until Monday. Pt d/w Dr. Adrian Blackwater ([facility]) for admission.   Problem List / ED Course:  Calcaneus fx:  unclear how long this has been there. Pt does not recall a trauma. Abscess/cellulitis:  iv abx (rocephin/vanc).  Wound cx sent.    Reevaluation:  After the interventions noted above, I reevaluated the patient and found that they have :improved   Social Determinants of  Health:  Lives in a facility   Dispostion:  After consideration of the diagnostic results and the  patients response to treatment, I feel that the patent would benefit from admission.          Final Clinical Impression(s) / ED Diagnoses Final diagnoses:  Abscess  Cellulitis of left lower extremity  Closed displaced fracture of body of left calcaneus, initial encounter    Rx / DC Orders ED Discharge Orders     None         Jacalyn Lefevre, MD 12/03/22 1952

## 2022-12-03 NOTE — ED Triage Notes (Signed)
Pt bib [facility] from [facility] in [place]. Facility states pt has an open area to the inside of left ankle, they was concerned about cellulitis, x-ray was done last night and resulted back "acute to subacute avulsion fx of calcaneal tuberosity w/ 2.5 cm displaced fragment." Facility and pt denies fall or hitting of that leg or ankle. Pt does have an open area that is draining, redness around the area.

## 2022-12-03 NOTE — Progress Notes (Signed)
Pharmacy Antibiotic Note  Stefanie [name] is a 82 y.o. female admitted on 12/03/2022 with Cellulitis.  Pharmacy has been consulted for Vancomycin dosing.  CrCl 50-60 mL/min  Plan: Vancomycin 1250mg  IV once then 1000mg  Q24H (eaUC 472, Scr rounded up to 0.8, Vd 0.72) CTX 1g Q24H per MD  Height: 5\' 4"  (162.6 cm) Weight: 67.6 kg (149 lb) IBW/kg (Calculated) : 54.7  Temp (24hrs), Avg:98.1 F (36.7 C), Min:98 F (36.7 C), Max:98.2 F (36.8 C)  Recent Labs  Lab 11/28/22 1113 12/03/22 1718 12/03/22 1851  WBC 8.7 8.0  --   CREATININE  --  0.60  --   LATICACIDVEN  --  0.9 0.7    Estimated Creatinine Clearance: 51.3 mL/min (by C-G formula based on SCr of 0.6 mg/dL).    Allergies  Allergen Reactions   Gabapentin      Dizziness. States made her feel like she was drunk. Unsteady gait and dizziness.         Lidocaine Rash   Metoclopramide Rash   Other Other (See Comments)    DYE+Nitrofurantoin+Brilliant Blue Fcf   Sulfa Antibiotics Rash   Z-Pak [Azithromycin] Rash   Metoclopramide Hcl Other (See Comments)    REACTION: a drawing of all her muscles   Penicillins Other (See Comments)    Unknown    Procaine Hcl Other (See Comments)    Unknown    Macrodantin [Nitrofurantoin Macrocrystal] Rash    Thank you for allowing pharmacy to be a part of this patient's care.  Ellis Savage, PharmD Clinical Pharmacist 12/03/2022 8:28 PM

## 2022-12-03 NOTE — H&P (Signed)
History and Physical    Patient: Stefanie [name] KGM:[id number] DOB: 01-19-1941 DOA: 12/03/2022 DOS: the patient was seen and examined on 12/03/2022 PCP: [name], MD  Patient coming from: ALF/ILF  Chief Complaint:  Chief Complaint  Patient presents with   Leg Injury   HPI: Stefanie [name] is a 82 y.o. female with medical history significant of hypothyroidism, fibromyalgia, GERD, diverticulosis, IBS, history of DVT, anemia of chronic disease.  Patient presents with redness and swelling to the left lower extremity.  This was first noticed yesterday with a sore that started to open up.  The facility where she lives did a x-ray which showed a possible fracture, so she was sent here for evaluation.  She denies fevers, chills, nausea, vomiting.  Per the patient's granddaughter, the patient has been having trouble with her left ankle for several months.  She first injured her ankle as a sprain.  She has had several x-rays, none of which have seen the fracture.  Review of Systems: As mentioned in the history of present illness. All other systems reviewed and are negative. Past Medical History:  Diagnosis Date   Anxiety    Arthritis    Colon polyps    Depression    Diverticulosis of colon (without mention of hemorrhage)    Fibromyalgia    GERD (gastroesophageal reflux disease)    Hiatal hernia    History of IBS    History of kidney stones    Hypothyroidism    Iron deficiency anemia due to chronic blood loss 11/15/2022   Past Surgical History:  Procedure Laterality Date   ABDOMINAL HYSTERECTOMY     ANKLE SURGERY     bilateral    APPENDECTOMY  1985   BREAST BIOPSY Left    Benign    CHOLECYSTECTOMY  1985   ESOPHAGEAL MANOMETRY N/A 10/22/2012   Procedure: ESOPHAGEAL MANOMETRY (EM);  Surgeon: Mardella Layman, MD;  Location: [facility] ENDOSCOPY;  Service: Endoscopy;  Laterality: N/A;   ESOPHAGOGASTRODUODENOSCOPY N/A 10/01/2022   Procedure: ESOPHAGOGASTRODUODENOSCOPY (EGD);  Surgeon: [name], MD;  Location[facility] ENDOSCOPY;  Service: Gastroenterology;  Laterality: N/A;   EXTRACORPOREAL SHOCK WAVE LITHOTRIPSY Left 11/26/2018   Procedure: EXTRACORPOREAL SHOCK WAVE LITHOTRIPSY (ESWL);  Surgeon: Marcine Matar, MD;  Location: [facility] ORS;  Service: Urology;  Laterality: Left;   HEMORRHOID SURGERY     IR IVC FILTER PLMT / S&I /IMG GUID/MOD SED  10/24/2022   RECTOCELE REPAIR     TUBAL LIGATION     Social History:  reports that she has never smoked. She has never used smokeless tobacco. She reports that she does not drink alcohol and does not use drugs.  Allergies  Allergen Reactions   Gabapentin      Dizziness. States made her feel like she was drunk. Unsteady gait and dizziness.         Lidocaine Rash   Metoclopramide Rash   Other Other (See Comments)    DYE+Nitrofurantoin+Brilliant Blue Fcf   Sulfa Antibiotics Rash   Z-Pak [Azithromycin] Rash   Metoclopramide Hcl Other (See Comments)    REACTION: a drawing of all her muscles   Penicillins Other (See Comments)    Unknown    Procaine Hcl Other (See Comments)    Unknown    Macrodantin [Nitrofurantoin Macrocrystal] Rash    Family History  Problem Relation Age of Onset   Colon polyps Brother    Breast cancer Neg Hx     Prior to Admission medications   Medication Sig Start Date End  Date Taking? Authorizing Provider  acetaminophen (TYLENOL) 500 MG tablet Take 2 tablets (1,000 mg total) by mouth every 6 (six) hours as needed for moderate pain. 10/01/22   Lewie Chamber, MD  apixaban (ELIQUIS) 5 MG TABS tablet 2 po BID thru 11/01/22 then 1 po BID 11/02/22   Johnson, Clanford L, MD  escitalopram (LEXAPRO) 20 MG tablet Take 20 mg by mouth daily.    [provider]  fexofenadine (ALLEGRA) 60 MG tablet Take 1 tablet (60 mg total) by mouth 2 (two) times daily as needed for allergies or rhinitis. 10/27/22   Johnson, Clanford L, MD  levothyroxine (SYNTHROID, LEVOTHROID) 75 MCG tablet Take 75 mcg by mouth at bedtime.     [provider]  pantoprazole (PROTONIX) 40 MG tablet Take 1 tablet (40 mg total) by mouth 2 (two) times daily. 10/01/22 10/01/23  Lewie Chamber, MD  polyethylene glycol (MIRALAX / GLYCOLAX) 17 g packet Take 17 g by mouth daily. 10/28/22   Johnson, Clanford L, MD  potassium chloride (KLOR-CON) 10 MEQ tablet Take 10 mEq by mouth daily. 08/17/22   [provider]  Vitamin D, Ergocalciferol, (DRISDOL) 1.25 MG (50000 UNIT) CAPS capsule Take 50,000 Units by mouth every 7 (seven) days. 10/05/22   [provider]    Physical Exam: Vitals:   12/03/22 1639 12/03/22 1745 12/03/22 1830 12/03/22 1915  BP: (!) 123/57 (!) 125/57 (!) 116/56 (!) 114/49  Pulse: 77 78 75 76  Resp: 20   18  Temp: 98 F (36.7 C)     TempSrc: Oral     SpO2: 94% 96% 93% 96%  Weight:      Height:       General: Elderly female. Awake and alert and oriented x3. No acute cardiopulmonary distress.  HEENT: Normocephalic atraumatic.  Right and left ears normal in appearance.  Pupils equal, round, reactive to light. Extraocular muscles are intact. Sclerae anicteric and noninjected.  Moist mucosal membranes. No mucosal lesions.  Neck: Neck supple without lymphadenopathy. No carotid bruits. No masses palpated.  Cardiovascular: Regular rate with normal S1-S2 sounds. No murmurs, rubs, gallops auscultated. No JVD.  Respiratory: Good respiratory effort with no wheezes, rales, rhonchi. Lungs clear to auscultation bilaterally.  No accessory muscle use. Abdomen: Soft, nontender, nondistended. Active bowel sounds. No masses or hepatosplenomegaly  Skin: Patient's left lower leg is swollen and erythematous, mostly centering on the left lower medial area where the incised abscess to is wrapped.  The cellulitis extends up the patient's leg to about the mid lower leg area. Dry, warm to touch. 2+ dorsalis pedis and radial pulses. Musculoskeletal: No calf or leg pain. All major joints not erythematous nontender.  No upper or lower  joint deformation.  Good ROM.  No contractures  Psychiatric: Intact judgment and insight. Pleasant and cooperative. Neurologic: No focal neurological deficits. Strength is 5/5 and symmetric in upper and lower extremities.  Cranial nerves II through XII are grossly intact.  Data Reviewed: Results for orders placed or performed during the hospital encounter of 12/03/22 (from the past 24 hour(s))  Basic metabolic panel     Status: Abnormal   Collection Time: 12/03/22  5:18 PM  Result Value Ref Range   Sodium 136 135 - 145 mmol/L   Potassium 3.4 (L) 3.5 - 5.1 mmol/L   Chloride 103 98 - 111 mmol/L   CO2 26 22 - 32 mmol/L   Glucose, Bld 117 (H) 70 - 99 mg/dL   BUN 12 8 - 23 mg/dL  Creatinine, Ser 0.60 0.44 - 1.00 mg/dL   Calcium 7.9 (L) 8.9 - 10.3 mg/dL   GFR, Estimated >16 >10 mL/min   Anion gap 7 5 - 15  CBC with Differential     Status: Abnormal   Collection Time: 12/03/22  5:18 PM  Result Value Ref Range   WBC 8.0 4.0 - 10.5 K/uL   RBC 2.84 (L) 3.87 - 5.11 MIL/uL   Hemoglobin 8.0 (L) 12.0 - 15.0 g/dL   HCT 96.0 (L) 45.4 - 09.8 %   MCV 95.1 80.0 - 100.0 fL   MCH 28.2 26.0 - 34.0 pg   MCHC 29.6 (L) 30.0 - 36.0 g/dL   RDW 11.9 (H) 14.7 - 82.9 %   Platelets 296 150 - 400 K/uL   nRBC 0.0 0.0 - 0.2 %   Neutrophils Relative % 56 %   Neutro Abs 4.5 1.7 - 7.7 K/uL   Lymphocytes Relative 26 %   Lymphs Abs 2.1 0.7 - 4.0 K/uL   Monocytes Relative 14 %   Monocytes Absolute 1.1 (H) 0.1 - 1.0 K/uL   Eosinophils Relative 3 %   Eosinophils Absolute 0.2 0.0 - 0.5 K/uL   Basophils Relative 1 %   Basophils Absolute 0.1 0.0 - 0.1 K/uL   Immature Granulocytes 0 %   Abs Immature Granulocytes 0.03 0.00 - 0.07 K/uL  Lactic acid, plasma     Status: None   Collection Time: 12/03/22  5:18 PM  Result Value Ref Range   Lactic Acid, Venous 0.9 0.5 - 1.9 mmol/L  D-dimer, quantitative     Status: Abnormal   Collection Time: 12/03/22  5:18 PM  Result Value Ref Range   D-Dimer, Quant 0.62 (H) 0.00 -  0.50 ug/mL-FEU  Lactic acid, plasma     Status: None   Collection Time: 12/03/22  6:51 PM  Result Value Ref Range   Lactic Acid, Venous 0.7 0.5 - 1.9 mmol/L    DG Ankle Complete Left  Result Date: 12/03/2022 CLINICAL DATA:  Wound. Facility states patient has an open area to the inside of the left ankle with concern for cellulitis. EXAM: LEFT ANKLE COMPLETE - 3+ VIEW COMPARISON:  Radiograph 10/22/2022, 08/27/2022 FINDINGS: Acute moderately displaced fracture of the posterior process of the calcaneus. The fracture fragment is displaced approximately 1.5 cm superiorly. Bone anchor is noted inferior to the fracture line. Diffuse osteopenia. Material external to the patient, possibly the patient's sock, obscures fine osseous and soft tissue detail. No subcutaneous emphysema. No radiopaque foreign body in the soft tissues. IMPRESSION: 1. Acute moderately displaced avulsion fracture of the posterior process of the calcaneus. 2. No subcutaneous emphysema to correlate with reported wound though fine soft tissue detail is obscured by material overlying the midfoot to forefoot. Electronically Signed   By: Sherron Ales M.D.   On: 12/03/2022 19:14     Assessment and Plan: No notes have been filed under this hospital service. Service: Hospitalist  Principal Problem:   Cellulitis and abscess of left lower extremity Active Problems:   Hypothyroidism   HYPERTENSION   Displaced fracture of medial malleolus of left tibia, initial encounter for closed fracture  Cellulitis and abscess of the left lower extremity Admit Rocephin and vancomycin MRSA swab Wound culture pending Repeat CBC in the morning Pain control Displaced avulsion fracture of the medial malleolus Pain control Ortho to see the patient Hypertension Continue home regimen Hypothyroidism Continue levothyroxine   Advance Care Planning:   Code Status: Prior full code  Consults: Orthopedics  Family Communication: I spoke with the  patient's granddaughter [name].  Severity of Illness: The appropriate patient status for this patient is INPATIENT. Inpatient status is judged to be reasonable and necessary in order to provide the required intensity of service to ensure the patient's safety. The patient's presenting symptoms, physical exam findings, and initial radiographic and laboratory data in the context of their chronic comorbidities is felt to place them at high risk for further clinical deterioration. Furthermore, it is not anticipated that the patient will be medically stable for discharge from the hospital within 2 midnights of admission.   * I certify that at the point of admission it is my clinical judgment that the patient will require inpatient hospital care spanning beyond 2 midnights from the point of admission due to high intensity of service, high risk for further deterioration and high frequency of surveillance required.*  Author: Levie Heritage, DO 12/03/2022 7:52 PM  For on call review [email or web address].

## 2022-12-04 DIAGNOSIS — L03116 Cellulitis of left lower limb: Secondary | ICD-10-CM | POA: Diagnosis not present

## 2022-12-04 DIAGNOSIS — L02416 Cutaneous abscess of left lower limb: Secondary | ICD-10-CM | POA: Diagnosis not present

## 2022-12-04 LAB — CBC
HCT: 28.2 % — ABNORMAL LOW (ref 36.0–46.0)
Hemoglobin: 8.1 g/dL — ABNORMAL LOW (ref 12.0–15.0)
MCH: 28 pg (ref 26.0–34.0)
MCHC: 28.7 g/dL — ABNORMAL LOW (ref 30.0–36.0)
MCV: 97.6 fL (ref 80.0–100.0)
Platelets: 293 10*3/uL (ref 150–400)
RBC: 2.89 MIL/uL — ABNORMAL LOW (ref 3.87–5.11)
RDW: 23.7 % — ABNORMAL HIGH (ref 11.5–15.5)
WBC: 7.2 10*3/uL (ref 4.0–10.5)
nRBC: 0 % (ref 0.0–0.2)

## 2022-12-04 LAB — C-REACTIVE PROTEIN: CRP: 5.4 mg/dL — ABNORMAL HIGH (ref <1.0)

## 2022-12-04 LAB — AEROBIC CULTURE W GRAM STAIN (SUPERFICIAL SPECIMEN)

## 2022-12-04 MED ORDER — TRAZODONE HCL 50 MG PO TABS: 50.0000 mg | ORAL_TABLET | Freq: Every evening | ORAL | Status: DC | PRN

## 2022-12-04 NOTE — Consult Note (Addendum)
Reason for Consult:left leg swelling  Referring Physician: Shon Hale MD   Stefanie [name] is an 82 y.o. female.  HPI: 82 yo female p/w swelling and open wound left medial ankle/lower leg  She is confused and unreliable historian  ER RECORD: Pt is a 82 yo female with pmhx significant for recent DVT f/b IVC filter, hypothyroidism, depression, gerd, arthritis, fibromyalgia, kidney stones, and anxiety. Pt has developed some redness and swelling to the LLE. She had a sore open up yesterday. The facility did an xray which showed a possible fx. She denies any trauma. She is on Flagyl on her MAR, but it is unclear why. No fevers.  Past Medical History:  Diagnosis Date  . Anxiety   . Arthritis   . Colon polyps   . Depression   . Diverticulosis of colon (without mention of hemorrhage)   . Fibromyalgia   . GERD (gastroesophageal reflux disease)   . Hiatal hernia   . History of IBS   . History of kidney stones   . Hypothyroidism   . Iron deficiency anemia due to chronic blood loss 11/15/2022    Past Surgical History:  Procedure Laterality Date  . ABDOMINAL HYSTERECTOMY    . ANKLE SURGERY     bilateral   . APPENDECTOMY  1985  . BREAST BIOPSY Left    Benign   . CHOLECYSTECTOMY  1985  . ESOPHAGEAL MANOMETRY N/A 10/22/2012   Procedure: ESOPHAGEAL MANOMETRY (EM);  Surgeon: Mardella Layman, MD;  Location: [facility];  Service: Endoscopy;  Laterality: N/A;  . ESOPHAGOGASTRODUODENOSCOPY N/A 10/01/2022   Procedure: ESOPHAGOGASTRODUODENOSCOPY (EGD);  Surgeon: [name], MD;  Location: [facility] ENDOSCOPY;  Service: Gastroenterology;  Laterality: N/A;  . EXTRACORPOREAL SHOCK WAVE LITHOTRIPSY Left 11/26/2018   Procedure: EXTRACORPOREAL SHOCK WAVE LITHOTRIPSY (ESWL);  Surgeon: Marcine Matar, MD;  Location: [facility] OR[facility];  Service: Urology;  Laterality: Left;  . HEMORRHOID SURGERY    . IR IVC FILTER PLMT / S&I /IMG GUID/MOD SED  10/24/2022  . RECTOCELE REPAIR    . TUBAL LIGATION      Family  History  Problem Relation Age of Onset  . Colon polyps Brother   . Breast cancer Neg Hx     Social History:  reports that she has never smoked. She has never used smokeless tobacco. She reports that she does not drink alcohol and does not use drugs.  Allergies:  Allergies  Allergen Reactions  . Gabapentin      Dizziness. States made her feel like she was drunk. Unsteady gait and dizziness.        . Lidocaine Rash  . Metoclopramide Rash  . Other Other (See Comments)    DYE+Nitrofurantoin+Brilliant Blue Fcf  . Sulfa Antibiotics Rash  . Z-Pak [Azithromycin] Rash  . Metoclopramide Hcl Other (See Comments)    REACTION: a drawing of all her muscles  . Penicillins Other (See Comments)    Unknown   . Procaine Hcl Other (See Comments)    Unknown   . Macrodantin [Nitrofurantoin Macrocrystal] Rash    Medications: I have reviewed the patient's current medications.  Results for orders placed or performed during the hospital encounter of 12/03/22 (from the past 48 hour(s))  Sedimentation rate     Status: Abnormal   Collection Time: 12/03/22  5:17 PM  Result Value Ref Range   Sed Rate 55 (H) 0 - 22 mm/hr    Comment: Performed at [facility], [address] [place], [place] [address]  Basic metabolic panel  Status: Abnormal   Collection Time: 12/03/22  5:18 PM  Result Value Ref Range   Sodium 136 135 - 145 mmol/L   Potassium 3.4 (L) 3.5 - 5.1 mmol/L   Chloride 103 98 - 111 mmol/L   CO2 26 22 - 32 mmol/L   Glucose, Bld 117 (H) 70 - 99 mg/dL    Comment: Glucose reference range applies only to samples taken after fasting for at least 8 hours.   BUN 12 8 - 23 mg/dL   Creatinine, Ser 1.61 0.44 - 1.00 mg/dL   Calcium 7.9 (L) 8.9 - 10.3 mg/dL   GFR, Estimated >09 >60 mL/min    Comment: (NOTE) Calculated using the CKD-EPI Creatinine Equation (2021)    Anion gap 7 5 - 15    Comment: Performed at [facility], [address] [place], [place] [address]  CBC with Differential      Status: Abnormal   Collection Time: 12/03/22  5:18 PM  Result Value Ref Range   WBC 8.0 4.0 - 10.5 K/uL   RBC 2.84 (L) 3.87 - 5.11 MIL/uL   Hemoglobin 8.0 (L) 12.0 - 15.0 g/dL   HCT 81.1 (L) 91.4 - 78.2 %   MCV 95.1 80.0 - 100.0 fL   MCH 28.2 26.0 - 34.0 pg   MCHC 29.6 (L) 30.0 - 36.0 g/dL   RDW 95.6 (H) 21.3 - 08.6 %   Platelets 296 150 - 400 K/uL   nRBC 0.0 0.0 - 0.2 %   Neutrophils Relative % 56 %   Neutro Abs 4.5 1.7 - 7.7 K/uL   Lymphocytes Relative 26 %   Lymphs Abs 2.1 0.7 - 4.0 K/uL   Monocytes Relative 14 %   Monocytes Absolute 1.1 (H) 0.1 - 1.0 K/uL   Eosinophils Relative 3 %   Eosinophils Absolute 0.2 0.0 - 0.5 K/uL   Basophils Relative 1 %   Basophils Absolute 0.1 0.0 - 0.1 K/uL   Immature Granulocytes 0 %   Abs Immature Granulocytes 0.03 0.00 - 0.07 K/uL    Comment: Performed at [facility], [address], [place], [place] [address]  Lactic acid, plasma     Status: None   Collection Time: 12/03/22  5:18 PM  Result Value Ref Range   Lactic Acid, Venous 0.9 0.5 - 1.9 mmol/L    Comment: Performed at [facility], [address] [place], [place] [address]  D-dimer, quantitative     Status: Abnormal   Collection Time: 12/03/22  5:18 PM  Result Value Ref Range   D-Dimer, Quant 0.62 (H) 0.00 - 0.50 ug/mL-FEU    Comment: (NOTE) At the manufacturer cut-off value of 0.5 g/mL FEU, this assay has a negative predictive value of 95-100%.This assay is intended for use in conjunction with a clinical pretest probability (PTP) assessment model to exclude pulmonary embolism (PE) and deep venous thrombosis (DVT) in outpatients suspected of PE or DVT. Results should be correlated with clinical presentation. Performed at [facility], [address] [place], [place] [address]   Aerobic Culture w Gram Stain (superficial specimen)     Status: None (Preliminary result)   Collection Time: 12/03/22  6:10 PM   Specimen: Wound  Result Value Ref Range   Specimen Description       WOUND Performed at [facility], [address] [place], [place] [address]    Special Requests      NONE Performed at [facility], [address] [place], [place] [address]    Gram Stain  NO WBC SEEN NO ORGANISMS SEEN Performed at [facility], [address], [place], [place] [address]    Culture PENDING    Report Status PENDING   Lactic acid, plasma     Status: None   Collection Time: 12/03/22  6:51 PM  Result Value Ref Range   Lactic Acid, Venous 0.7 0.5 - 1.9 mmol/L    Comment: Performed at [facility], [address] [place], [place] [address]  MRSA Next Gen by PCR, Nasal     Status: None   Collection Time: 12/03/22  8:06 PM   Specimen: Nasal Mucosa; Nasal Swab  Result Value Ref Range   MRSA by PCR Next Gen NOT DETECTED NOT DETECTED    Comment: (NOTE) The GeneXpert MRSA Assay (FDA approved for NASAL specimens only), is one component of a comprehensive MRSA colonization surveillance program. It is not intended to diagnose MRSA infection nor to guide or monitor treatment for MRSA infections. Test performance is not FDA approved in patients less than 56 years old. Performed at [facility], [address], [place], [place] [address]   CBC     Status: Abnormal   Collection Time: 12/04/22  4:33 AM  Result Value Ref Range   WBC 7.2 4.0 - 10.5 K/uL   RBC 2.89 (L) 3.87 - 5.11 MIL/uL   Hemoglobin 8.1 (L) 12.0 - 15.0 g/dL   HCT 21.3 (L) 08.6 - 57.8 %   MCV 97.6 80.0 - 100.0 fL   MCH 28.0 26.0 - 34.0 pg   MCHC 28.7 (L) 30.0 - 36.0 g/dL   RDW 46.9 (H) 62.9 - 52.8 %   Platelets 293 150 - 400 K/uL   nRBC 0.0 0.0 - 0.2 %    Comment: Performed at [facility], [address] [place], [place] [address]    DG Ankle Complete Left  Result Date: 12/03/2022 CLINICAL DATA:  Wound. Facility states patient has an open area to the inside of the left ankle with concern for cellulitis. EXAM: LEFT ANKLE COMPLETE - 3+ VIEW COMPARISON:  Radiograph 10/22/2022, 08/27/2022 FINDINGS:  Acute moderately displaced fracture of the posterior process of the calcaneus. The fracture fragment is displaced approximately 1.5 cm superiorly. Bone anchor is noted inferior to the fracture line. Diffuse osteopenia. Material external to the patient, possibly the patient's sock, obscures fine osseous and soft tissue detail. No subcutaneous emphysema. No radiopaque foreign body in the soft tissues. IMPRESSION: 1. Acute moderately displaced avulsion fracture of the posterior process of the calcaneus. 2. No subcutaneous emphysema to correlate with reported wound though fine soft tissue detail is obscured by material overlying the midfoot to forefoot. Electronically Signed   By: Sherron Ales M.D.   On: 12/03/2022 19:14    Review of Systems  Unable to perform ROS: Dementia   Blood pressure (!) 103/40, pulse 69, temperature 97.8 F (36.6 C), temperature source Oral, resp. rate 19, height 5\' 4"  (1.626 m), weight 67.6 kg, SpO2 98 %. Physical Exam Vitals and nursing note reviewed.  Constitutional:      General: She is not in acute distress.    Appearance: Normal appearance. She is normal weight. She is not ill-appearing, toxic-appearing or diaphoretic.  HENT:     Head: Normocephalic and atraumatic.     Nose: No congestion or rhinorrhea.     Mouth/Throat:     Mouth: Mucous membranes are moist.     Pharynx: No oropharyngeal exudate or posterior oropharyngeal erythema.  Eyes:     General: No  scleral icterus.       Right eye: No discharge.        Left eye: No discharge.     Extraocular Movements: Extraocular movements intact.     Conjunctiva/sclera: Conjunctivae normal.     Pupils: Pupils are equal, round, and reactive to light.  Cardiovascular:     Rate and Rhythm: Normal rate and regular rhythm.     Comments: DP pulse right and left foot are equal   Left ankle PT pulse I could not feel Pulmonary:     Effort: Pulmonary effort is normal.  Abdominal:     General: Abdomen is flat. There is no  distension.     Palpations: Abdomen is soft.  Musculoskeletal:     Cervical back: Neck supple. No rigidity.     Comments: See media for images   Left foot ankle leg exam:  The posterior skin is not threatened   Left leg swelling around the ankle with erythema and there is a small wound that is draining seropurulent   Pl flexion power is 4/5 on the left normal on the right   Tenderness medially around the wound and mild posteriorly  AROM left ankle is DF 5/PL FLX 10   Ankle stability is normal   Skin:    Capillary Refill: Capillary refill takes less than 2 seconds.  Neurological:     General: No focal deficit present.     Mental Status: She is alert.     Cranial Nerves: No cranial nerve deficit.     Sensory: No sensory deficit.     Coordination: Coordination normal.     Gait: Gait abnormal.   Several images were obtained over the last 3 months.  Left ankle.  2 suture anchors are noted in the left calcaneus from prior surgery that was done many years ago  The patient has had several falls over the last 3 months and images from April and May show that the suture anchors remained in place however there were hairline and comminuted fractures at the posterior process of the calcaneus which were nondisplaced  On the x-ray yesterday the fracture has displaced 1.5 cm.  However, the patient has no recent falls to explain that although she is a poor historian  The posterior calcaneus fracture appears to be subacute.  Therefore no immediate surgical intervention was done as the skin was not threatened  Cellulitis can be treated with IV antibiotics  The patient is also on Eliquis she has had an IVC filter and also had a recent DVT making surgical intervention somewhat questionable if the cellulitis does clear up.  However, if the cellulitis does not clear up and she needs drainage for an abscess then of course the posterior calcaneus fracture can be addressed at that time  There is  questionable healing in this area due to the patient's peripheral arterial disease and a vascular consult will be obtained to assess the blood flow there.  I will also consult with foot and ankle specialist as to the proper and reasonable way to proceed forward. Assessment/Plan:  Vascular consult Foot and ankle consult IV ATBX Try to obtain better history   Addendum: I was able to talk to the patient's granddaughter and she updated me that the patient was walking in March she injured her ankle was placed in a boot and within about a month  We have an x-ray in March that shows there is no fracture on the posterior calcaneus.  Then another fall happened  in April fracture seems to be minimally displaced at the posterior calcaneus.  It is unclear at this point whether or not the patient was able to walk again after that and it seems like she was basically bed to chair    [employer] 12/04/2022, 10:20 AM

## 2022-12-04 NOTE — Progress Notes (Signed)
PROGRESS NOTE   Stefanie [name], is a 82 y.o. female, DOB - [birth year], UJW:[id number]  Admit date - 12/03/2022   Admitting Physician Levie Heritage, DO  Outpatient Primary MD for the patient is [name], MD  LOS - 1  Chief Complaint  Patient presents with   Leg Injury       Brief Narrative:  82 y.o. 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 Fuller Canada appreciated, recommends follow-up in the future with foot and ankle specialist -Nonweightbearing status at this time -Continue pain control  3)Social/Ethics----plan of care and advanced directive discussed with patient and  patient's granddaughter [name] at [phone] -- Patient is a full code -She would like her granddaughter 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 Ellin Saba continue full anticoagulation for up to 3 months if no bleeding - 5)GERD--recent heme positive stool -Incomplete EGD on 09/30/2025 due to poor prep -Patient declines further  endoluminal evaluation -Continue Protonix -Watch for bleeding while on anticoagulation   6)Chronic Anemia---hemoglobin currently above 8 -Monitor closely while on anticoagulation   7)Depression Continue Lexapro 20 mg qhs -May use trazodone nightly as needed   5)Hypothyroidism Continue synthroid 75 mcg qhs   Status is: Inpatient   Disposition: The patient is from: SNF              Anticipated d/c is to: SNF              Anticipated d/c date is: 2 days              Patient currently is not medically stable to d/c. Barriers: Not Clinically Stable-   Code Status :  -  Code Status: Full Code   Family Communication:     (patient is alert, awake and coherent)  -patient's granddaughter [name] at [phone]  DVT Prophylaxis  :   - SCDs   apixaban (ELIQUIS) tablet 5 mg   Lab Results  Component Value Date   PLT 293 12/04/2022    Inpatient Medications  Scheduled Meds:  apixaban  5 mg Oral BID   escitalopram  20 mg Oral Daily   levothyroxine  75 mcg Oral Q0600   pantoprazole  40 mg Oral BID   Continuous Infusions:  cefTRIAXone (ROCEPHIN)  IV     vancomycin     PRN Meds:.ondansetron **OR** ondansetron (ZOFRAN) IV, oxyCODONE, polyethylene glycol   Anti-infectives (From admission, onward)    Start     Dose/Rate Route Frequency Ordered Stop   12/04/22 2100  vancomycin (VANCOCIN) IVPB 1000 mg/200 mL premix        1,000 mg 200  mL/hr over 60 Minutes Intravenous Every 24 hours 12/03/22 2116     12/04/22 2000  cefTRIAXone (ROCEPHIN) 1 g in sodium chloride 0.9 % 100 mL IVPB        1 g 200 mL/hr over 30 Minutes Intravenous Every 24 hours 12/03/22 2038 12/11/22 1959   12/03/22 2030  vancomycin (VANCOREADY) IVPB 1250 mg/250 mL        1,250 mg 166.7 mL/hr over 90 Minutes Intravenous  Once 12/03/22 2028 12/03/22 2230   12/03/22 1715  cefTRIAXone (ROCEPHIN) 1 g in sodium chloride 0.9 % 100 mL IVPB        1 g 200 mL/hr over 30 Minutes Intravenous  Once 12/03/22 1705  12/03/22 1759         Subjective: Stefanie [name] today has no fevers, no emesis,  No chest pain,   Resting comfortably   Objective: Vitals:   12/03/22 2039 12/03/22 2351 12/04/22 0439 12/04/22 1437  BP:  (!) 118/55 (!) 103/40 (!) 125/51  Pulse:  71 69 78  Resp:  18 19 18   Temp:  98 F (36.7 C) 97.8 F (36.6 C) 98.7 F (37.1 C)  TempSrc:  Oral Oral Oral  SpO2: 95% 95% 98% 97%  Weight:      Height:        Intake/Output Summary (Last 24 hours) at 12/04/2022 1810 Last data filed at 12/04/2022 0900 Gross per 24 hour  Intake 240 ml  Output --  Net 240 ml   Filed Weights   12/03/22 1638  Weight: 67.6 kg    Physical Exam  Gen:- Awake Alert,  in no apparent distress  HEENT:- [place].AT, No sclera icterus Neck-Supple Neck,No JVD,.  Lungs-  CTAB , fair symmetrical air movement CV- S1, S2 normal, regular  Abd-  +ve B.Sounds, Abd Soft, No tenderness,    Extremity--pedal pulses present  Psych-affect is appropriate, oriented x3 Neuro-generalized weakness, no new focal deficits, no tremors MSK-Lt leg -  Media Information  Document Information  Photos  Medial side  12/04/2022 10:08  Attached To:  Hospital Encounter on 12/03/22  Source Information  Vickki Hearing, MD  Ap-Dept [facility]    Data Reviewed: I have personally reviewed following labs and imaging studies  CBC: Recent Labs  Lab 11/28/22 1113 12/03/22 1718 12/04/22 0433  WBC 8.7 8.0 7.2  NEUTROABS  --  4.5  --   HGB 8.8* 8.0* 8.1*  HCT 29.7* 27.0* 28.2*  MCV 92.0 95.1 97.6  PLT 344 296 293   Basic Metabolic Panel: Recent Labs  Lab 12/03/22 1718  NA 136  K 3.4*  CL 103  CO2 26  GLUCOSE 117*  BUN 12  CREATININE 0.60  CALCIUM 7.9*   GFR: Estimated Creatinine Clearance: 51.3 mL/min (by C-G formula based on SCr of 0.6 mg/dL). Liver Function Tests: No results for input(s): "AST", "ALT", "ALKPHOS", "BILITOT", "PROT", "ALBUMIN" in the last 168 hours. Cardiac Enzymes: No results for input(s):  "CKTOTAL", "CKMB", "CKMBINDEX", "TROPONINI" in the last 168 hours. BNP (last 3 results) No results for input(s): "PROBNP" in the last 8760 hours. HbA1C: No results for input(s): "HGBA1C" in the last 72 hours. Sepsis Labs: @LABRCNTIP (procalcitonin:4,lacticidven:4) ) Recent Results (from the past 240 hour(s))  Aerobic Culture w Gram Stain (superficial specimen)     Status: None (Preliminary result)   Collection Time: 12/03/22  6:10 PM   Specimen: Wound  Result Value Ref Range Status   Specimen Description   Final    WOUND Performed at [facility], [address], [place], [place] [address]    Special Requests   Final    NONE Performed at [facility], [address], [place], [place] [address]    Gram Stain NO WBC SEEN NO ORGANISMS SEEN   Final   Culture   Final    FEW STAPHYLOCOCCUS AUREUS SUSCEPTIBILITIES TO FOLLOW Performed at [facility], [address], [place], [place] [address]    Report Status PENDING  Incomplete  MRSA Next Gen by PCR, Nasal     Status: None   Collection Time: 12/03/22  8:06 PM   Specimen: Nasal Mucosa; Nasal Swab  Result Value Ref Range Status   MRSA by PCR Next Gen NOT DETECTED NOT DETECTED Final    Comment: (NOTE) The GeneXpert MRSA Assay (FDA approved for NASAL specimens only), is one component of a comprehensive MRSA colonization surveillance program. It is not intended to diagnose MRSA infection nor to guide or monitor treatment for MRSA infections. Test performance is not FDA approved in patients less than 2 years old. Performed at [facility], [address], [place], [place] [address]       Radiology Studies: DG Ankle Complete Left  Result Date: 12/03/2022 CLINICAL DATA:  Wound. Facility states patient has an open area to the inside of the left ankle with concern for cellulitis. EXAM: LEFT ANKLE COMPLETE - 3+ VIEW COMPARISON:  Radiograph 10/22/2022, 08/27/2022 FINDINGS: Acute moderately displaced fracture of the posterior process  of the calcaneus. The fracture fragment is displaced approximately 1.5 cm superiorly. Bone anchor is noted inferior to the fracture line. Diffuse osteopenia. Material external to the patient, possibly the patient's sock, obscures fine osseous and soft tissue detail. No subcutaneous emphysema. No radiopaque foreign body in the soft tissues. IMPRESSION: 1. Acute moderately displaced avulsion fracture of the posterior process of the calcaneus. 2. No subcutaneous emphysema to correlate with reported wound though fine soft tissue detail is obscured by material overlying the midfoot to forefoot. Electronically Signed   By: Sherron Ales M.D.   On: 12/03/2022 19:14     Scheduled Meds:  apixaban  5 mg Oral BID   escitalopram  20 mg Oral Daily   levothyroxine  75 mcg Oral Q0600   pantoprazole  40 mg Oral BID   Continuous Infusions:  cefTRIAXone (ROCEPHIN)  IV     vancomycin       LOS: 1 day    Shon Hale M.D on 12/04/2022 at 6:10 PM  Go to [email or web address] - for contact info  [facility] - Office  [phone]  If 7PM-7AM, please contact night-coverage [email or web address] 12/04/2022, 6:10 PM

## 2022-12-05 ENCOUNTER — Inpatient Hospital Stay: Payer: No Typology Code available for payment source

## 2022-12-05 DIAGNOSIS — L03116 Cellulitis of left lower limb: Secondary | ICD-10-CM | POA: Diagnosis not present

## 2022-12-05 DIAGNOSIS — L02416 Cutaneous abscess of left lower limb: Secondary | ICD-10-CM | POA: Diagnosis not present

## 2022-12-05 LAB — AEROBIC CULTURE W GRAM STAIN (SUPERFICIAL SPECIMEN)

## 2022-12-05 MED ORDER — CEFAZOLIN SODIUM-DEXTROSE 2-4 GM/100ML-% IV SOLN
2.0000 g | Freq: Three times a day (TID) | INTRAVENOUS | Status: DC
  Administered 2022-12-05 – 2022-12-07 (×5): 2 g via INTRAVENOUS
  Filled 2022-12-05 (×5): qty 100

## 2022-12-05 MED ORDER — DOXYCYCLINE HYCLATE 100 MG PO TABS
100.0000 mg | ORAL_TABLET | Freq: Two times a day (BID) | ORAL | Status: DC
  Administered 2022-12-05 – 2022-12-07 (×4): 100 mg via ORAL
  Filled 2022-12-05 (×4): qty 1

## 2022-12-05 NOTE — Evaluation (Signed)
Physical Therapy Evaluation Patient Details Name: Stefanie [name] MRN: [id number] DOB: 1940-12-13 Today's Date: 12/05/2022  History of Present Illness  Analeah Meinhardt is a 82 y.o. female with medical history significant of hypothyroidism, fibromyalgia, GERD, diverticulosis, IBS, history of DVT, anemia of chronic disease.  Patient presents with redness and swelling to the left lower extremity.  This was first noticed yesterday with a sore that started to open up.  The facility where she lives did a x-ray which showed a possible fracture, so she was sent here for evaluation.  She denies fevers, chills, nausea, vomiting.     Per the patient's granddaughter, the patient has been having trouble with her left ankle for several months.  She first injured her ankle as a sprain.  She has had several x-rays, none of which have seen the fracture.   Clinical Impression  Patient demonstrates labored movement for sitting up at bedside, poor carryover for non-weightbearing on LLE when taking steps at bedside and limited mostly due to c/o fatigue.  Patient tolerated sitting up in chair after therapy. Patient will benefit from continued skilled physical therapy in hospital and recommended venue below to increase strength, balance, endurance for safe ADLs and gait.         Recommendations for follow up therapy are one component of a multi-disciplinary discharge planning process, led by the attending physician.  Recommendations may be updated based on patient status, additional functional criteria and insurance authorization.  Follow Up Recommendations Can patient physically be transported by private vehicle: Yes     Assistance Recommended at Discharge Set up Supervision/Assistance  Patient can return home with the following  A lot of help with walking and/or transfers;Help with stairs or ramp for entrance;Assistance with cooking/housework;A lot of help with bathing/dressing/bathroom    Equipment Recommendations None  recommended by PT  Recommendations for Other Services       Functional Status Assessment Patient has had a recent decline in their functional status and demonstrates the ability to make significant improvements in function in a reasonable and predictable amount of time.     Precautions / Restrictions Precautions Precautions: Fall Restrictions Weight Bearing Restrictions: Yes LLE Weight Bearing: Non weight bearing      Mobility  Bed Mobility Overal bed mobility: Needs Assistance Bed Mobility: Supine to Sit     Supine to sit: Min assist, Mod assist     General bed mobility comments: increased time, labored movement    Transfers Overall transfer level: Needs assistance Equipment used: Rolling walker (2 wheels) Transfers: Sit to/from Stand, Bed to chair/wheelchair/BSC Sit to Stand: Mod assist, Min assist   Step pivot transfers: Min assist, Mod assist       General transfer comment: inceased time,  labored movement with difficulty maintaining NWB on LLE    Ambulation/Gait Ambulation/Gait assistance: Mod assist Gait Distance (Feet): 4 Feet Assistive device: Rolling walker (2 wheels) Gait Pattern/deviations: Decreased step length - right, Decreased step length - left, Decreased stride length, Antalgic, Decreased stance time - left Gait velocity: slow     General Gait Details: limited to a few steps at bedside before having to sit due to fatigue and poor carryover for maintaining NWB on left foot  Stairs            Wheelchair Mobility    Modified Rankin (Stroke Patients Only)       Balance Overall balance assessment: Needs assistance Sitting-balance support: Feet supported, No upper extremity supported Sitting balance-Leahy Scale: Good Sitting balance - Comments:  seated at EOB   Standing balance support: Reliant on assistive device for balance, During functional activity, Bilateral upper extremity supported Standing balance-Leahy Scale: Fair Standing  balance comment: using RW                             Pertinent Vitals/Pain Pain Assessment Pain Assessment: Faces Faces Pain Scale: Hurts a little bit Pain Location: left ankle/foot Pain Descriptors / Indicators: Sore, Discomfort Pain Intervention(s): Limited activity within patient's tolerance, Monitored during session, Repositioned    Home Living Family/patient expects to be discharged to:: Private residence Living Arrangements: Alone Available Help at Discharge: Family;Friend(s);Personal care attendant Type of Home: House Home Access: Level entry       Home Layout: One level Home Equipment: Rollator (4 wheels);Cane - single point;BSC/3in1;Toilet riser;Electric scooter Additional Comments: Has aides 5x/week up to 4-5 hrs per day.    Prior Function Prior Level of Function : Needs assist             Mobility Comments: household ambulator using Rollator using CAM boot LLE ADLs Comments: Has aides 5x/week up to 4-5 hrs per day, assisted by family     Hand Dominance   Dominant Hand: Right    Extremity/Trunk Assessment   Upper Extremity Assessment Upper Extremity Assessment: Generalized weakness    Lower Extremity Assessment Lower Extremity Assessment: Generalized weakness;LLE deficits/detail LLE Deficits / Details: grossly 3+/5, except ankle/foot -3/5 LLE Sensation: WNL LLE Coordination: WNL    Cervical / Trunk Assessment Cervical / Trunk Assessment: Normal  Communication   Communication: No difficulties  Cognition Arousal/Alertness: Awake/alert Behavior During Therapy: WFL for tasks assessed/performed Overall Cognitive Status: Within Functional Limits for tasks assessed                                          General Comments      Exercises     Assessment/Plan    PT Assessment Patient needs continued PT services  PT Problem List Decreased strength;Decreased activity tolerance;Decreased balance;Decreased mobility        PT Treatment Interventions DME instruction;Gait training;Functional mobility training;Therapeutic activities;Therapeutic exercise;Patient/family education;Balance training    PT Goals (Current goals can be found in the Care Plan section)  Acute Rehab PT Goals Patient Stated Goal: return home PT Goal Formulation: With patient Time For Goal Achievement: 12/19/22 Potential to Achieve Goals: Good    Frequency Min 3X/week     Co-evaluation               AM-PAC PT "6 Clicks" Mobility  Outcome Measure Help needed turning from your back to your side while in a flat bed without using bedrails?: A Little Help needed moving from lying on your back to sitting on the side of a flat bed without using bedrails?: A Lot Help needed moving to and from a bed to a chair (including a wheelchair)?: A Lot Help needed standing up from a chair using your arms (e.g., wheelchair or bedside chair)?: A Lot Help needed to walk in hospital room?: A Lot Help needed climbing 3-5 steps with a railing? : Total 6 Click Score: 12    End of Session   Activity Tolerance: Patient tolerated treatment well;Patient limited by fatigue Patient left: in chair;with call bell/phone within reach Nurse Communication: Mobility status PT Visit Diagnosis: Unsteadiness on feet (R26.81);Other abnormalities of gait and mobility (  R26.89);Muscle weakness (generalized) (M62.81)    Time: 1610-9604 PT Time Calculation (min) (ACUTE ONLY): 30 min   Charges:   PT Evaluation $PT Eval Moderate Complexity: 1 Mod PT Treatments $Therapeutic Activity: 23-37 mins        12:24 PM, 12/05/22 Ocie Bob, MPT Physical Therapist with [facility] 336 [phone] office [phone] mobile phone

## 2022-12-05 NOTE — Plan of Care (Signed)
  Problem: Acute Rehab PT Goals(only PT should resolve) Goal: Pt Will Go Supine/Side To Sit Outcome: Progressing Flowsheets (Taken 12/05/2022 1228) Pt will go Supine/Side to Sit:  with min guard assist  with minimal assist Goal: Patient Will Transfer Sit To/From Stand Outcome: Progressing Flowsheets (Taken 12/05/2022 1228) Patient will transfer sit to/from stand: with minimal assist Goal: Pt Will Transfer Bed To Chair/Chair To Bed Outcome: Progressing Flowsheets (Taken 12/05/2022 1228) Pt will Transfer Bed to Chair/Chair to Bed: with min assist Goal: Pt Will Ambulate Outcome: Progressing Flowsheets (Taken 12/05/2022 1228) Pt will Ambulate:  10 feet  with minimal assist  with moderate assist  with rolling walker   12:28 PM, 12/05/22 Ocie Bob, MPT Physical Therapist with [facility] 336 [phone] office [phone] mobile phone

## 2022-12-05 NOTE — Progress Notes (Signed)
Subjective: Cellulitis, medial wound, posterior calcaneus fracture   Objective: Vital signs in last 24 hours: Temp:  [98.7 F (37.1 C)] 98.7 F (37.1 C) (07/01 0353) Pulse Rate:  [73-78] 73 (07/01 0353) Resp:  [16-18] 16 (07/01 0353) BP: (115-130)/(44-54) 130/54 (07/01 0353) SpO2:  [95 %-97 %] 96 % (07/01 0353)  Intake/Output from previous day: 06/30 0701 - 07/01 0700 In: 720 [P.O.:720] Out: 500 [Urine:500] Intake/Output this shift: Total I/O In: 240 [P.O.:240] Out: -   Recent Labs    12/03/22 1718 12/04/22 0433  HGB 8.0* 8.1*   Recent Labs    12/03/22 1718 12/04/22 0433  WBC 8.0 7.2  RBC 2.84* 2.89*  HCT 27.0* 28.2*  PLT 296 293   Recent Labs    12/03/22 1718  NA 136  K 3.4*  CL 103  CO2 26  BUN 12  CREATININE 0.60  GLUCOSE 117*  CALCIUM 7.9*   No results for input(s): "LABPT", "INR" in the last 72 hours.   Assessment/Plan: I spoke to vascular Dr. Sherral Hammers and he feels that over the patient the pulses are palpable; probably okay to go ahead with the left surgical intervention is needed however no clear-cut guarantee that the ongoing melanoma     Fuller Canada 12/05/2022, 11:28 AM

## 2022-12-05 NOTE — Progress Notes (Signed)
PROGRESS NOTE   Stefanie [name], is a 82 y.o. female, DOB - 12-28-1940, ZOX:[id number]  Admit date - 12/03/2022   Admitting Physician Levie Heritage, DO  Outpatient Primary MD for the patient is [name], MD  LOS - 2  Chief Complaint  Patient presents with   Leg Injury       Brief Narrative:  82 y.o. 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 doxycycline from IV Rocephin and Vanco   2)Displaced avulsion fracture of the medial malleolus-- Orthopedic consult from Dr. Fuller Canada appreciated, recommends follow-up in the future with foot and ankle specialist -Nonweightbearing status at this time -Orthopedic surgeon discussed case with vascular surgeon Dr. Sherral Hammers -Continue pain control  3)Social/Ethics----plan of care and advanced directive discussed with patient and  patient's granddaughter [name] at [phone] -- Patient is a full code -She would like her granddaughter 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 Ellin Saba continue full anticoagulation for up to  3 months if no bleeding - 5)GERD--recent heme positive stool -Incomplete EGD on 10/01/22 due to poor prep -Patient declines further endoluminal evaluation -Continue Protonix -Watch for bleeding while on anticoagulation   6)Chronic Anemia---hemoglobin currently above 8 -Monitor closely while on anticoagulation   7)Depression Continue Lexapro 20 mg qhs -May use trazodone nightly as needed   5)Hypothyroidism Continue synthroid 75 mcg at bedtime  6) generalized weakness and ambulatory dysfunction--- PT eval appreciated recommends SNF rehab   Status is: Inpatient   Disposition: The patient is from: ALF --[facility]              Anticipated d/c is to: SNF              Anticipated d/c date is: 2 days              Patient currently is not medically stable to d/c. Barriers: Not Clinically Stable-   Code Status :  -  Code Status: Full Code   Family Communication:     (patient is alert, awake and coherent)  -patient's granddaughter [name] at [phone]  DVT Prophylaxis  :   - SCDs   apixaban (ELIQUIS) tablet 5 mg   Lab Results  Component Value Date   PLT 293 12/04/2022   Inpatient Medications  Scheduled Meds:  apixaban  5 mg Oral BID   doxycycline  100 mg Oral Q12H   escitalopram  20 mg Oral Daily   levothyroxine  75 mcg Oral Q0600   pantoprazole  40 mg Oral BID   Continuous Infusions:   ceFAZolin (ANCEF) IV     PRN Meds:.ondansetron **OR** ondansetron (ZOFRAN) IV, oxyCODONE, polyethylene glycol, traZODone  Anti-infectives (From admission, onward)    Start     Dose/Rate Route Frequency Ordered Stop   12/05/22 2200  doxycycline (VIBRA-TABS) tablet 100 mg        100 mg Oral Every 12 hours 12/05/22 1412     12/05/22 2000  ceFAZolin (ANCEF) IVPB 2g/100 mL premix        2 g 200 mL/hr over 30 Minutes Intravenous Every 8 hours 12/05/22 1412     12/04/22 2100  vancomycin (VANCOCIN) IVPB 1000 mg/200 mL premix  Status:  Discontinued        1,000 mg 200 mL/hr  over 60 Minutes Intravenous Every 24 hours 12/03/22 2116 12/05/22 1411   12/04/22 2000  cefTRIAXone (ROCEPHIN) 1 g in sodium chloride 0.9 % 100 mL IVPB  Status:  Discontinued        1 g 200 mL/hr over 30 Minutes Intravenous Every 24 hours 12/03/22 2038 12/05/22 1411   12/03/22 2030  vancomycin (VANCOREADY) IVPB 1250 mg/250 mL        1,250 mg 166.7 mL/hr over 90 Minutes Intravenous  Once 12/03/22 2028 12/03/22 2230   12/03/22 1715  cefTRIAXone (ROCEPHIN) 1 g in sodium chloride 0.9 % 100 mL IVPB        1 g 200 mL/hr over 30 Minutes Intravenous  Once 12/03/22 1705 12/03/22 1759       Subjective: Stefanie [name] today has no fevers, no emesis,  No chest pain,    -no New complaints -Oral intake is fair  Objective: Vitals:   12/04/22 1437 12/04/22 2014 12/05/22 0353 12/05/22 1411  BP: (!) 125/51 (!) 115/44 (!) 130/54 (!) 109/51  Pulse: 78 75 73 74  Resp: 18 16 16    Temp: 98.7 F (37.1 C) 98.7 F (37.1 C) 98.7 F (37.1 C) 98 F (36.7 C)  TempSrc: Oral Oral Oral Oral  SpO2: 97% 95% 96% 100%  Weight:      Height:        Intake/Output Summary (Last 24 hours) at 12/05/2022 1837 Last data filed at 12/05/2022 1832 Gross per 24 hour  Intake 960 ml  Output 1650 ml  Net -690 ml   Filed Weights   12/03/22 1638  Weight: 67.6 kg   Physical Exam  Gen:- Awake Alert,  in no apparent distress  HEENT:- [place].AT, No sclera icterus Neck-Supple Neck,No JVD,.  Lungs-  CTAB , fair symmetrical air movement CV- S1, S2 normal, regular  Abd-  +ve B.Sounds, Abd Soft, No tenderness,    Extremity--pedal pulses present  Psych-affect is appropriate, oriented x3 Neuro-generalized weakness, no new focal deficits, no tremors MSK-Lt leg  Tibia side wound with much less erythema, much less swelling and much less tenderness -  Data Reviewed: I have personally reviewed following labs and imaging studies  CBC: Recent Labs  Lab 12/03/22 1718 12/04/22 0433  WBC 8.0 7.2  NEUTROABS 4.5  --   HGB 8.0* 8.1*   HCT 27.0* 28.2*  MCV 95.1 97.6  PLT 296 293   Basic Metabolic Panel: Recent Labs  Lab 12/03/22 1718  NA 136  K 3.4*  CL 103  CO2 26  GLUCOSE 117*  BUN 12  CREATININE 0.60  CALCIUM 7.9*   GFR: Estimated Creatinine Clearance: 51.3 mL/min (by C-G formula based on SCr of 0.6 mg/dL).  Recent Results (from the past 240 hour(s))  Aerobic Culture w Gram Stain (superficial specimen)     Status: None   Collection Time: 12/03/22  6:10 PM   Specimen: Wound  Result  Value Ref Range Status   Specimen Description   Final    WOUND Performed at [facility], [address] [place], [place] [address]    Special Requests   Final    NONE Performed at [facility], [address] [place], [place] [address]    Gram Stain   Final    NO WBC SEEN NO ORGANISMS SEEN Performed at [facility], [address] [place], [place] [address]    Culture FEW STAPHYLOCOCCUS AUREUS  Final   Report Status 12/05/2022 FINAL  Final   Organism ID, Bacteria STAPHYLOCOCCUS AUREUS  Final      Susceptibility   Staphylococcus aureus - MIC*    CIPROFLOXACIN >=8 RESISTANT Resistant     ERYTHROMYCIN >=8 RESISTANT Resistant     GENTAMICIN <=0.5 SENSITIVE Sensitive     OXACILLIN 0.5 SENSITIVE Sensitive     TETRACYCLINE <=1 SENSITIVE Sensitive     VANCOMYCIN <=0.5 SENSITIVE Sensitive     TRIMETH/SULFA >=320 RESISTANT Resistant     CLINDAMYCIN <=0.25 SENSITIVE Sensitive     RIFAMPIN <=0.5 SENSITIVE Sensitive     Inducible Clindamycin NEGATIVE Sensitive     LINEZOLID 2 SENSITIVE Sensitive     * FEW STAPHYLOCOCCUS AUREUS  MRSA Next Gen by PCR, Nasal     Status: None   Collection Time: 12/03/22  8:06 PM   Specimen: Nasal Mucosa; Nasal Swab  Result Value Ref Range Status   MRSA by PCR Next Gen NOT DETECTED NOT DETECTED Final    Comment: (NOTE) The GeneXpert MRSA Assay (FDA approved for NASAL specimens only), is one component of a comprehensive MRSA colonization surveillance program. It is not intended to  diagnose MRSA infection nor to guide or monitor treatment for MRSA infections. Test performance is not FDA approved in patients less than 59 years old. Performed at [facility], [address], [place], [place] [address]     Radiology Studies: No results found.  Scheduled Meds:  apixaban  5 mg Oral BID   doxycycline  100 mg Oral Q12H   escitalopram  20 mg Oral Daily   levothyroxine  75 mcg Oral Q0600   pantoprazole  40 mg Oral BID   Continuous Infusions:   ceFAZolin (ANCEF) IV      LOS: 2 days   Shon Hale M.D on 12/05/2022 at 6:37 PM  Go to [email or web address] - for contact info  [facility] - Office  [phone]  If 7PM-7AM, please contact night-coverage [email or web address] 12/05/2022, 6:37 PM

## 2022-12-05 NOTE — TOC Initial Note (Signed)
Transition of Care [facility]) - Initial/Assessment Note    Patient Details  Name: Stefanie [name] MRN: [id number] Date of Birth: March 12, 1941  [facility] CM/SW Contact:    Elliot Gault, LCSW Phone Number: 12/05/2022, 1:46 PM  Clinical Narrative:                  Pt admitted from [facility]. Spoke with staff at the ALF who state pt has only been there for one day. She reportedly dc'd to them from [facility]. Updated staff on PT evaluation and they agree that pt needs to return to SNF rehab.  Spoke with pt/gdtr who are in agreement with SNF referrals. Preference is for return to [facility].  Will send out for SNF and start ins auth. MD anticipating dc tomorrow.  Expected Discharge Plan: Skilled Nursing Facility Barriers to Discharge: Continued Medical Work up   Patient Goals and CMS Choice Patient states their goals for this hospitalization and ongoing recovery are:: get better CMS Medicare.gov Compare Post Acute Care list provided to:: Patient Represenative (must comment) Choice offered to / list presented to : Adult Children [place] ownership interest in [facility].provided to:: Patient    Expected Discharge Plan and Services In-house Referral: Clinical Social Work   Post Acute Care Choice: Skilled Nursing Facility Living arrangements for the past 2 months: Skilled Nursing Facility, Assisted Living Facility                                      Prior Living Arrangements/Services Living arrangements for the past 2 months: Skilled Holiday representative, Assisted Living Facility Lives with:: Facility Resident Patient language and need for interpreter reviewed:: Yes Do you feel safe going back to the place where you live?: Yes      Need for Family Participation in Patient Care: No (Comment) Care giver support system in place?: Yes (comment)   Criminal Activity/Legal Involvement Pertinent to Current Situation/Hospitalization: No - Comment as needed  Activities of  Daily Living      Permission Sought/Granted Permission sought to share information with : Facility Industrial/product designer granted to share information with : Yes, Verbal Permission Granted     Permission granted to share info w AGENCY: snfs        Emotional Assessment       Orientation: : Oriented to Self, Oriented to Place, Oriented to  Time, Oriented to Situation Alcohol / Substance Use: Not Applicable Psych Involvement: No (comment)  Admission diagnosis:  Abscess [L02.91] Cellulitis of left lower extremity [L03.116] Closed displaced fracture of body of left calcaneus, initial encounter [S92.012A] Cellulitis and abscess of left lower extremity [L03.116, L02.416] Patient Active Problem List   Diagnosis Date Noted   Cellulitis and abscess of left lower extremity 12/03/2022   Displaced fracture of medial malleolus of left tibia, initial encounter for closed fracture 12/03/2022   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/2022   Iron deficiency anemia due to chronic blood loss 11/15/2022   Cerebral atrophy (HCC) 11/01/2022   Hypokalemia 11/01/2022   Vitamin D deficiency 11/01/2022   Chronic anemia 11/01/2022   Acute on chronic blood loss anemia 10/28/2022   Neurocognitive deficits 10/28/2022   Falls 10/22/2022   Acute deep vein thrombosis (DVT) of left lower extremity (HCC) 10/22/2022   Heme positive stool 09/30/2022   Chronic deep vein thrombosis (DVT) (HCC) 09/29/2022   HELICOBACTER PYLORI GASTRITIS 06/27/2007   Hypothyroidism  06/27/2007   B12 DEFICIENCY 06/27/2007   ANXIETY DISORDER 06/27/2007   Depression 06/27/2007   HYPERTENSION 06/27/2007   GERD 06/27/2007   GASTROPARESIS 06/27/2007   PERIUMBILICAL HERNIA 06/27/2007   HIATAL HERNIA 06/27/2007   Chronic constipation 06/27/2007   IBS 06/27/2007   DEGENERATIVE JOINT DISEASE 06/27/2007   ABDOMINAL PAIN, CHRONIC 06/27/2007   ESOPHAGEAL STRICTURE 01/02/2002   PCP:  [name],  MD Pharmacy:   [facility] [place], [place] - [address] [place] [address] Phone: [phone] Fax: [phone]     Social Determinants of Health (SDOH) Social History: SDOH Screenings   Food Insecurity: Food Insecurity Present (10/22/2022)  Housing: Low Risk  (10/22/2022)  Transportation Needs: No Transportation Needs (10/22/2022)  Utilities: Not At Risk (10/22/2022)  Tobacco Use: Low Risk  (12/03/2022)   SDOH Interventions:     Readmission Risk Interventions    12/05/2022    1:45 PM 10/25/2022   11:16 AM  Readmission Risk Prevention Plan  Transportation Screening Complete Complete  Home Care Screening  Complete  Medication Review (RN CM)  Complete  HRI or Home Care Consult Complete   Social Work Consult for Recovery Care Planning/Counseling Complete   Palliative Care Screening Not Applicable   Medication Review Oceanographer) Complete

## 2022-12-05 NOTE — [place] FL2 (Signed)
[place] MEDICAID FL2 LEVEL OF CARE FORM     IDENTIFICATION  Patient Name: Stefanie [name] Birthdate: [birth year] Sex: female Admission Date (Current Location): 12/03/2022  [facility] and [place] Number:  [employer] and Address:  [facility],  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/2022   Iron deficiency anemia due to chronic blood loss 11/15/2022   Cerebral atrophy (HCC) 11/01/2022   Hypokalemia 11/01/2022   Vitamin D deficiency 11/01/2022   Chronic anemia 11/01/2022   Acute on chronic blood loss anemia 10/28/2022   Neurocognitive deficits 10/28/2022   Falls 10/22/2022   Acute deep vein thrombosis (DVT) of left lower extremity (HCC) 10/22/2022   Heme positive stool 09/30/2022   Chronic deep vein thrombosis (DVT) (HCC) 09/29/2022   HELICOBACTER PYLORI GASTRITIS 06/27/2007   Hypothyroidism 06/27/2007   B12 DEFICIENCY 06/27/2007   ANXIETY DISORDER 06/27/2007   Depression 06/27/2007   HYPERTENSION 06/27/2007   GERD 06/27/2007   GASTROPARESIS 06/27/2007   PERIUMBILICAL HERNIA 06/27/2007   HIATAL HERNIA 06/27/2007   Chronic constipation 06/27/2007   IBS 06/27/2007   DEGENERATIVE JOINT DISEASE 06/27/2007   ABDOMINAL PAIN, CHRONIC 06/27/2007   ESOPHAGEAL STRICTURE 01/02/2002    Orientation RESPIRATION BLADDER Height & Weight     Self, Time,  Situation, Place  Normal Continent Weight: 149 lb (67.6 kg) Height:  5\' 4"  (162.6 cm)  BEHAVIORAL SYMPTOMS/MOOD NEUROLOGICAL BOWEL NUTRITION STATUS      Continent Diet (see dc summary)  AMBULATORY STATUS COMMUNICATION OF NEEDS Skin   Extensive Assist   Normal                       Personal Care Assistance Level of Assistance  Bathing, Feeding, Dressing Bathing Assistance: Limited assistance Feeding assistance: Independent Dressing Assistance: Limited assistance     Functional Limitations Info  Sight, Hearing, Speech Sight Info: Adequate Hearing Info: Adequate Speech Info: Adequate    SPECIAL CARE FACTORS FREQUENCY  PT (By licensed PT), OT (By licensed OT)     PT Frequency: 5x week OT Frequency: 5x week            Contractures Contractures Info: Not present    Additional Factors Info  Code Status, Allergies Code Status Info: Full Allergies Info: Gabapentin, Lidocaine, Metoclopramide, Other, Sulfa Antibiotics, Z-pak (Azithromycin), Metoclopramide Hcl, Penicillins, Procaine Hcl, Macrodantin (Nitrofurantoin Macrocrystal)           Current Medications (12/05/2022):  This is the current hospital active medication list Current Facility-Administered Medications  Medication Dose Route Frequency Provider Last Rate Last Admin   apixaban (ELIQUIS) tablet 5 mg  5 mg Oral BID Levie Heritage, DO   5 mg at 12/05/22 0841   cefTRIAXone (ROCEPHIN) 1 g in sodium chloride 0.9 % 100 mL IVPB  1 g Intravenous Q24H Levie Heritage, DO 200 mL/hr at 12/04/22 1941 1 g at 12/04/22  1941   escitalopram (LEXAPRO) tablet 20 mg  20 mg Oral Daily Levie Heritage, DO   20 mg at 12/05/22 0841   levothyroxine (SYNTHROID) tablet 75 mcg  75 mcg Oral Q0600 Levie Heritage, DO   75 mcg at 12/05/22 0600   ondansetron (ZOFRAN) tablet 4 mg  4 mg Oral Q6H PRN Levie Heritage, DO       Or   ondansetron [facility]) injection 4 mg  4 mg Intravenous Q6H PRN Levie Heritage, DO       oxyCODONE (Oxy  IR/ROXICODONE) immediate release tablet 5 mg  5 mg Oral Q4H PRN Levie Heritage, DO   5 mg at 12/04/22 1933   pantoprazole (PROTONIX) EC tablet 40 mg  40 mg Oral BID Levie Heritage, DO   40 mg at 12/05/22 0841   polyethylene glycol (MIRALAX / GLYCOLAX) packet 17 g  17 g Oral Daily PRN Levie Heritage, DO       traZODone (DESYREL) tablet 50 mg  50 mg Oral QHS PRN Shon Hale, MD       vancomycin (VANCOCIN) IVPB 1000 mg/200 mL premix  1,000 mg Intravenous Q24H Bell, Lorin C, RPH 200 mL/hr at 12/04/22 2113 1,000 mg at 12/04/22 2113     Discharge Medications: Please see discharge summary for a list of discharge medications.  Relevant Imaging Results:  Relevant Lab Results:   Additional Information SSN: [id number]  Elliot Gault, LCSW

## 2022-12-06 LAB — BASIC METABOLIC PANEL
Anion gap: 8 (ref 5–15)
BUN: 10 mg/dL (ref 8–23)
CO2: 28 mmol/L (ref 22–32)
Calcium: 8.2 mg/dL — ABNORMAL LOW (ref 8.9–10.3)
Chloride: 102 mmol/L (ref 98–111)
Creatinine, Ser: 0.66 mg/dL (ref 0.44–1.00)
GFR, Estimated: >60 mL/min (ref >60)
Glucose, Bld: 109 mg/dL — ABNORMAL HIGH (ref 70–99)
Potassium: 3.8 mmol/L (ref 3.5–5.1)
Sodium: 138 mmol/L (ref 135–145)

## 2022-12-06 LAB — CBC
HCT: 30.7 % — ABNORMAL LOW (ref 36.0–46.0)
Hemoglobin: 9.1 g/dL — ABNORMAL LOW (ref 12.0–15.0)
MCH: 28.3 pg (ref 26.0–34.0)
MCHC: 29.6 g/dL — ABNORMAL LOW (ref 30.0–36.0)
MCV: 95.6 fL (ref 80.0–100.0)
Platelets: 346 10*3/uL (ref 150–400)
RBC: 3.21 MIL/uL — ABNORMAL LOW (ref 3.87–5.11)
RDW: 23.6 % — ABNORMAL HIGH (ref 11.5–15.5)
WBC: 8.2 10*3/uL (ref 4.0–10.5)
nRBC: 0 % (ref 0.0–0.2)

## 2022-12-06 MED ORDER — ACETAMINOPHEN 325 MG PO TABS: 650.0000 mg | ORAL_TABLET | Freq: Four times a day (QID) | ORAL | 2 refills | Status: AC | PRN

## 2022-12-06 MED ORDER — TRAZODONE HCL 50 MG PO TABS: 50.0000 mg | ORAL_TABLET | Freq: Every evening | ORAL | 1 refills | Status: AC | PRN

## 2022-12-06 MED ORDER — POLYETHYLENE GLYCOL 3350 17 G PO PACK: 17.0000 g | PACK | Freq: Every day | ORAL | 0 refills | Status: AC | PRN

## 2022-12-06 MED ORDER — ACETAMINOPHEN 325 MG PO TABS
650.0000 mg | ORAL_TABLET | Freq: Four times a day (QID) | ORAL | Status: DC | PRN
  Administered 2022-12-06: 650 mg via ORAL
  Filled 2022-12-06: qty 2

## 2022-12-06 MED ORDER — DOXYCYCLINE HYCLATE 100 MG PO TABS: 100.0000 mg | ORAL_TABLET | Freq: Two times a day (BID) | ORAL | 0 refills | Status: AC

## 2022-12-06 MED ORDER — CEPHALEXIN 500 MG PO CAPS: 500.0000 mg | ORAL_CAPSULE | Freq: Three times a day (TID) | ORAL | 0 refills | Status: AC

## 2022-12-06 MED ORDER — APIXABAN 5 MG PO TABS: 5.0000 mg | ORAL_TABLET | Freq: Two times a day (BID) | ORAL | 0 refills | Status: DC

## 2022-12-06 MED ORDER — PANTOPRAZOLE SODIUM 40 MG PO TBEC: 40.0000 mg | DELAYED_RELEASE_TABLET | Freq: Every day | ORAL | 3 refills | Status: DC

## 2022-12-06 NOTE — Progress Notes (Signed)
Peer to Peer Review with Insurance MD- Dr Barnabas Lister is successful - SNF rehab approved Shon Hale, MD

## 2022-12-06 NOTE — TOC Progression Note (Signed)
Transition of Care [facility]) - Progression Note    Patient Details  Name: Stefanie [name] MRN: [id number] Date of Birth: 07/25/1940  [facility] CM/SW Contact  Leitha Bleak, RN Phone Number: 12/06/2022, 10:07 AM  Clinical Narrative:   Discussed bed offers, Granddaughter wants [name]. TOC following for INS AUTH.    Expected Discharge Plan: Skilled Nursing Facility Barriers to Discharge: Continued Medical Work up  Expected Discharge Plan and Services In-house Referral: Clinical Social Work   Post Acute Care Choice: Skilled Nursing Facility Living arrangements for the past 2 months: Skilled Nursing Facility, Assisted Living Facility Expected Discharge Date: 12/06/22                     Social Determinants of Health (SDOH) Interventions SDOH Screenings   Food Insecurity: Food Insecurity Present (10/22/2022)  Housing: Low Risk  (10/22/2022)  Transportation Needs: No Transportation Needs (10/22/2022)  Utilities: Not At Risk (10/22/2022)  Tobacco Use: Low Risk  (12/03/2022)    Readmission Risk Interventions    12/05/2022    1:45 PM 10/25/2022   11:16 AM  Readmission Risk Prevention Plan  Transportation Screening Complete Complete  Home Care Screening  Complete  Medication Review (RN CM)  Complete  HRI or Home Care Consult Complete   Social Work Consult for Recovery Care Planning/Counseling Complete   Palliative Care Screening Not Applicable   Medication Review Oceanographer) Complete

## 2022-12-06 NOTE — Care Management Important Message (Signed)
Important Message  Patient Details  Name: Mariela Kuzia MRN: [id number] Date of Birth: 1941-02-02   Medicare Important Message Given:  N/A - LOS <3 / Initial given by admissions     Corey Harold 12/06/2022, 10:36 AM

## 2022-12-06 NOTE — Progress Notes (Signed)
Mobility Specialist Progress Note:    12/06/22 1117  Mobility  Activity  (Stretches and Exercises)  Level of Assistance Standby assist, set-up cues, supervision of patient - no hands on  Assistive Device None  Range of Motion/Exercises Active Assistive;Left leg  LLE Weight Bearing WBAT  Activity Response Tolerated well  Mobility Referral Yes  $Mobility charge 1 Mobility  Mobility Specialist Start Time (ACUTE ONLY) 1100  Mobility Specialist Stop Time (ACUTE ONLY) 1115  Mobility Specialist Time Calculation (min) (ACUTE ONLY) 15 min   Pt received in chair, declined ambulation in room/hallway. Pt agreeable to demonstrating rehab exercises for LLE in chair to improve strength. Tolerated well, c/o leg discomfort. Left pt in chair, all needs met.   Feliciana Rossetti Mobility Specialist Please contact via Special educational needs teacher or  Rehab office at [phone]

## 2022-12-06 NOTE — Discharge Summary (Addendum)
Stefanie [name], is a 82 y.o. female  DOB 1940/11/10  MRN [id number].  Admission date:  12/03/2022  Admitting Physician  Levie Heritage, DO  Discharge Date:  12/07/2022   Primary MD  [name], MD  Recommendations for primary care physician for things to follow:   1)Follow up with Orthopedic Surgeon---Dr. Fuller Canada, MD---in 2 weeks for Displaced avulsion fracture of the medial malleolus-- -Address: [address] [place], [place] [address] Phone: [phone] --Nonweightbearing status at this time on left lower extremity  2)Avoid ibuprofen/Advil/Aleve/Motrin/Goody Powders/Naproxen/BC powders/Meloxicam/Diclofenac/Indomethacin and other Nonsteroidal anti-inflammatory medications as these will make you more likely to bleed and can cause stomach ulcers, can also cause Kidney problems.   3)Repeat CBC Blood test in 1 week  Admission Diagnosis  Abscess [L02.91] Cellulitis of left lower extremity [L03.116] Closed displaced fracture of body of left calcaneus, initial encounter [S92.012A] Cellulitis and abscess of left lower extremity [L03.116, L02.416]   Discharge Diagnosis  Abscess [L02.91] Cellulitis of left lower extremity [L03.116] Closed displaced fracture of body of left calcaneus, initial encounter [S92.012A] Cellulitis and abscess of left lower extremity [L03.116, L02.416]    Principal Problem:   Cellulitis and abscess of left lower extremity Active Problems:   Hypothyroidism   HYPERTENSION   Displaced fracture of medial malleolus of left tibia, initial encounter for closed fracture      Past Medical History:  Diagnosis Date   Anxiety    Arthritis    Colon polyps    Depression    Diverticulosis of colon (without mention of hemorrhage)    Fibromyalgia    GERD (gastroesophageal reflux disease)    Hiatal hernia    History of IBS    History of kidney stones    Hypothyroidism    Iron  deficiency anemia due to chronic blood loss 11/15/2022    Past Surgical History:  Procedure Laterality Date   ABDOMINAL HYSTERECTOMY     ANKLE SURGERY     bilateral    APPENDECTOMY  1985   BREAST BIOPSY Left    Benign    CHOLECYSTECTOMY  1985   ESOPHAGEAL MANOMETRY N/A 10/22/2012   Procedure: ESOPHAGEAL MANOMETRY (EM);  Surgeon: Mardella Layman, MD;  Location: [facility] ENDOSCOPY;  Service: Endoscopy;  Laterality: N/A;   ESOPHAGOGASTRODUODENOSCOPY N/A 10/01/2022   Procedure: ESOPHAGOGASTRODUODENOSCOPY (EGD);  Surgeon: [name], MD;  Location: [facility] ENDOSCOPY;  Service: Gastroenterology;  Laterality: N/A;   EXTRACORPOREAL SHOCK WAVE LITHOTRIPSY Left 11/26/2018   Procedure: EXTRACORPOREAL SHOCK WAVE LITHOTRIPSY (ESWL);  Surgeon: Marcine Matar, MD;  Location: [facility] ORS;  Service: Urology;  Laterality: Left;   HEMORRHOID SURGERY     IR IVC FILTER PLMT / S&I /IMG GUID/MOD SED  10/24/2022   RECTOCELE REPAIR     TUBAL LIGATION       HPI  from the history and physical done on the day of admission:   HPI: Stefanie [name] is a 82 y.o. female with medical history significant of hypothyroidism, fibromyalgia, GERD, diverticulosis, IBS, history of DVT, anemia of chronic disease.  Patient presents  with redness and swelling to the left lower extremity.  This was first noticed yesterday with a sore that started to open up.  The facility where she lives did a x-ray which showed a possible fracture, so she was sent here for evaluation.  She denies fevers, chills, nausea, vomiting.   Per the patient's granddaughter, the patient has been having trouble with her left ankle for several months.  She first injured her ankle as a sprain.  She has had several x-rays, none of which have seen the fracture.   Review of Systems: As mentioned in the history of present illness. All other systems reviewed and are negative.    Hospital Course:   Brief Narrative:  82 y.o. 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 Barriers to discharge: Patient is medically stable for discharge, she is awaiting insurance authorization for transfer to SNF rehab   -Assessment and Plan: 1) Left Leg Cellulitis -on tibial side of the lower leg, status post I&D on 12/03/2022 -Clinically much improved--please see photos below -Wound culture with mostly pansensitive MSSA -De-escalated to IV Ancef and p.o. doxycycline from IV Rocephin and Vanco on -Okay to discharge on oral Keflex and doxycycline for additional 5 days   2)Displaced avulsion fracture of the medial malleolus-- Orthopedic consult from Dr. Fuller Canada appreciated, recommends follow-up in the future with foot and ankle specialist -Nonweightbearing status at this time on left lower extremity -Orthopedic surgeon discussed case with vascular surgeon Dr. Sherral Hammers -Continue pain control -Nonoperative management at this time -Outpatient follow-up with Dr. Fuller Canada in 2 weeks advised   3)Social/Ethics----plan of care and advanced directive discussed with patient and  patient's granddaughter [name] at [phone] -- Patient is a full code -She would like her granddaughter [name] to be decision-maker if she is no longer able to make any decisions for herself   4) Acute Lt LL DVT --diagnosed 10/22/2022  -in the patient with history of recurrent DVTs of the left lower extremity -Patient's brother also has recurrent DVTs --On 10/24/2022 and underwent [facility]C [facility] procedure at [facility] --Given recurrent DVT and strong family history of DVT high bleeding risk IVC  filter was placed-- -as per Dr. Ellin Saba continue full anticoagulation for up to 3 months if no bleeding -Continue Eliquis 5 mg twice daily for now - 5)GERD--recent heme positive stool -Incomplete EGD on 10/01/22 due to poor prep -Patient declines further endoluminal evaluation -Continue Protonix   6)Chronic Anemia----Hgb stable at 9.1 today -Monitor closely while on anticoagulation   7)Depression Continue Lexapro 20 mg qhs -May use trazodone nightly as needed   5)Hypothyroidism Continue synthroid 75 mcg at bedtime   6)Generalized weakness and ambulatory dysfunction--- PT eval appreciated recommends SNF rehab Nonweightbearing status at this time on left lower extremity   Disposition: The patient is from: ALF --[facility]              Anticipated d/c is to: SNF                    Patient currently is  medically stable to d/c. Barriers: Patient is medically stable for discharge, she is awaiting insurance authorization for transfer to  SNF rehab   Code Status :  -  Code Status: Full Code    Family Communication:     (patient is alert, awake and coherent)  -patient's granddaughter [name] at [phone]   Discharge Condition: stable  Follow UP   Contact information for follow-up providers     Vickki Hearing, MD. Schedule an appointment as soon as possible for a visit in 2 week(s).   Specialties: Orthopedic Surgery, Radiology Contact information: [address] [place] [address] [phone]              Contact information for after-discharge care     Destination     [facility] Preferred SNF .   Service: Skilled Nursing Contact information: [address] [place] [address] [phone]                     Consults obtained -orthopedics  Diet and Activity recommendation:  As advised  Discharge Instructions    Discharge Instructions     Call MD for:  difficulty breathing, headache or visual  disturbances   Complete by: As directed    Call MD for:  persistant dizziness or light-headedness   Complete by: As directed    Call MD for:  persistant nausea and vomiting   Complete by: As directed    Call MD for:  severe uncontrolled pain   Complete by: As directed    Call MD for:  temperature >100.4   Complete by: As directed    Diet - low sodium heart healthy   Complete by: As directed    Discharge instructions   Complete by: As directed    1)Follow up with Orthopedic Surgeon---Dr. Fuller Canada, MD---in 2 weeks for Displaced avulsion fracture of the medial malleolus-- -Address: [address] [place], [place] [address] Phone: [phone] --Nonweightbearing status at this time on left lower extremity  2)Avoid ibuprofen/Advil/Aleve/Motrin/Goody Powders/Naproxen/BC powders/Meloxicam/Diclofenac/Indomethacin and other Nonsteroidal anti-inflammatory medications as these will make you more likely to bleed and can cause stomach ulcers, can also cause Kidney problems.   3)Repeat CBC Blood test in 1 week   Increase activity slowly   Complete by: As directed        Discharge Medications    Allergies as of 12/07/2022       Reactions   Gabapentin     Dizziness. States made her feel like she was drunk. Unsteady gait and dizziness.      Lidocaine Rash   Metoclopramide Rash   Other Other (See Comments)   DYE+Nitrofurantoin+Brilliant Blue Fcf   Sulfa Antibiotics Rash   Z-pak [azithromycin] Rash   Metoclopramide Hcl Other (See Comments)   REACTION: a drawing of all her muscles   Penicillins Other (See Comments)   Unknown    Procaine Hcl Other (See Comments)   Unknown    Macrodantin [nitrofurantoin Macrocrystal] Rash        Medication List     TAKE these medications    acetaminophen 325 MG tablet Commonly known as: Tylenol Take 2 tablets (650 mg total) by mouth every 6 (six) hours as needed for mild pain or fever. What changed:  medication strength how much to  take reasons to take this   apixaban 5 MG Tabs tablet Commonly known as: ELIQUIS Take 1 tablet (5 mg total) by mouth 2 (two) times daily. What changed:  how much to take how to take this when to take this additional instructions   cephALEXin 500  MG capsule Commonly known as: Keflex Take 1 capsule (500 mg total) by mouth 3 (three) times daily for 5 days.   doxycycline 100 MG tablet Commonly known as: VIBRA-TABS Take 1 tablet (100 mg total) by mouth 2 (two) times daily for 5 days.   escitalopram 20 MG tablet Commonly known as: LEXAPRO Take 20 mg by mouth daily.   fexofenadine 60 MG tablet Commonly known as: ALLEGRA Take 1 tablet (60 mg total) by mouth 2 (two) times daily as needed for allergies or rhinitis.   levothyroxine 75 MCG tablet Commonly known as: SYNTHROID Take 75 mcg by mouth at bedtime.   pantoprazole 40 MG tablet Commonly known as: Protonix Take 1 tablet (40 mg total) by mouth daily. What changed: when to take this   polyethylene glycol 17 g packet Commonly known as: MIRALAX / GLYCOLAX Take 17 g by mouth daily as needed. What changed:  when to take this reasons to take this   potassium chloride 10 MEQ tablet Commonly known as: KLOR-CON Take 10 mEq by mouth daily.   traZODone 50 MG tablet Commonly known as: DESYREL Take 1 tablet (50 mg total) by mouth at bedtime as needed for sleep.   Vitamin D (Ergocalciferol) 1.25 MG (50000 UNIT) Caps capsule Commonly known as: DRISDOL Take 50,000 Units by mouth every 7 (seven) days.       Major procedures and Radiology Reports - PLEASE review detailed and final reports for all details, in brief -   DG Ankle Complete Left  Result Date: 12/03/2022 CLINICAL DATA:  Wound. Facility states patient has an open area to the inside of the left ankle with concern for cellulitis. EXAM: LEFT ANKLE COMPLETE - 3+ VIEW COMPARISON:  Radiograph 10/22/2022, 08/27/2022 FINDINGS: Acute moderately displaced fracture of the  posterior process of the calcaneus. The fracture fragment is displaced approximately 1.5 cm superiorly. Bone anchor is noted inferior to the fracture line. Diffuse osteopenia. Material external to the patient, possibly the patient's sock, obscures fine osseous and soft tissue detail. No subcutaneous emphysema. No radiopaque foreign body in the soft tissues. IMPRESSION: 1. Acute moderately displaced avulsion fracture of the posterior process of the calcaneus. 2. No subcutaneous emphysema to correlate with reported wound though fine soft tissue detail is obscured by material overlying the midfoot to forefoot. Electronically Signed   By: Sherron Ales M.D.   On: 12/03/2022 19:14    Micro Results   Recent Results (from the past 240 hour(s))  Aerobic Culture w Gram Stain (superficial specimen)     Status: None   Collection Time: 12/03/22  6:10 PM   Specimen: Wound  Result Value Ref Range Status   Specimen Description   Final    WOUND Performed at [facility], [address], [place], [place] [address]    Special Requests   Final    NONE Performed at [facility], [address], [place], [place] [address]    Gram Stain   Final    NO WBC SEEN NO ORGANISMS SEEN Performed at [facility], [address], [place], [place] [address]    Culture FEW STAPHYLOCOCCUS AUREUS  Final   Report Status 12/05/2022 FINAL  Final   Organism ID, Bacteria STAPHYLOCOCCUS AUREUS  Final      Susceptibility   Staphylococcus aureus - MIC*    CIPROFLOXACIN >=8 RESISTANT Resistant     ERYTHROMYCIN >=8 RESISTANT Resistant     GENTAMICIN <=0.5 SENSITIVE Sensitive     OXACILLIN 0.5 SENSITIVE Sensitive  TETRACYCLINE <=1 SENSITIVE Sensitive     VANCOMYCIN <=0.5 SENSITIVE Sensitive     TRIMETH/SULFA >=320 RESISTANT Resistant     CLINDAMYCIN <=0.25 SENSITIVE Sensitive     RIFAMPIN <=0.5 SENSITIVE Sensitive     Inducible Clindamycin NEGATIVE Sensitive     LINEZOLID 2 SENSITIVE Sensitive     * FEW STAPHYLOCOCCUS  AUREUS  MRSA Next Gen by PCR, Nasal     Status: None   Collection Time: 12/03/22  8:06 PM   Specimen: Nasal Mucosa; Nasal Swab  Result Value Ref Range Status   MRSA by PCR Next Gen NOT DETECTED NOT DETECTED Final    Comment: (NOTE) The GeneXpert MRSA Assay (FDA approved for NASAL specimens only), is one component of a comprehensive MRSA colonization surveillance program. It is not intended to diagnose MRSA infection nor to guide or monitor treatment for MRSA infections. Test performance is not FDA approved in patients less than 79 years old. Performed at [facility], [address], [place], [place] [address]    Today   Subjective    Stefanie [name] today has no new complaints No fever  Or chills   No Nausea, Vomiting or Diarrhea  12/06/22 -Patient currently is  medically stable to d/c. Barriers to discharge: Patient is medically stable for discharge, she is awaiting insurance authorization for transfer to SNF rehab  Patient has been seen and examined prior to discharge   Objective   Blood pressure (!) 106/50, pulse 61, temperature 98.1 F (36.7 C), temperature source Oral, resp. rate 17, height 5\' 4"  (1.626 m), weight 67.6 kg, SpO2 95 %.   Intake/Output Summary (Last 24 hours) at 12/07/2022 1008 Last data filed at 12/07/2022 0316 Gross per 24 hour  Intake 800 ml  Output 400 ml  Net 400 ml    Exam Gen:- Awake Alert,  in no apparent distress  HEENT:- [place].AT, No sclera icterus Neck-Supple Neck,No JVD,.  Lungs-  CTAB , fair symmetrical air movement CV- S1, S2 normal, regular  Abd-  +ve B.Sounds, Abd Soft, No tenderness,    Extremity--pedal pulses present  Psych-affect is appropriate, oriented x3 Neuro-Generalized weakness, no new focal deficits, no tremors MSK-Lt leg Tibia side wound with mostly with erythema, much improved swelling and mostly resolved tenderness- Please see updated photos below -  Media Information  Document Information  Photos  Lt Foot  12/06/2022  09:26  Attached To:  Hospital Encounter on 12/03/22  Source Information  Shon Hale, MD  Ap-Dept 300     Data Review   CBC w Diff:  Lab Results  Component Value Date   WBC 8.2 12/06/2022   HGB 9.1 (L) 12/06/2022   HCT 30.7 (L) 12/06/2022   PLT 346 12/06/2022   LYMPHOPCT 26 12/03/2022   MONOPCT 14 12/03/2022   EOSPCT 3 12/03/2022   BASOPCT 1 12/03/2022   CMP:  Lab Results  Component Value Date   NA 138 12/06/2022   K 3.8 12/06/2022   CL 102 12/06/2022   CO2 28 12/06/2022   BUN 10 12/06/2022   CREATININE 0.66 12/06/2022   PROT 6.2 (L) 09/30/2022   ALBUMIN 3.1 (L) 09/30/2022   BILITOT 0.6 09/30/2022   ALKPHOS 105 09/30/2022   AST 14 (L) 09/30/2022   ALT 10 09/30/2022  . Total Discharge time is about 40 minutes  Shon Hale MD  Updated DC Summary on 12/07/22 by Rodney Langton, MD  Go to [email or web address] -  for contact info  [facility] - Office  [phone]

## 2022-12-06 NOTE — Discharge Instructions (Addendum)
1)Follow up with Orthopedic Surgeon---Dr. Fuller Canada, MD---in 2 weeks for Displaced avulsion fracture of the medial malleolus-- -Address: [address] [place], [place] [address] Phone: [phone] --Nonweightbearing status at this time on left lower extremity  2)Avoid ibuprofen/Advil/Aleve/Motrin/Goody Powders/Naproxen/BC powders/Meloxicam/Diclofenac/Indomethacin and other Nonsteroidal anti-inflammatory medications as these will make you more likely to bleed and can cause stomach ulcers, can also cause Kidney problems.   3)Repeat CBC Blood test in 1 week

## 2022-12-07 DIAGNOSIS — S8252XA Displaced fracture of medial malleolus of left tibia, initial encounter for closed fracture: Secondary | ICD-10-CM

## 2022-12-07 NOTE — Progress Notes (Signed)
Mobility Specialist Progress Note:    12/07/22 1055  Mobility  Activity Stood at bedside  Level of Assistance Maximum assist, patient does 25-49%  Assistive Device Front wheel walker  LLE Weight Bearing TWB  Activity Response Tolerated well  Mobility Referral Yes  $Mobility charge 1 Mobility  Mobility Specialist Start Time (ACUTE ONLY) 1045  Mobility Specialist Stop Time (ACUTE ONLY) 1100  Mobility Specialist Time Calculation (min) (ACUTE ONLY) 15 min   Pt received in chair agreeable to mobility session. Stood at chair side, required MaxA with RW. Required several cues for safety when bearing bearing weight on feet. Tolerated well, asx throughout. NT notified. Left pt in chair, alarm on, call bell in reach, all needs met.   Feliciana Rossetti Mobility Specialist Please contact via Special educational needs teacher or  Rehab office at [phone]

## 2022-12-07 NOTE — TOC Transition Note (Signed)
Transition of Care [facility]) - CM/SW Discharge Note   Patient Details  Name: Stefanie [name] MRN: [id number] Date of Birth: Jul 10, 1940  Transition of Care [facility]) CM/SW Contact:  Leitha Bleak, RN Phone Number: 12/07/2022, 10:41 AM   Clinical Narrative:   Berkley Harvey received too late for patient to discharge to [facility] yesterday. Room number provided and DC summary sent . RN calling report. TOC called EMS. Patient is ready for transport.    Final next level of care: Skilled Nursing Facility Barriers to Discharge: Barriers Resolved   Patient Goals and CMS Choice CMS Medicare.gov Compare Post Acute Care list provided to:: Patient Represenative (must comment) Choice offered to / list presented to : Adult Children  Discharge Placement                 Patient to be transferred to facility by: EMS Name of family member notified: left [name]- daughter a message Patient and family notified of of transfer: 12/07/22  Discharge Plan and Services Additional resources added to the After Visit Summary for   In-house Referral: Clinical Social Work   Post Acute Care Choice: Skilled Nursing Facility                Social Determinants of Health (SDOH) Interventions SDOH Screenings   Food Insecurity: Food Insecurity Present (10/22/2022)  Housing: Low Risk  (10/22/2022)  Transportation Needs: No Transportation Needs (10/22/2022)  Utilities: Not At Risk (10/22/2022)  Tobacco Use: Low Risk  (12/03/2022)     Readmission Risk Interventions    12/05/2022    1:45 PM 10/25/2022   11:16 AM  Readmission Risk Prevention Plan  Transportation Screening Complete Complete  Home Care Screening  Complete  Medication Review (RN CM)  Complete  HRI or Home Care Consult Complete   Social Work Consult for Recovery Care Planning/Counseling Complete   Palliative Care Screening Not Applicable   Medication Review Oceanographer) Complete

## 2022-12-07 NOTE — Progress Notes (Signed)
Attempted to call report x2. Gave nurse my number to call back.

## 2022-12-07 NOTE — Progress Notes (Signed)
Nsg Discharge Note  Admit Date:  12/03/2022 Discharge date: 12/07/2022   Raelyn Ensign to be D/C'd Skilled nursing facility per MD order.  AVS complete Patient/caregiver able to verbalize understanding.  Discharge Medication: Allergies as of 12/07/2022       Reactions   Gabapentin     Dizziness. States made her feel like she was drunk. Unsteady gait and dizziness.      Lidocaine Rash   Metoclopramide Rash   Other Other (See Comments)   DYE+Nitrofurantoin+Brilliant Blue Fcf   Sulfa Antibiotics Rash   Z-pak [azithromycin] Rash   Metoclopramide Hcl Other (See Comments)   REACTION: a drawing of all her muscles   Penicillins Other (See Comments)   Unknown    Procaine Hcl Other (See Comments)   Unknown    Macrodantin [nitrofurantoin Macrocrystal] Rash        Medication List     TAKE these medications    acetaminophen 325 MG tablet Commonly known as: Tylenol Take 2 tablets (650 mg total) by mouth every 6 (six) hours as needed for mild pain or fever. What changed:  medication strength how much to take reasons to take this   apixaban 5 MG Tabs tablet Commonly known as: ELIQUIS Take 1 tablet (5 mg total) by mouth 2 (two) times daily. What changed:  how much to take how to take this when to take this additional instructions   cephALEXin 500 MG capsule Commonly known as: Keflex Take 1 capsule (500 mg total) by mouth 3 (three) times daily for 5 days.   doxycycline 100 MG tablet Commonly known as: VIBRA-TABS Take 1 tablet (100 mg total) by mouth 2 (two) times daily for 5 days.   escitalopram 20 MG tablet Commonly known as: LEXAPRO Take 20 mg by mouth daily.   fexofenadine 60 MG tablet Commonly known as: ALLEGRA Take 1 tablet (60 mg total) by mouth 2 (two) times daily as needed for allergies or rhinitis.   levothyroxine 75 MCG tablet Commonly known as: SYNTHROID Take 75 mcg by mouth at bedtime.   pantoprazole 40 MG tablet Commonly known as: Protonix Take 1  tablet (40 mg total) by mouth daily. What changed: when to take this   polyethylene glycol 17 g packet Commonly known as: MIRALAX / GLYCOLAX Take 17 g by mouth daily as needed. What changed:  when to take this reasons to take this   potassium chloride 10 MEQ tablet Commonly known as: KLOR-CON Take 10 mEq by mouth daily.   traZODone 50 MG tablet Commonly known as: DESYREL Take 1 tablet (50 mg total) by mouth at bedtime as needed for sleep.   Vitamin D (Ergocalciferol) 1.25 MG (50000 UNIT) Caps capsule Commonly known as: DRISDOL Take 50,000 Units by mouth every 7 (seven) days.        Discharge Assessment: Vitals:   12/06/22 2018 12/07/22 0324  BP: (!) 113/48 (!) 106/50  Pulse: 72 61  Resp: 17 17  Temp: 98.6 F (37 C) 98.1 F (36.7 C)  SpO2: 96% 95%   Skin clean, dry and intact without evidence of skin break down, no evidence of skin tears noted. IV catheter discontinued intact. Site without signs and symptoms of complications - no redness or edema noted at insertion site, patient denies c/o pain - only slight tenderness at site.  Dressing with slight pressure applied.  D/c Instructions-Education: Discharge instructions given to patient/family with verbalized understanding. D/c education completed with patient/family including follow up instructions, medication list, d/c activities limitations if indicated,  with other d/c instructions as indicated by MD - patient able to verbalize understanding, all questions fully answered. Patient instructed to return to ED, call 911, or call MD for any changes in condition.  Patient escorted via WC, and D/C home via private auto.  Laurena Spies, RN 12/07/2022 10:16 AM

## 2022-12-09 ENCOUNTER — Ambulatory Visit: Payer: Medicare Other | Admitting: Physician Assistant

## 2022-12-13 ENCOUNTER — Inpatient Hospital Stay: Payer: No Typology Code available for payment source | Attending: Hematology

## 2022-12-13 DIAGNOSIS — I82452 Acute embolism and thrombosis of left peroneal vein: Secondary | ICD-10-CM | POA: Insufficient documentation

## 2022-12-13 DIAGNOSIS — Z90710 Acquired absence of both cervix and uterus: Secondary | ICD-10-CM | POA: Insufficient documentation

## 2022-12-13 DIAGNOSIS — I82552 Chronic embolism and thrombosis of left peroneal vein: Secondary | ICD-10-CM | POA: Insufficient documentation

## 2022-12-13 DIAGNOSIS — Z881 Allergy status to other antibiotic agents status: Secondary | ICD-10-CM | POA: Insufficient documentation

## 2022-12-13 DIAGNOSIS — Z882 Allergy status to sulfonamides status: Secondary | ICD-10-CM | POA: Insufficient documentation

## 2022-12-13 DIAGNOSIS — Z7901 Long term (current) use of anticoagulants: Secondary | ICD-10-CM | POA: Insufficient documentation

## 2022-12-13 DIAGNOSIS — F32A Depression, unspecified: Secondary | ICD-10-CM | POA: Insufficient documentation

## 2022-12-13 DIAGNOSIS — M1990 Unspecified osteoarthritis, unspecified site: Secondary | ICD-10-CM | POA: Insufficient documentation

## 2022-12-13 DIAGNOSIS — Z888 Allergy status to other drugs, medicaments and biological substances status: Secondary | ICD-10-CM | POA: Insufficient documentation

## 2022-12-13 DIAGNOSIS — K219 Gastro-esophageal reflux disease without esophagitis: Secondary | ICD-10-CM | POA: Insufficient documentation

## 2022-12-13 DIAGNOSIS — R600 Localized edema: Secondary | ICD-10-CM | POA: Insufficient documentation

## 2022-12-13 DIAGNOSIS — Z993 Dependence on wheelchair: Secondary | ICD-10-CM | POA: Insufficient documentation

## 2022-12-13 DIAGNOSIS — E669 Obesity, unspecified: Secondary | ICD-10-CM | POA: Insufficient documentation

## 2022-12-13 DIAGNOSIS — D509 Iron deficiency anemia, unspecified: Secondary | ICD-10-CM | POA: Insufficient documentation

## 2022-12-13 DIAGNOSIS — R5383 Other fatigue: Secondary | ICD-10-CM | POA: Insufficient documentation

## 2022-12-13 DIAGNOSIS — Z79899 Other long term (current) drug therapy: Secondary | ICD-10-CM | POA: Insufficient documentation

## 2022-12-13 DIAGNOSIS — F419 Anxiety disorder, unspecified: Secondary | ICD-10-CM | POA: Insufficient documentation

## 2022-12-13 DIAGNOSIS — Z83719 Family history of colon polyps, unspecified: Secondary | ICD-10-CM | POA: Insufficient documentation

## 2022-12-13 DIAGNOSIS — Z87442 Personal history of urinary calculi: Secondary | ICD-10-CM | POA: Insufficient documentation

## 2022-12-13 DIAGNOSIS — D6851 Activated protein C resistance: Secondary | ICD-10-CM | POA: Insufficient documentation

## 2022-12-13 DIAGNOSIS — M797 Fibromyalgia: Secondary | ICD-10-CM | POA: Insufficient documentation

## 2022-12-13 DIAGNOSIS — E039 Hypothyroidism, unspecified: Secondary | ICD-10-CM | POA: Insufficient documentation

## 2022-12-13 DIAGNOSIS — I82442 Acute embolism and thrombosis of left tibial vein: Secondary | ICD-10-CM | POA: Insufficient documentation

## 2022-12-13 DIAGNOSIS — I82432 Acute embolism and thrombosis of left popliteal vein: Secondary | ICD-10-CM | POA: Insufficient documentation

## 2022-12-13 DIAGNOSIS — K5900 Constipation, unspecified: Secondary | ICD-10-CM | POA: Insufficient documentation

## 2022-12-13 DIAGNOSIS — I82532 Chronic embolism and thrombosis of left popliteal vein: Secondary | ICD-10-CM | POA: Insufficient documentation

## 2022-12-13 DIAGNOSIS — Z8719 Personal history of other diseases of the digestive system: Secondary | ICD-10-CM | POA: Insufficient documentation

## 2022-12-13 DIAGNOSIS — K589 Irritable bowel syndrome without diarrhea: Secondary | ICD-10-CM | POA: Insufficient documentation

## 2022-12-13 DIAGNOSIS — Z9049 Acquired absence of other specified parts of digestive tract: Secondary | ICD-10-CM | POA: Insufficient documentation

## 2022-12-13 DIAGNOSIS — R519 Headache, unspecified: Secondary | ICD-10-CM | POA: Insufficient documentation

## 2022-12-13 DIAGNOSIS — Z8672 Personal history of thrombophlebitis: Secondary | ICD-10-CM | POA: Insufficient documentation

## 2022-12-13 DIAGNOSIS — G3184 Mild cognitive impairment, so stated: Secondary | ICD-10-CM | POA: Insufficient documentation

## 2022-12-13 DIAGNOSIS — I82562 Chronic embolism and thrombosis of left calf muscular vein: Secondary | ICD-10-CM | POA: Insufficient documentation

## 2022-12-19 ENCOUNTER — Ambulatory Visit: Payer: Medicare Other | Admitting: Orthopedic Surgery

## 2022-12-26 ENCOUNTER — Ambulatory Visit: Payer: Medicare Other | Admitting: Orthopedic Surgery

## 2022-12-27 NOTE — Progress Notes (Signed)
[facility] [address], [place] [address]   CLINIC:  Medical Oncology/Hematology  PCP:  [name], MD [address] [place] [address] [phone]   REASON FOR VISIT:  Follow-up for iron deficiency anemia and left lower extremity DVT  CURRENT THERAPY: Eliquis + IV Feraheme  INTERVAL HISTORY:   Stefanie [name] 82 y.o. female returns for routine follow-up of her iron deficiency anemia and left lower extremity DVT.  She was seen for initial consultation by Dr. Ellin Saba and Rojelio Brenner PA-C on 11/15/2022.  Since her last visit, she was hospitalized from 12/03/2022 through 12/07/2022 for left lower extremity cellulitis and displaced avulsion fracture of left medial malleolus.  Stefanie [name] is unaccompanied at today's visit.  She is somewhat of a poor historian, with impaired recent memory and difficulty with word finding.  Overall, she reports that she feels "pretty good" at today's visit.   She has been taking Eliquis since hospital discharge on 10/27/2022. She denies any rectal bleeding, melena, or other signs of major bleeding.  Her left leg edema has improved.  She denies any shortness of breath, dyspnea, chest pain, hemoptysis, or palpitations.  She denies any major fatigue, pica, lightheadedness, headaches, or syncope.  She has not had any falls since she has been at [facility], but remains wheelchair dependent.  She has 75% energy and 85% appetite. She endorses that she is maintaining a stable weight.   ASSESSMENT & PLAN:  1.  Recurrent LLE DVT - Initial episode of left leg superficial venous thrombophlebitis in January 2024, treated with Eliquis through April 2024. - Venous US left leg (08/24/2022): No evidence of acute or chronic DVT or SVT in left leg - Venous US (09/29/2022): Nonocclusive DVT in left femoral vein - Venous US (09/30/2022): Chronic DVT of left gastrocnemius veins and findings consistent with chronic superficial thrombosis in the  left small saphenous vein - Venous US (10/22/2022) showed new acute occlusive DVT seen in left popliteal, posterior tibial, and peroneal veins. - 10/22/2022-10/27/2022 patient admitted to the hospital with new acute left lower extremity DVT.  Placed on back on Eliquis as of 10/26/2022. IVC placement on 10/24/2022 due to concern for concurrent GI bleeding.  IR recommended a full 6-12 weeks of anticoagulation before stopping therapy for any reason - Concern regarding chronic anticoagulation due to fall risk and suspected GI blood loss.  Per risk/benefit discussion with family during hospitalization in May 2024, family opted for full dose anticoagulation with acceptance of associated risks. - PLAN: Continue full dose Eliquis along with IVC filter for the time being. - For the time being, we will focus on optimizing iron deficiency anemia (see below), but will plan on checking repeat venous US and coagulopathy panel in about 3 months (September 2024) - Continue follow-up with vascular surgery and IR   2.  Iron deficiency anemia - Hgb 10.2 on 07/01/2022 (prior to starting Eliquis for initial episode of superficial thrombophlebitis) - During hospitalization in April 2024, patient had heme positive stool, Hgb down to 7.9.  EGD from 10/01/2022 showed gastritis, but limited evaluation due to large amount of food residue in stomach/duodenum. - During hospitalization in May 2024, Hgb dropped to 6.5, and she was transfused 1 unit PRBC on 10/24/2022.  Family opted to continue anticoagulation after risk/benefit discussion with hospitalist. - Following with [facility] (Dr. Rhea Belton / Hyacinth Meeker, PA-C) - patient declined evaluation with EGD/colonoscopy, but this could be reconsidered in the future if able to hold  Eliquis and continuing to show signs of GI blood loss. - Patient denies any rectal bleeding or melena.  No abnormal fatigue or pica. - She received IV Feraheme x 2 on 11/21/2022 and 11/28/2022  (unable to receive her third dose due to hospitalization) - Labs today (12/28/2022): Hgb 12.1/MCV 98.3, ferritin 55, iron saturation 13% - PLAN: Recommend additional IV iron with Feraheme x 1 (premedicated with IV Pepcid due to history of rash from other medications) - Repeat CBC and iron panel with RTC in 2 months - Continue gastroenterology follow-up with [facility]   3.  Other history - PMH: IBS, GERD, hypothyroidism, fibromyalgia, anxiety/depression, arthritis - SOCIAL: Patient is widowed.  She was living at home alone prior to hospitalization in May 2024.  She currently resides at [facility] for acute rehab.  She denies any alcohol, tobacco, or illicit substance use. - FAMILY: Younger brother has had blood clots, but she does not know the details.  She denies any family history of cancer.  She had one daughter, who passed away at age 46 due to brain aneurysm.     PLAN SUMMARY: >> IV Feraheme x 1 >> Venous US left leg in 2 months >> Labs in 2 months = CBC/D, ferritin, iron/TIBC, D-dimer, lupus anticoagulant, anticardiolipin, beta-2 glycoprotein, PT gene mutation, factor V Leiden >> OFFICE visit in 2 months (TWO weeks after labs/imaging)      REVIEW OF SYSTEMS:   Review of Systems  Constitutional:  Positive for fatigue (waxes and wanes). Negative for appetite change, chills, diaphoresis, fever and unexpected weight change.  HENT:   Negative for lump/mass and nosebleeds.   Eyes:  Negative for eye problems.  Respiratory:  Negative for cough, hemoptysis and shortness of breath.   Cardiovascular:  Negative for chest pain, leg swelling and palpitations.  Gastrointestinal:  Positive for constipation. Negative for abdominal pain, blood in stool, diarrhea, nausea and vomiting.  Genitourinary:  Negative for hematuria.   Skin: Negative.   Neurological:  Positive for headaches. Negative for dizziness and light-headedness.  Hematological:  Does not bruise/bleed easily.     PHYSICAL EXAM:  ECOG  PERFORMANCE STATUS: 3 - Symptomatic, >50% confined to bed /wheelchair dependent  Vitals:   12/28/22 0918  BP: (!) 144/70  Pulse: 70  Resp: 19  Temp: 98.1 F (36.7 C)  SpO2: 96%   Physical Exam Constitutional:      Appearance: Normal appearance. She is obese.     Comments: Presents in wheelchair  HENT:     Head: Normocephalic and atraumatic.     Mouth/Throat:     Mouth: Mucous membranes are moist.  Eyes:     Extraocular Movements: Extraocular movements intact.     Pupils: Pupils are equal, round, and reactive to light.  Cardiovascular:     Rate and Rhythm: Normal rate and regular rhythm.     Pulses: Normal pulses.     Heart sounds: Normal heart sounds.  Pulmonary:     Effort: Pulmonary effort is normal.     Breath sounds: Normal breath sounds.  Abdominal:     General: Bowel sounds are normal.     Palpations: Abdomen is soft.     Tenderness: There is no abdominal tenderness.  Musculoskeletal:        General: No swelling.     Right lower leg: Edema (trace) present.     Left lower leg: Edema (1+) present.  Lymphadenopathy:     Cervical: No cervical adenopathy.  Skin:    General: Skin  is warm and dry.  Neurological:     General: No focal deficit present.     Mental Status: She is alert and oriented to person, place, and time.  Psychiatric:        Mood and Affect: Mood normal.        Behavior: Behavior normal.        Cognition and Memory: She exhibits impaired recent memory.     PAST MEDICAL/SURGICAL HISTORY:  Past Medical History:  Diagnosis Date   Anxiety    Arthritis    Colon polyps    Depression    Diverticulosis of colon (without mention of hemorrhage)    Fibromyalgia    GERD (gastroesophageal reflux disease)    Hiatal hernia    History of IBS    History of kidney stones    Hypothyroidism    Iron deficiency anemia due to chronic blood loss 11/15/2022   Past Surgical History:  Procedure Laterality Date   ABDOMINAL HYSTERECTOMY     ANKLE SURGERY      bilateral    APPENDECTOMY  1985   BREAST BIOPSY Left    Benign    CHOLECYSTECTOMY  1985   ESOPHAGEAL MANOMETRY N/A 10/22/2012   Procedure: ESOPHAGEAL MANOMETRY (EM);  Surgeon: Mardella Layman, MD;  Location: [facility];  Service: Endoscopy;  Laterality: N/A;   ESOPHAGOGASTRODUODENOSCOPY N/A 10/01/2022   Procedure: ESOPHAGOGASTRODUODENOSCOPY (EGD);  Surgeon: [name], MD;  Location: [facility];  Service: Gastroenterology;  Laterality: N/A;   EXTRACORPOREAL SHOCK WAVE LITHOTRIPSY Left 11/26/2018   Procedure: EXTRACORPOREAL SHOCK WAVE LITHOTRIPSY (ESWL);  Surgeon: Marcine Matar, MD;  Location: [facility];  Service: Urology;  Laterality: Left;   HEMORRHOID SURGERY     IR IVC FILTER PLMT / S&I /IMG GUID/MOD SED  10/24/2022   RECTOCELE REPAIR     TUBAL LIGATION      SOCIAL HISTORY:  Social History   Socioeconomic History   Marital status: Widowed    Spouse name: Not on file   Number of children: 1   Years of education: Not on file   Highest education level: Not on file  Occupational History   Occupation: retired    Associate Professor: RETIRED  Tobacco Use   Smoking status: Never   Smokeless tobacco: Never  Vaping Use   Vaping status: Never Used  Substance and Sexual Activity   Alcohol use: No    Alcohol/week: 0.0 standard drinks of alcohol   Drug use: No   Sexual activity: Not on file  Other Topics Concern   Not on file  Social History Narrative   Not on file   Social Determinants of Health   Financial Resource Strain: Low Risk  (07/19/2022)   [facility], [facility]   Overall Financial Resource Strain (CARDIA)    Difficulty of Paying Living Expenses: Not hard at all  Food Insecurity: Food Insecurity Present (10/22/2022)   Hunger Vital Sign    Worried About Running Out of Food in the Last Year: Sometimes true    Ran Out of Food in the Last Year: Sometimes true  Transportation Needs: No Transportation Needs (10/22/2022)   PRAPARE - Therapist, art (Medical): No    Lack of Transportation (Non-Medical): No  Physical Activity: Unknown (07/19/2022)   Received from [facility], [facility]   Exercise Vital Sign    Days of Exercise per Week: 0 days    Minutes of Exercise per Session: Not on file  Stress: Stress Concern  Present (07/19/2022)   Received from [facility], [facility] - Occupational Stress Questionnaire    Feeling of Stress : To some extent  Social Connections: Moderately Integrated (07/19/2022)   Received from [facility], [facility]   Social Network    How would you rate your social network (family, work, friends)?: Adequate participation with social networks  Intimate Partner Violence: Not At Risk (10/22/2022)   Humiliation, Afraid, Rape, and Kick questionnaire    Fear of Current or Ex-Partner: No    Emotionally Abused: No    Physically Abused: No    Sexually Abused: No    FAMILY HISTORY:  Family History  Problem Relation Age of Onset   Colon polyps Brother    Breast cancer Neg Hx     CURRENT MEDICATIONS:  Outpatient Encounter Medications as of 12/28/2022  Medication Sig   acetaminophen (TYLENOL) 325 MG tablet Take 2 tablets (650 mg total) by mouth every 6 (six) hours as needed for mild pain or fever.   apixaban (ELIQUIS) 5 MG TABS tablet Take 1 tablet (5 mg total) by mouth 2 (two) times daily.   escitalopram (LEXAPRO) 20 MG tablet Take 20 mg by mouth daily.   fexofenadine (ALLEGRA) 60 MG tablet Take 1 tablet (60 mg total) by mouth 2 (two) times daily as needed for allergies or rhinitis.   levothyroxine (SYNTHROID, LEVOTHROID) 75 MCG tablet Take 75 mcg by mouth at bedtime.   pantoprazole (PROTONIX) 40 MG tablet Take 1 tablet (40 mg total) by mouth daily.   polyethylene glycol (MIRALAX / GLYCOLAX) 17 g packet Take 17 g by mouth daily as needed.   potassium chloride (KLOR-CON) 10 MEQ tablet Take 10 mEq by mouth daily.   traZODone (DESYREL)  50 MG tablet Take 1 tablet (50 mg total) by mouth at bedtime as needed for sleep.   Vitamin D, Ergocalciferol, (DRISDOL) 1.25 MG (50000 UNIT) CAPS capsule Take 50,000 Units by mouth every 7 (seven) days.   No facility-administered encounter medications on file as of 12/28/2022.    ALLERGIES:  Allergies  Allergen Reactions   Gabapentin      Dizziness. States made her feel like she was drunk. Unsteady gait and dizziness.         Lidocaine Rash   Metoclopramide Rash   Other Other (See Comments)    DYE+Nitrofurantoin+Brilliant Blue Fcf   Sulfa Antibiotics Rash   Z-Pak [Azithromycin] Rash   Metoclopramide Hcl Other (See Comments)    REACTION: a drawing of all her muscles   Penicillins Other (See Comments)    Unknown    Procaine Hcl Other (See Comments)    Unknown    Macrodantin [Nitrofurantoin Macrocrystal] Rash    LABORATORY DATA:  I have reviewed the labs as listed.  CBC    Component Value Date/Time   WBC 7.0 12/28/2022 0827   RBC 4.03 12/28/2022 0827   HGB 12.1 12/28/2022 0827   HCT 39.6 12/28/2022 0827   PLT 256 12/28/2022 0827   MCV 98.3 12/28/2022 0827   MCH 30.0 12/28/2022 0827   MCHC 30.6 12/28/2022 0827   RDW 20.6 (H) 12/28/2022 0827   LYMPHSABS 1.8 12/28/2022 0827   MONOABS 0.7 12/28/2022 0827   EOSABS 0.3 12/28/2022 0827   BASOSABS 0.1 12/28/2022 0827      Latest Ref Rng & Units 12/06/2022    4:03 AM 12/03/2022    5:18 PM 10/31/2022    8:00 AM  CMP  Glucose 70 - 99  mg/dL 295  621  308   BUN 8 - 23 mg/dL 10  12  18    Creatinine 0.44 - 1.00 mg/dL 6.57  8.46  9.62   Sodium 135 - 145 mmol/L 138  136  134   Potassium 3.5 - 5.1 mmol/L 3.8  3.4  3.7   Chloride 98 - 111 mmol/L 102  103  99   CO2 22 - 32 mmol/L 28  26  24    Calcium 8.9 - 10.3 mg/dL 8.2  7.9  8.5     DIAGNOSTIC IMAGING:  I have independently reviewed the relevant imaging and discussed with the patient.   WRAP UP:  All questions were answered. The patient knows to call the clinic with any  problems, questions or concerns.  Medical decision making: Moderate  Time spent on visit: I spent 20 minutes counseling the patient face to face. The total time spent in the appointment was 30 minutes and more than 50% was on counseling.  Carnella Guadalajara, PA-C  12/28/22 10:04 AM

## 2022-12-28 ENCOUNTER — Inpatient Hospital Stay (HOSPITAL_BASED_OUTPATIENT_CLINIC_OR_DEPARTMENT_OTHER): Payer: No Typology Code available for payment source | Admitting: Physician Assistant

## 2022-12-28 ENCOUNTER — Inpatient Hospital Stay: Payer: No Typology Code available for payment source

## 2022-12-28 VITALS — BP 144/70 | HR 70 | Temp 98.1°F | Resp 19

## 2022-12-28 DIAGNOSIS — D6851 Activated protein C resistance: Secondary | ICD-10-CM | POA: Diagnosis not present

## 2022-12-28 DIAGNOSIS — Z9049 Acquired absence of other specified parts of digestive tract: Secondary | ICD-10-CM | POA: Diagnosis not present

## 2022-12-28 DIAGNOSIS — I824Y2 Acute embolism and thrombosis of unspecified deep veins of left proximal lower extremity: Secondary | ICD-10-CM

## 2022-12-28 DIAGNOSIS — E039 Hypothyroidism, unspecified: Secondary | ICD-10-CM | POA: Diagnosis not present

## 2022-12-28 DIAGNOSIS — I82452 Acute embolism and thrombosis of left peroneal vein: Secondary | ICD-10-CM | POA: Diagnosis present

## 2022-12-28 DIAGNOSIS — M797 Fibromyalgia: Secondary | ICD-10-CM | POA: Diagnosis not present

## 2022-12-28 DIAGNOSIS — K219 Gastro-esophageal reflux disease without esophagitis: Secondary | ICD-10-CM | POA: Diagnosis not present

## 2022-12-28 DIAGNOSIS — M1990 Unspecified osteoarthritis, unspecified site: Secondary | ICD-10-CM | POA: Diagnosis not present

## 2022-12-28 DIAGNOSIS — D500 Iron deficiency anemia secondary to blood loss (chronic): Secondary | ICD-10-CM

## 2022-12-28 DIAGNOSIS — K589 Irritable bowel syndrome without diarrhea: Secondary | ICD-10-CM | POA: Diagnosis not present

## 2022-12-28 DIAGNOSIS — I82552 Chronic embolism and thrombosis of left peroneal vein: Secondary | ICD-10-CM | POA: Diagnosis not present

## 2022-12-28 DIAGNOSIS — D509 Iron deficiency anemia, unspecified: Secondary | ICD-10-CM | POA: Diagnosis not present

## 2022-12-28 DIAGNOSIS — G3184 Mild cognitive impairment, so stated: Secondary | ICD-10-CM | POA: Diagnosis not present

## 2022-12-28 DIAGNOSIS — I82432 Acute embolism and thrombosis of left popliteal vein: Secondary | ICD-10-CM | POA: Diagnosis present

## 2022-12-28 DIAGNOSIS — K5900 Constipation, unspecified: Secondary | ICD-10-CM | POA: Diagnosis not present

## 2022-12-28 DIAGNOSIS — I82532 Chronic embolism and thrombosis of left popliteal vein: Secondary | ICD-10-CM | POA: Diagnosis not present

## 2022-12-28 DIAGNOSIS — R519 Headache, unspecified: Secondary | ICD-10-CM | POA: Diagnosis not present

## 2022-12-28 DIAGNOSIS — F419 Anxiety disorder, unspecified: Secondary | ICD-10-CM | POA: Diagnosis not present

## 2022-12-28 DIAGNOSIS — E669 Obesity, unspecified: Secondary | ICD-10-CM | POA: Diagnosis not present

## 2022-12-28 DIAGNOSIS — I82442 Acute embolism and thrombosis of left tibial vein: Secondary | ICD-10-CM | POA: Diagnosis present

## 2022-12-28 DIAGNOSIS — Z993 Dependence on wheelchair: Secondary | ICD-10-CM | POA: Diagnosis not present

## 2022-12-28 DIAGNOSIS — F32A Depression, unspecified: Secondary | ICD-10-CM | POA: Diagnosis not present

## 2022-12-28 DIAGNOSIS — I82562 Chronic embolism and thrombosis of left calf muscular vein: Secondary | ICD-10-CM | POA: Diagnosis not present

## 2022-12-28 DIAGNOSIS — R600 Localized edema: Secondary | ICD-10-CM | POA: Diagnosis not present

## 2022-12-28 DIAGNOSIS — R5383 Other fatigue: Secondary | ICD-10-CM | POA: Diagnosis not present

## 2022-12-28 DIAGNOSIS — Z7901 Long term (current) use of anticoagulants: Secondary | ICD-10-CM | POA: Diagnosis not present

## 2022-12-28 LAB — CBC WITH DIFFERENTIAL/PLATELET
Abs Immature Granulocytes: 0.01 10*3/uL (ref 0.00–0.07)
Basophils Absolute: 0.1 10*3/uL (ref 0.0–0.1)
Basophils Relative: 1 %
Eosinophils Absolute: 0.3 10*3/uL (ref 0.0–0.5)
Eosinophils Relative: 5 %
HCT: 39.6 % (ref 36.0–46.0)
Hemoglobin: 12.1 g/dL (ref 12.0–15.0)
Immature Granulocytes: 0 %
Lymphocytes Relative: 26 %
Lymphs Abs: 1.8 10*3/uL (ref 0.7–4.0)
MCH: 30 pg (ref 26.0–34.0)
MCHC: 30.6 g/dL (ref 30.0–36.0)
MCV: 98.3 fL (ref 80.0–100.0)
Monocytes Absolute: 0.7 10*3/uL (ref 0.1–1.0)
Monocytes Relative: 10 %
Neutro Abs: 4.1 10*3/uL (ref 1.7–7.7)
Neutrophils Relative %: 58 %
Platelets: 256 10*3/uL (ref 150–400)
RBC: 4.03 MIL/uL (ref 3.87–5.11)
RDW: 20.6 % — ABNORMAL HIGH (ref 11.5–15.5)
WBC: 7 10*3/uL (ref 4.0–10.5)
nRBC: 0 % (ref 0.0–0.2)

## 2022-12-28 LAB — IRON AND TIBC
Iron: 44 ug/dL (ref 28–170)
Saturation Ratios: 13 % (ref 10.4–31.8)
TIBC: 335 ug/dL (ref 250–450)
UIBC: 291 ug/dL

## 2022-12-28 LAB — SAMPLE TO BLOOD BANK

## 2022-12-28 LAB — FERRITIN: Ferritin: 55 ng/mL (ref 11–307)

## 2022-12-28 NOTE — Patient Instructions (Signed)
[facility] **VISIT SUMMARY & IMPORTANT INSTRUCTIONS **   You were seen today by Dr. Ellin Saba & Rojelio Brenner PA-C for your anemia and blood clot.    BLOOD CLOT: Continue taking Eliquis twice daily, unless you are having any major bleeding. We will check ultrasound again in September 2024.  ANEMIA We will schedule you for IV iron x 1 dose  FOLLOW-UP APPOINTMENT: Labs and office visit in 2 months  ** Thank you for trusting me with your healthcare!  I strive to provide all of my patients with quality care at each visit.  If you receive a survey for this visit, I would be so grateful to you for taking the time to provide feedback.  Thank you in advance!  ~ Camden Knotek                   Dr. Doreatha Massed   &   Rojelio Brenner, PA-C   - - - - - - - - - - - - - - - - - -    Thank you for choosing [facility] to provide your oncology and hematology care.  To afford each patient quality time with our provider, please arrive at least 15 minutes before your scheduled appointment time.   If you have a lab appointment with the Cancer Center please come in thru the Main Entrance and check in at the main information desk.  You need to re-schedule your appointment should you arrive 10 or more minutes late.  We strive to give you quality time with our providers, and arriving late affects you and other patients whose appointments are after yours.  Also, if you no show three or more times for appointments you may be dismissed from the clinic at the providers discretion.     Again, thank you for choosing [facility].  Our hope is that these requests will decrease the amount of time that you wait before being seen by our physicians.       _____________________________________________________________  Should you have questions after your visit to [facility], please contact our office at [phone] and follow the prompts.  Our office hours are 8:00 a.m. and 4:30 p.m. Monday - Friday.  Please note that voicemails left after 4:00 p.m. may not be returned until the following business day.  We are closed weekends and major holidays.  You do have access to a nurse 24-7, just call the main number to the clinic [phone] and do not press any options, hold on the line and a nurse will answer the phone.    For prescription refill requests, have your pharmacy contact our office and allow 72 hours.

## 2023-01-02 ENCOUNTER — Inpatient Hospital Stay: Payer: No Typology Code available for payment source

## 2023-01-02 VITALS — BP 122/81 | HR 75 | Temp 98.4°F | Resp 18

## 2023-01-02 DIAGNOSIS — D500 Iron deficiency anemia secondary to blood loss (chronic): Secondary | ICD-10-CM

## 2023-01-02 DIAGNOSIS — I82562 Chronic embolism and thrombosis of left calf muscular vein: Secondary | ICD-10-CM | POA: Diagnosis not present

## 2023-01-02 MED ORDER — SODIUM CHLORIDE 0.9 % IV SOLN
510.0000 mg | Freq: Once | INTRAVENOUS | Status: AC
  Administered 2023-01-02: 510 mg via INTRAVENOUS
  Filled 2023-01-02: qty 510

## 2023-01-02 MED ORDER — ACETAMINOPHEN 325 MG PO TABS
650.0000 mg | ORAL_TABLET | Freq: Once | ORAL | Status: AC
  Administered 2023-01-02: 650 mg via ORAL
  Filled 2023-01-02: qty 2

## 2023-01-02 MED ORDER — CETIRIZINE HCL 10 MG PO TABS
10.0000 mg | ORAL_TABLET | Freq: Once | ORAL | Status: AC
  Administered 2023-01-02: 10 mg via ORAL
  Filled 2023-01-02: qty 1

## 2023-01-02 MED ORDER — FAMOTIDINE IN NACL 20-0.9 MG/50ML-% IV SOLN
20.0000 mg | Freq: Once | INTRAVENOUS | Status: AC
  Administered 2023-01-02: 20 mg via INTRAVENOUS

## 2023-01-02 MED ORDER — SODIUM CHLORIDE 0.9 % IV SOLN: Freq: Once | INTRAVENOUS | Status: AC

## 2023-01-02 NOTE — Patient Instructions (Signed)
[facility]  Discharge Instructions: Thank you for choosing [facility] to provide your oncology and hematology care.  If you have a lab appointment with the Cancer Center - please note that after April 8th, 2024, all labs will be drawn in the cancer center.  You do not have to check in or register with the main entrance as you have in the past but will complete your check-in in the cancer center.  Wear comfortable clothing and clothing appropriate for easy access to any Portacath or PICC line.   We strive to give you quality time with your provider. You may need to reschedule your appointment if you arrive late (15 or more minutes).  Arriving late affects you and other patients whose appointments are after yours.  Also, if you miss three or more appointments without notifying the office, you may be dismissed from the clinic at the provider's discretion.      For prescription refill requests, have your pharmacy contact our office and allow 72 hours for refills to be completed.    Today you received an iron infusion called Feraheme   To help prevent nausea and vomiting after your treatment, we encourage you to take your nausea medication as directed.  BELOW ARE SYMPTOMS THAT SHOULD BE REPORTED IMMEDIATELY: *FEVER GREATER THAN 100.4 F (38 C) OR HIGHER *CHILLS OR SWEATING *NAUSEA AND VOMITING THAT IS NOT CONTROLLED WITH YOUR NAUSEA MEDICATION *UNUSUAL SHORTNESS OF BREATH *UNUSUAL BRUISING OR BLEEDING *URINARY PROBLEMS (pain or burning when urinating, or frequent urination) *BOWEL PROBLEMS (unusual diarrhea, constipation, pain near the anus) TENDERNESS IN MOUTH AND THROAT WITH OR WITHOUT PRESENCE OF ULCERS (sore throat, sores in mouth, or a toothache) UNUSUAL RASH, SWELLING OR PAIN  UNUSUAL VAGINAL DISCHARGE OR ITCHING   Items with * indicate a potential emergency and should be followed up as soon as possible or go to the Emergency Department if any problems  should occur.  Please show the CHEMOTHERAPY ALERT CARD or IMMUNOTHERAPY ALERT CARD at check-in to the Emergency Department and triage nurse.  Should you have questions after your visit or need to cancel or reschedule your appointment, please contact [facility] [phone]  and follow the prompts.  Office hours are 8:00 a.m. to 4:30 p.m. Monday - Friday. Please note that voicemails left after 4:00 p.m. may not be returned until the following business day.  We are closed weekends and major holidays. You have access to a nurse at all times for urgent questions. Please call the main number to the clinic [phone] and follow the prompts.  For any non-urgent questions, you may also contact your provider using MyChart. We now offer e-Visits for anyone 74 and older to request care online for non-urgent symptoms. For details visit [email or web address].   Also download the MyChart app! Go to the app store, search "MyChart", open the app, select [facility], and log in with your MyChart username and password.

## 2023-01-02 NOTE — Progress Notes (Signed)
Feraheme infusion given per orders. Patient tolerated it well without problems. Vitals stable and discharged home from clinic via wheelchair. Follow up as scheduled.  

## 2023-01-09 ENCOUNTER — Ambulatory Visit: Payer: Medicare Other | Admitting: Orthopedic Surgery

## 2023-02-13 ENCOUNTER — Encounter (HOSPITAL_COMMUNITY): Payer: Self-pay | Admitting: Orthopedic Surgery

## 2023-02-21 ENCOUNTER — Other Ambulatory Visit (HOSPITAL_COMMUNITY): Payer: Self-pay | Admitting: Orthopedic Surgery

## 2023-02-21 DIAGNOSIS — M25552 Pain in left hip: Secondary | ICD-10-CM

## 2023-02-22 ENCOUNTER — Inpatient Hospital Stay: Payer: Medicare Other | Attending: Hematology

## 2023-02-22 ENCOUNTER — Ambulatory Visit (HOSPITAL_COMMUNITY)
Admission: RE | Admit: 2023-02-22 | Discharge: 2023-02-22 | Disposition: A | Discharge status: Home / Self Care | Payer: Medicare Other | Source: Ambulatory Visit | Attending: Physician Assistant | Admitting: Physician Assistant

## 2023-02-22 DIAGNOSIS — R609 Edema, unspecified: Secondary | ICD-10-CM | POA: Insufficient documentation

## 2023-02-22 DIAGNOSIS — Z83719 Family history of colon polyps, unspecified: Secondary | ICD-10-CM | POA: Diagnosis not present

## 2023-02-22 DIAGNOSIS — Z7901 Long term (current) use of anticoagulants: Secondary | ICD-10-CM | POA: Insufficient documentation

## 2023-02-22 DIAGNOSIS — I82452 Acute embolism and thrombosis of left peroneal vein: Secondary | ICD-10-CM | POA: Diagnosis present

## 2023-02-22 DIAGNOSIS — I824Y2 Acute embolism and thrombosis of unspecified deep veins of left proximal lower extremity: Secondary | ICD-10-CM | POA: Insufficient documentation

## 2023-02-22 DIAGNOSIS — Z79899 Other long term (current) drug therapy: Secondary | ICD-10-CM | POA: Insufficient documentation

## 2023-02-22 DIAGNOSIS — E669 Obesity, unspecified: Secondary | ICD-10-CM | POA: Diagnosis not present

## 2023-02-22 DIAGNOSIS — D509 Iron deficiency anemia, unspecified: Secondary | ICD-10-CM | POA: Diagnosis not present

## 2023-02-22 DIAGNOSIS — Z86718 Personal history of other venous thrombosis and embolism: Secondary | ICD-10-CM | POA: Diagnosis not present

## 2023-02-22 DIAGNOSIS — I82432 Acute embolism and thrombosis of left popliteal vein: Secondary | ICD-10-CM | POA: Insufficient documentation

## 2023-02-22 DIAGNOSIS — D500 Iron deficiency anemia secondary to blood loss (chronic): Secondary | ICD-10-CM

## 2023-02-22 DIAGNOSIS — I82442 Acute embolism and thrombosis of left tibial vein: Secondary | ICD-10-CM | POA: Insufficient documentation

## 2023-02-22 DIAGNOSIS — R5383 Other fatigue: Secondary | ICD-10-CM | POA: Diagnosis not present

## 2023-02-22 DIAGNOSIS — K5900 Constipation, unspecified: Secondary | ICD-10-CM | POA: Insufficient documentation

## 2023-02-22 LAB — CBC WITH DIFFERENTIAL/PLATELET
Abs Immature Granulocytes: 0.06 10*3/uL (ref 0.00–0.07)
Basophils Absolute: 0.1 10*3/uL (ref 0.0–0.1)
Basophils Relative: 1 %
Eosinophils Absolute: 0.3 10*3/uL (ref 0.0–0.5)
Eosinophils Relative: 3 %
HCT: 37.3 % (ref 36.0–46.0)
Hemoglobin: 11.6 g/dL — ABNORMAL LOW (ref 12.0–15.0)
Immature Granulocytes: 1 %
Lymphocytes Relative: 23 %
Lymphs Abs: 2.5 10*3/uL (ref 0.7–4.0)
MCH: 30.5 pg (ref 26.0–34.0)
MCHC: 31.1 g/dL (ref 30.0–36.0)
MCV: 98.2 fL (ref 80.0–100.0)
Monocytes Absolute: 0.7 10*3/uL (ref 0.1–1.0)
Monocytes Relative: 6 %
Neutro Abs: 7.1 10*3/uL (ref 1.7–7.7)
Neutrophils Relative %: 66 %
Platelets: 462 10*3/uL — ABNORMAL HIGH (ref 150–400)
RBC: 3.8 MIL/uL — ABNORMAL LOW (ref 3.87–5.11)
RDW: 15.4 % (ref 11.5–15.5)
WBC: 10.8 10*3/uL — ABNORMAL HIGH (ref 4.0–10.5)
nRBC: 0 % (ref 0.0–0.2)

## 2023-02-22 LAB — IRON AND TIBC
Iron: 40 ug/dL (ref 28–170)
Saturation Ratios: 15 % (ref 10.4–31.8)
TIBC: 269 ug/dL (ref 250–450)
UIBC: 229 ug/dL

## 2023-02-22 LAB — D-DIMER, QUANTITATIVE: D-Dimer, Quant: 1.61 ug{FEU}/mL — ABNORMAL HIGH (ref 0.00–0.50)

## 2023-02-22 LAB — FERRITIN: Ferritin: 175 ng/mL (ref 11–307)

## 2023-02-23 LAB — BETA-2-GLYCOPROTEIN I ABS, IGG/M/A
Beta-2 Glyco I IgG: <9 GPI IgG units (ref 0–20)
Beta-2-Glycoprotein I IgA: <9 GPI IgA units (ref 0–25)
Beta-2-Glycoprotein I IgM: <9 GPI IgM units (ref 0–32)

## 2023-02-23 LAB — CARDIOLIPIN ANTIBODIES, IGG, IGM, IGA
Anticardiolipin IgA: <9 APL U/mL (ref 0–11)
Anticardiolipin IgG: <9 GPL U/mL (ref 0–14)
Anticardiolipin IgM: <9 MPL U/mL (ref 0–12)

## 2023-02-24 LAB — LUPUS ANTICOAGULANT PANEL
DRVVT: 75 s — ABNORMAL HIGH (ref 0.0–47.0)
PTT Lupus Anticoagulant: 35.3 s (ref 0.0–43.5)

## 2023-02-24 LAB — DRVVT MIX: dRVVT Mix: 51.6 s — ABNORMAL HIGH (ref 0.0–40.4)

## 2023-02-24 LAB — DRVVT CONFIRM: dRVVT Confirm: 0.9 ratio (ref 0.8–1.2)

## 2023-03-01 LAB — FACTOR 5 LEIDEN

## 2023-03-03 ENCOUNTER — Ambulatory Visit (HOSPITAL_COMMUNITY)
Admission: RE | Admit: 2023-03-03 | Discharge: 2023-03-03 | Disposition: A | Discharge status: Home / Self Care | Payer: Medicare Other | Source: Ambulatory Visit | Attending: Orthopedic Surgery | Admitting: Orthopedic Surgery

## 2023-03-03 DIAGNOSIS — M25552 Pain in left hip: Secondary | ICD-10-CM | POA: Diagnosis present

## 2023-03-07 LAB — PROTHROMBIN GENE MUTATION

## 2023-03-12 NOTE — Progress Notes (Unsigned)
[facility] [address], [place] [address]   CLINIC:  Medical Oncology/Hematology  PCP:  [name], MD [address] [place] [address] [phone]   REASON FOR VISIT:  Follow-up for iron deficiency anemia and left lower extremity DVT  CURRENT THERAPY: Eliquis + IV Feraheme  INTERVAL HISTORY:   Stefanie [name] 82 y.o. female returns for routine follow-up of her iron deficiency anemia and left lower extremity DVT.  She was last seen by Rojelio Brenner PA-C on 12/28/2022.  She received Feraheme x 1 on 01/02/2023.  At today's visit, she reports feeling ***. *** She continues to recover from her hospitalization in June/July 2024 for left lower extremity cellulitis and displaced avulsion fracture of left medial malleolus.  ***Stefanie [name] is unaccompanied at today's visit.  She is somewhat of a poor historian, ***with impaired recent memory and difficulty with word finding.  ***Overall, she reports that she feels "pretty good" at today's visit.   She has been taking Eliquis since hospital discharge on 10/27/2022. *** ***She denies any rectal bleeding, melena, or other signs of major bleeding.   ***Her left leg edema has improved.  She denies any shortness of breath, dyspnea, chest pain, hemoptysis, or palpitations.   ***She denies any major fatigue, pica, lightheadedness, headaches, or syncope.   ***She has not had any falls since she has been at SNF, but remains wheelchair dependent.  She has 75***% energy and 85***% appetite. She endorses that she is maintaining a stable weight.   ASSESSMENT & PLAN:  1.  Recurrent LLE DVT - Initial episode of left leg superficial venous thrombophlebitis in January 2024, treated with Eliquis through April 2024. - Venous US left leg (08/24/2022): No evidence of acute or chronic DVT or SVT in left leg - Venous US (09/29/2022): Nonocclusive DVT in left femoral vein - Venous US (09/30/2022): Chronic DVT of left  gastrocnemius veins and findings consistent with chronic superficial thrombosis in the left small saphenous vein - Venous US (10/22/2022) showed new acute occlusive DVT seen in left popliteal, posterior tibial, and peroneal veins. - 10/22/2022-10/27/2022 patient admitted to the hospital with new acute left lower extremity DVT.  Placed on back on Eliquis as of 10/26/2022. IVC placement on 10/24/2022 due to concern for concurrent GI bleeding.  IR recommended a full 6-12 weeks of anticoagulation before stopping therapy for any reason - Concern regarding chronic anticoagulation due to fall risk and suspected GI blood loss.  Per risk/benefit discussion with family during hospitalization in May 2024, family opted for full dose anticoagulation with acceptance of associated risks. - US venous left lower leg (02/22/2023): No evidence of LLE DVT.  Previously noted left popliteal, posterior tibial, and peroneal vein thrombus has completely resolved.  *** - D-dimer (02/22/2023) was elevated at 1.61 - Coagulopathy panel (02/22/2023): Lupus anticoagulant, anticardiolipin, beta-2 glycoprotein 1 antibodies are NEGATIVE.  No PT gene mutation or factor V Leiden. - PLAN: Continue full dose Eliquis along with IVC filter for the time being.*** - For the time being, we will focus on optimizing iron deficiency anemia (see below), but will plan on checking repeat venous US and coagulopathy panel in about 3 months (September 2024)*** - Continue follow-up with vascular surgery and IR***   2.  Iron deficiency anemia - Hgb 10.2 on 07/01/2022 (prior to starting Eliquis for initial episode of superficial thrombophlebitis) - During hospitalization in April 2024, patient had heme positive stool, Hgb down to 7.9.  EGD from 10/01/2022 showed gastritis,  but limited evaluation due to large amount of food residue in stomach/duodenum. - During hospitalization in May 2024, Hgb dropped to 6.5, and she was transfused 1 unit PRBC on 10/24/2022.  Family  opted to continue anticoagulation after risk/benefit discussion with hospitalist. - Following with [facility] (Dr. Rhea Belton / Hyacinth Meeker, PA-C) - patient declined further evaluation with EGD/colonoscopy, but this could be reconsidered in the future if able to hold Eliquis and continuing to show signs of GI blood loss. - Patient denies any rectal bleeding or melena. *** No abnormal fatigue or pica.*** - She received IV Feraheme x 2 on 11/21/2022 and 11/28/2022 (unable to receive her third dose due to hospitalization).  Feraheme x 1 given on 01/02/2023. - Labs (02/22/2023): Hgb 11.6/MCV 98.2, WBC 10.8/normal differential, platelets 462, ferritin 175, iron saturation 15% - PLAN: We will hold off on additional IV iron at this time. - Recheck CBC and iron panel with RTC in 3 months - Continue gastroenterology follow-up with [facility]   3.  Other history - PMH: IBS, GERD, hypothyroidism, fibromyalgia, anxiety/depression, arthritis - SOCIAL: Patient is widowed.  She was living at home alone prior to hospitalization in May 2024.  She currently resides at [facility] for acute rehab.  She denies any alcohol, tobacco, or illicit substance use. - FAMILY: Younger brother has had blood clots, but she does not know the details.  She denies any family history of cancer.  She had one daughter, who passed away at age 53 due to brain aneurysm.     PLAN SUMMARY: >> ***      REVIEW OF SYSTEMS: ***  Review of Systems  Constitutional:  Positive for fatigue (waxes and wanes). Negative for appetite change, chills, diaphoresis, fever and unexpected weight change.  HENT:   Negative for lump/mass and nosebleeds.   Eyes:  Negative for eye problems.  Respiratory:  Negative for cough, hemoptysis and shortness of breath.   Cardiovascular:  Negative for chest pain, leg swelling and palpitations.  Gastrointestinal:  Positive for constipation. Negative for abdominal pain, blood in stool, diarrhea, nausea and vomiting.   Genitourinary:  Negative for hematuria.   Skin: Negative.   Neurological:  Positive for headaches. Negative for dizziness and light-headedness.  Hematological:  Does not bruise/bleed easily.     PHYSICAL EXAM:  ECOG PERFORMANCE STATUS: 3 - Symptomatic, >50% confined to bed /wheelchair dependent *** There were no vitals filed for this visit.  Physical Exam Constitutional:      Appearance: Normal appearance. She is obese.     Comments: Presents in wheelchair  HENT:     Head: Normocephalic and atraumatic.     Mouth/Throat:     Mouth: Mucous membranes are moist.  Eyes:     Extraocular Movements: Extraocular movements intact.     Pupils: Pupils are equal, round, and reactive to light.  Cardiovascular:     Rate and Rhythm: Normal rate and regular rhythm.     Pulses: Normal pulses.     Heart sounds: Normal heart sounds.  Pulmonary:     Effort: Pulmonary effort is normal.     Breath sounds: Normal breath sounds.  Abdominal:     General: Bowel sounds are normal.     Palpations: Abdomen is soft.     Tenderness: There is no abdominal tenderness.  Musculoskeletal:        General: No swelling.     Right lower leg: Edema (trace) present.     Left lower leg: Edema (1+) present.  Lymphadenopathy:  Cervical: No cervical adenopathy.  Skin:    General: Skin is warm and dry.  Neurological:     General: No focal deficit present.     Mental Status: She is alert and oriented to person, place, and time.  Psychiatric:        Mood and Affect: Mood normal.        Behavior: Behavior normal.        Cognition and Memory: She exhibits impaired recent memory.     PAST MEDICAL/SURGICAL HISTORY:  Past Medical History:  Diagnosis Date   Anxiety    Arthritis    Colon polyps    Depression    Diverticulosis of colon (without mention of hemorrhage)    Fibromyalgia    GERD (gastroesophageal reflux disease)    Hiatal hernia    History of IBS    History of kidney stones    Hypothyroidism     Iron deficiency anemia due to chronic blood loss 11/15/2022   Past Surgical History:  Procedure Laterality Date   ABDOMINAL HYSTERECTOMY     ANKLE SURGERY     bilateral    APPENDECTOMY  1985   BREAST BIOPSY Left    Benign    CHOLECYSTECTOMY  1985   ESOPHAGEAL MANOMETRY N/A 10/22/2012   Procedure: ESOPHAGEAL MANOMETRY (EM);  Surgeon: Mardella Layman, MD;  Location: [facility];  Service: Endoscopy;  Laterality: N/A;   ESOPHAGOGASTRODUODENOSCOPY N/A 10/01/2022   Procedure: ESOPHAGOGASTRODUODENOSCOPY (EGD);  Surgeon: [name], MD;  Location: [facility];  Service: Gastroenterology;  Laterality: N/A;   EXTRACORPOREAL SHOCK WAVE LITHOTRIPSY Left 11/26/2018   Procedure: EXTRACORPOREAL SHOCK WAVE LITHOTRIPSY (ESWL);  Surgeon: Marcine Matar, MD;  Location: [facility];  Service: Urology;  Laterality: Left;   HEMORRHOID SURGERY     IR IVC FILTER PLMT / S&I /IMG GUID/MOD SED  10/24/2022   RECTOCELE REPAIR     TUBAL LIGATION      SOCIAL HISTORY:  Social History   Socioeconomic History   Marital status: Widowed    Spouse name: Not on file   Number of children: 1   Years of education: Not on file   Highest education level: Not on file  Occupational History   Occupation: retired    Associate Professor: RETIRED  Tobacco Use   Smoking status: Never   Smokeless tobacco: Never  Vaping Use   Vaping status: Never Used  Substance and Sexual Activity   Alcohol use: No    Alcohol/week: 0.0 standard drinks of alcohol   Drug use: No   Sexual activity: Not on file  Other Topics Concern   Not on file  Social History Narrative   Not on file   Social Determinants of Health   Financial Resource Strain: Low Risk  (07/19/2022)   Received from [facility], [facility]   Overall Financial Resource Strain (CARDIA)    Difficulty of Paying Living Expenses: Not hard at all  Food Insecurity: Food Insecurity Present (10/22/2022)   Hunger Vital Sign    Worried About Running Out of Food in the  Last Year: Sometimes true    Ran Out of Food in the Last Year: Sometimes true  Transportation Needs: No Transportation Needs (10/22/2022)   PRAPARE - Administrator, Civil Service (Medical): No    Lack of Transportation (Non-Medical): No  Physical Activity: Unknown (07/19/2022)   Received from [facility], [facility]   Exercise Vital Sign    Days of Exercise per Week: 0 days    Minutes  of Exercise per Session: Not on file  Stress: Stress Concern Present (07/19/2022)   Received from [facility], [facility] Occupational [facility] - Occupational Stress Questionnaire    Feeling of Stress : To some extent  Social Connections: Moderately Integrated (07/19/2022)   Received from [facility], [facility]   Social Network    How would you rate your social network (family, work, friends)?: Adequate participation with social networks  Intimate Partner Violence: Not At Risk (10/22/2022)   Humiliation, Afraid, Rape, and Kick questionnaire    Fear of Current or Ex-Partner: No    Emotionally Abused: No    Physically Abused: No    Sexually Abused: No    FAMILY HISTORY:  Family History  Problem Relation Age of Onset   Colon polyps Brother    Breast cancer Neg Hx     CURRENT MEDICATIONS:  Outpatient Encounter Medications as of 03/13/2023  Medication Sig   acetaminophen (TYLENOL) 325 MG tablet Take 2 tablets (650 mg total) by mouth every 6 (six) hours as needed for mild pain or fever.   apixaban (ELIQUIS) 5 MG TABS tablet Take 1 tablet (5 mg total) by mouth 2 (two) times daily.   escitalopram (LEXAPRO) 20 MG tablet Take 20 mg by mouth daily.   fexofenadine (ALLEGRA) 60 MG tablet Take 1 tablet (60 mg total) by mouth 2 (two) times daily as needed for allergies or rhinitis.   levothyroxine (SYNTHROID, LEVOTHROID) 75 MCG tablet Take 75 mcg by mouth at bedtime.   pantoprazole (PROTONIX) 40 MG tablet Take 1 tablet (40 mg total) by mouth daily.   polyethylene  glycol (MIRALAX / GLYCOLAX) 17 g packet Take 17 g by mouth daily as needed.   potassium chloride (KLOR-CON) 10 MEQ tablet Take 10 mEq by mouth daily.   traZODone (DESYREL) 50 MG tablet Take 1 tablet (50 mg total) by mouth at bedtime as needed for sleep.   Vitamin D, Ergocalciferol, (DRISDOL) 1.25 MG (50000 UNIT) CAPS capsule Take 50,000 Units by mouth every 7 (seven) days.   No facility-administered encounter medications on file as of 03/13/2023.    ALLERGIES:  Allergies  Allergen Reactions   Gabapentin      Dizziness. States made her feel like she was drunk. Unsteady gait and dizziness.         Lidocaine Rash   Metoclopramide Rash   Other Other (See Comments)    DYE+Nitrofurantoin+Brilliant Blue Fcf   Sulfa Antibiotics Rash   Z-Pak [Azithromycin] Rash   Metoclopramide Hcl Other (See Comments)    REACTION: a drawing of all her muscles   Penicillins Other (See Comments)    Unknown    Procaine Hcl Other (See Comments)    Unknown    Macrodantin [Nitrofurantoin Macrocrystal] Rash    LABORATORY DATA:  I have reviewed the labs as listed.  CBC    Component Value Date/Time   WBC 10.8 (H) 02/22/2023 0908   RBC 3.80 (L) 02/22/2023 0908   HGB 11.6 (L) 02/22/2023 0908   HCT 37.3 02/22/2023 0908   PLT 462 (H) 02/22/2023 0908   MCV 98.2 02/22/2023 0908   MCH 30.5 02/22/2023 0908   MCHC 31.1 02/22/2023 0908   RDW 15.4 02/22/2023 0908   LYMPHSABS 2.5 02/22/2023 0908   MONOABS 0.7 02/22/2023 0908   EOSABS 0.3 02/22/2023 0908   BASOSABS 0.1 02/22/2023 0908      Latest Ref Rng & Units 12/06/2022    4:03 AM 12/03/2022    5:18  PM 10/31/2022    8:00 AM  CMP  Glucose 70 - 99 mg/dL 161  096  045   BUN 8 - 23 mg/dL 10  12  18    Creatinine 0.44 - 1.00 mg/dL 4.09  8.11  9.14   Sodium 135 - 145 mmol/L 138  136  134   Potassium 3.5 - 5.1 mmol/L 3.8  3.4  3.7   Chloride 98 - 111 mmol/L 102  103  99   CO2 22 - 32 mmol/L 28  26  24    Calcium 8.9 - 10.3 mg/dL 8.2  7.9  8.5     DIAGNOSTIC  IMAGING:  I have independently reviewed the relevant imaging and discussed with the patient.   WRAP UP:  All questions were answered. The patient knows to call the clinic with any problems, questions or concerns.  Medical decision making: Moderate  Time spent on visit: I spent 20 minutes counseling the patient face to face. The total time spent in the appointment was 30 minutes and more than 50% was on counseling.  Carnella Guadalajara, PA-C  ***

## 2023-03-13 ENCOUNTER — Inpatient Hospital Stay: Payer: Medicare Other | Attending: Hematology | Admitting: Physician Assistant

## 2023-03-13 VITALS — BP 139/72 | HR 87 | Temp 98.0°F | Resp 16

## 2023-03-13 DIAGNOSIS — Z79899 Other long term (current) drug therapy: Secondary | ICD-10-CM | POA: Diagnosis not present

## 2023-03-13 DIAGNOSIS — Z87442 Personal history of urinary calculi: Secondary | ICD-10-CM | POA: Insufficient documentation

## 2023-03-13 DIAGNOSIS — Z993 Dependence on wheelchair: Secondary | ICD-10-CM | POA: Insufficient documentation

## 2023-03-13 DIAGNOSIS — D509 Iron deficiency anemia, unspecified: Secondary | ICD-10-CM | POA: Insufficient documentation

## 2023-03-13 DIAGNOSIS — K589 Irritable bowel syndrome without diarrhea: Secondary | ICD-10-CM | POA: Diagnosis not present

## 2023-03-13 DIAGNOSIS — M797 Fibromyalgia: Secondary | ICD-10-CM | POA: Insufficient documentation

## 2023-03-13 DIAGNOSIS — Z8719 Personal history of other diseases of the digestive system: Secondary | ICD-10-CM | POA: Diagnosis not present

## 2023-03-13 DIAGNOSIS — Z860100 Personal history of colon polyps, unspecified: Secondary | ICD-10-CM | POA: Diagnosis not present

## 2023-03-13 DIAGNOSIS — I824Y2 Acute embolism and thrombosis of unspecified deep veins of left proximal lower extremity: Secondary | ICD-10-CM

## 2023-03-13 DIAGNOSIS — E039 Hypothyroidism, unspecified: Secondary | ICD-10-CM | POA: Diagnosis not present

## 2023-03-13 DIAGNOSIS — I82452 Acute embolism and thrombosis of left peroneal vein: Secondary | ICD-10-CM | POA: Insufficient documentation

## 2023-03-13 DIAGNOSIS — I82442 Acute embolism and thrombosis of left tibial vein: Secondary | ICD-10-CM | POA: Insufficient documentation

## 2023-03-13 DIAGNOSIS — F32A Depression, unspecified: Secondary | ICD-10-CM | POA: Diagnosis not present

## 2023-03-13 DIAGNOSIS — I471 Supraventricular tachycardia, unspecified: Secondary | ICD-10-CM | POA: Insufficient documentation

## 2023-03-13 DIAGNOSIS — Z880 Allergy status to penicillin: Secondary | ICD-10-CM | POA: Diagnosis not present

## 2023-03-13 DIAGNOSIS — E669 Obesity, unspecified: Secondary | ICD-10-CM | POA: Insufficient documentation

## 2023-03-13 DIAGNOSIS — Z8672 Personal history of thrombophlebitis: Secondary | ICD-10-CM | POA: Diagnosis not present

## 2023-03-13 DIAGNOSIS — R5383 Other fatigue: Secondary | ICD-10-CM | POA: Insufficient documentation

## 2023-03-13 DIAGNOSIS — I82562 Chronic embolism and thrombosis of left calf muscular vein: Secondary | ICD-10-CM | POA: Insufficient documentation

## 2023-03-13 DIAGNOSIS — K219 Gastro-esophageal reflux disease without esophagitis: Secondary | ICD-10-CM | POA: Insufficient documentation

## 2023-03-13 DIAGNOSIS — I82432 Acute embolism and thrombosis of left popliteal vein: Secondary | ICD-10-CM | POA: Diagnosis present

## 2023-03-13 DIAGNOSIS — Z882 Allergy status to sulfonamides status: Secondary | ICD-10-CM | POA: Insufficient documentation

## 2023-03-13 DIAGNOSIS — F419 Anxiety disorder, unspecified: Secondary | ICD-10-CM | POA: Diagnosis not present

## 2023-03-13 DIAGNOSIS — Z888 Allergy status to other drugs, medicaments and biological substances status: Secondary | ICD-10-CM | POA: Insufficient documentation

## 2023-03-13 DIAGNOSIS — D500 Iron deficiency anemia secondary to blood loss (chronic): Secondary | ICD-10-CM

## 2023-03-13 DIAGNOSIS — M1990 Unspecified osteoarthritis, unspecified site: Secondary | ICD-10-CM | POA: Diagnosis not present

## 2023-03-13 DIAGNOSIS — R609 Edema, unspecified: Secondary | ICD-10-CM | POA: Diagnosis not present

## 2023-03-13 DIAGNOSIS — Z881 Allergy status to other antibiotic agents status: Secondary | ICD-10-CM | POA: Insufficient documentation

## 2023-03-13 DIAGNOSIS — Z9049 Acquired absence of other specified parts of digestive tract: Secondary | ICD-10-CM | POA: Insufficient documentation

## 2023-03-13 DIAGNOSIS — Z83719 Family history of colon polyps, unspecified: Secondary | ICD-10-CM | POA: Insufficient documentation

## 2023-03-13 DIAGNOSIS — Z7901 Long term (current) use of anticoagulants: Secondary | ICD-10-CM | POA: Diagnosis not present

## 2023-03-13 DIAGNOSIS — K5900 Constipation, unspecified: Secondary | ICD-10-CM | POA: Insufficient documentation

## 2023-03-13 DIAGNOSIS — Z90710 Acquired absence of both cervix and uterus: Secondary | ICD-10-CM | POA: Insufficient documentation

## 2023-03-13 MED ORDER — FERROUS SULFATE 325 (65 FE) MG PO TBEC
325.0000 mg | DELAYED_RELEASE_TABLET | Freq: Every day | ORAL | 3 refills | Status: AC
Start: 2023-03-13 — End: ?

## 2023-03-13 MED ORDER — APIXABAN 2.5 MG PO TABS
2.5000 mg | ORAL_TABLET | Freq: Two times a day (BID) | ORAL | 11 refills | Status: AC
Start: 2023-03-13 — End: ?

## 2023-03-13 NOTE — Patient Instructions (Signed)
[facility] **VISIT SUMMARY & IMPORTANT INSTRUCTIONS **   You were seen today by Dr. Ellin Saba & Rojelio Brenner PA-C for your anemia and blood clot.    BLOOD CLOT: Your blood clot has gone away, but you remain at high risk for having more blood clots in the future. I recommend that you restart your blood thinner (Eliquis), but at a lower dose of 2.5 mg twice daily.  You should NOT stop taking this blood thinner without talking to our office first. We will also reach out to other specialists (vascular specialist and interventional radiologist) regarding your IVC filter.  ANEMIA You do not need any IV iron at this time. You should continue taking iron supplement (ferrous sulfate), but should only take this ONCE daily, and not twice daily.  FOLLOW-UP APPOINTMENT: Labs and office visit in 3 months  ** Thank you for trusting me with your healthcare!  I strive to provide all of my patients with quality care at each visit.  If you receive a survey for this visit, I would be so grateful to you for taking the time to provide feedback.  Thank you in advance!  ~ Matthe Sloane                   Dr. Doreatha Massed   &   Rojelio Brenner, PA-C   - - - - - - - - - - - - - - - - - -    Thank you for choosing [facility] at [facility] to provide your oncology and hematology care.  To afford each patient quality time with our provider, please arrive at least 15 minutes before your scheduled appointment time.   If you have a lab appointment with the Cancer Center please come in thru the Main Entrance and check in at the main information desk.  You need to re-schedule your appointment should you arrive 10 or more minutes late.  We strive to give you quality time with our providers, and arriving late affects you and other patients whose appointments are after yours.  Also, if you no show three or more times for appointments you may be dismissed  from the clinic at the providers discretion.     Again, thank you for choosing [facility].  Our hope is that these requests will decrease the amount of time that you wait before being seen by our physicians.       _____________________________________________________________  Should you have questions after your visit to [facility], please contact our office at [phone] and follow the prompts.  Our office hours are 8:00 a.m. and 4:30 p.m. Monday - Friday.  Please note that voicemails left after 4:00 p.m. may not be returned until the following business day.  We are closed weekends and major holidays.  You do have access to a nurse 24-7, just call the main number to the clinic [phone] and do not press any options, hold on the line and a nurse will answer the phone.    For prescription refill requests, have your pharmacy contact our office and allow 72 hours.

## 2023-03-14 NOTE — Progress Notes (Signed)
Referring Physician(s): Rojelio Brenner, PA-C  Chief Complaint: The patient is seen in virtual follow up today s/p IVC filter placement 10/24/22  History of present illness: Stefanie [name] is an 82 y.o. 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 She was recently hospitalized 4/25-4/27 for leg swelling and was found to have a non-occluding DVT in her left femoral vein. Her fecal occult test was positive. She was treated with IV heparin and protonix. 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 Per Vascular recommendations, patient was ok to be discharged without IVC filter and without anticoagulation.     She presented to the [facility] ED 10/22/22 after a mechanical fall at home. Her hemoglobin level was stable compared to her hospital visit in April. 10/22/22 venous duplex was positive for "new acute occlusive deep vein thrombus in the left popliteal, posterior tibial and peroneal veins." She was started on IV heparin. Due to concerns for the patient's bleeding risk on anticoagulation IR was consulted for an IVC filter placement and this was performed by me 10/24/22.    She was discharged home 10/27/22 on Eliquis and she followed up with Hematology and GI as an outpatient. She declined further GI evaluation with EGD/Colonoscopy. She has remained on low dose Eliquis (2.5 mg BID). She recently had repeat imaging done of the left lower extremity.   She presents today via virtual tele-phone visit to discuss her recent imaging and next steps regarding the IVC filter.   Past Medical History:  Diagnosis Date   Anxiety    Arthritis    Colon polyps    Depression    Diverticulosis of colon (without mention of hemorrhage)    Fibromyalgia    GERD (gastroesophageal reflux  disease)    Hiatal hernia    History of IBS    History of kidney stones    Hypothyroidism    Iron deficiency anemia due to chronic blood loss 11/15/2022    Past Surgical History:  Procedure Laterality Date   ABDOMINAL HYSTERECTOMY     ANKLE SURGERY     bilateral    APPENDECTOMY  1985   BREAST BIOPSY Left    Benign    CHOLECYSTECTOMY  1985   ESOPHAGEAL MANOMETRY N/A 10/22/2012   Procedure: ESOPHAGEAL MANOMETRY (EM);  Surgeon: Mardella Layman, MD;  Location: [facility];  Service: Endoscopy;  Laterality: N/A;   ESOPHAGOGASTRODUODENOSCOPY N/A 10/01/2022   Procedure: ESOPHAGOGASTRODUODENOSCOPY (EGD);  Surgeon: [name], MD;  Location[facility];  Service: Gastroenterology;  Laterality: N/A;   EXTRACORPOREAL SHOCK WAVE LITHOTRIPSY Left 11/26/2018   Procedure: EXTRACORPOREAL SHOCK WAVE LITHOTRIPSY (ESWL);  Surgeon: Marcine Matar, MD;  Location: [facility];  Service: Urology;  Laterality: Left;   HEMORRHOID SURGERY     IR IVC FILTER PLMT / S&I /IMG GUID/MOD SED  10/24/2022   RECTOCELE REPAIR     TUBAL LIGATION      Allergies: Gabapentin, Lidocaine, Metoclopramide, Other, Sulfa antibiotics, Z-pak [azithromycin], Metoclopramide hcl, Penicillins, Procaine hcl, and Macrodantin [nitrofurantoin macrocrystal]  Medications: Prior to Admission medications   Medication Sig Start Date End Date Taking? Authorizing Provider  acetaminophen (TYLENOL) 325 MG tablet Take 2 tablets (650 mg total) by mouth every 6 (six) hours as needed for mild pain or fever. 12/06/22   Shon Hale, MD  apixaban (ELIQUIS) 2.5 MG TABS  tablet Take 1 tablet (2.5 mg total) by mouth 2 (two) times daily. 03/13/23   Carnella Guadalajara, PA-C  escitalopram (LEXAPRO) 20 MG tablet Take 20 mg by mouth daily.    [provider]  ferrous sulfate 325 (65 FE) MG EC tablet Take 1 tablet (325 mg total) by mouth daily with breakfast. 03/13/23   Buford Dresser, Rebekah M, PA-C  fexofenadine (ALLEGRA) 60 MG tablet Take  1 tablet (60 mg total) by mouth 2 (two) times daily as needed for allergies or rhinitis. 10/27/22   Johnson, Clanford L, MD  levothyroxine (SYNTHROID, LEVOTHROID) 75 MCG tablet Take 75 mcg by mouth at bedtime.    [provider]  pantoprazole (PROTONIX) 40 MG tablet Take 1 tablet (40 mg total) by mouth daily. 12/06/22 12/06/23  Shon Hale, MD  polyethylene glycol (MIRALAX / GLYCOLAX) 17 g packet Take 17 g by mouth daily as needed. 12/06/22   Shon Hale, MD  potassium chloride (KLOR-CON) 10 MEQ tablet Take 10 mEq by mouth daily. 08/17/22   [provider]  traZODone (DESYREL) 50 MG tablet Take 1 tablet (50 mg total) by mouth at bedtime as needed for sleep. 12/06/22   Shon Hale, MD  Vitamin D, Ergocalciferol, (DRISDOL) 1.25 MG (50000 UNIT) CAPS capsule Take 50,000 Units by mouth every 7 (seven) days. 10/05/22   [provider]     Family History  Problem Relation Age of Onset   Colon polyps Brother    Breast cancer Neg Hx     Social History   Socioeconomic History   Marital status: Widowed    Spouse name: Not on file   Number of children: 1   Years of education: Not on file   Highest education level: Not on file  Occupational History   Occupation: retired    Associate Professor: RETIRED  Tobacco Use   Smoking status: Never   Smokeless tobacco: Never  Vaping Use   Vaping status: Never Used  Substance and Sexual Activity   Alcohol use: No    Alcohol/week: 0.0 standard drinks of alcohol   Drug use: No   Sexual activity: Not on file  Other Topics Concern   Not on file  Social History Narrative   Not on file   Social Determinants of Health   Financial Resource Strain: Low Risk  (07/19/2022)   Received from [facility], [facility]   Overall Financial Resource Strain (CARDIA)    Difficulty of Paying Living Expenses: Not hard at all  Food Insecurity: Food Insecurity Present (10/22/2022)   Hunger Vital Sign    Worried About Running Out of Food in the  Last Year: Sometimes true    Ran Out of Food in the Last Year: Sometimes true  Transportation Needs: No Transportation Needs (10/22/2022)   PRAPARE - Administrator, Civil Service (Medical): No    Lack of Transportation (Non-Medical): No  Physical Activity: Unknown (07/19/2022)   Received from [facility], [facility]   Exercise Vital Sign    Days of Exercise per Week: 0 days    Minutes of Exercise per Session: Not on file  Stress: Stress Concern Present (07/19/2022)   Received from [facility], [facility]ational Health - Occupational Stress Questionnaire    Feeling of Stress : To some extent  Social Connections: Moderately Integrated (07/19/2022)   Received from [facility], [facility]   Social Network    How would you rate your social network (family, work, friends)?:  Adequate participation with social networks     Vital Signs: There were no vitals taken for this visit.  No physical exam was performed in lieu of virtual telephone visit.   Imaging: No results found.  Labs:  CBC: Recent Labs    12/04/22 0433 12/06/22 0403 12/28/22 0827 02/22/23 0908  WBC 7.2 8.2 7.0 10.8*  HGB 8.1* 9.1* 12.1 11.6*  HCT 28.2* 30.7* 39.6 37.3  PLT 293 346 256 462*    COAGS: No results for input(s): "INR", "APTT" in the last 8760 hours.  BMP: Recent Labs    10/26/22 0428 10/31/22 0800 12/03/22 1718 12/06/22 0403  NA 136 134* 136 138  K 3.8 3.7 3.4* 3.8  CL 100 99 103 102  CO2 28 24 26 28   GLUCOSE 107* 108* 117* 109*  BUN 13 18 12 10   CALCIUM 8.4* 8.5* 7.9* 8.2*  CREATININE 0.65 0.60 0.60 0.66  GFRNONAA >60 >60 >60 >60    LIVER FUNCTION TESTS: Recent Labs    07/01/22 1131 09/30/22 0900  BILITOT 0.5 0.6  AST 18 14*  ALT 12 10  ALKPHOS 88 105  PROT 6.8 6.2*  ALBUMIN 3.4* 3.1*    Assessment and Plan:  82 year old female with a history of left lower extremity DVT and GI bleed. She underwent IVC filter placement  10/24/22. She has been maintained on low dose Eliquis and recent ultrasound imaging of the left lower extremity was negative for DVT.   Electronically Signed: Mickie Kay 03/14/2023, 3:38 PM   I spent a total of 25 Minutes in virtual telephone clinical consultation, greater than 50% of which was counseling/coordinating care for IVC filter placement.

## 2023-03-15 ENCOUNTER — Inpatient Hospital Stay
Admission: RE | Admit: 2023-03-15 | Discharge: 2023-03-15 | Disposition: A | Discharge status: Home / Self Care | Payer: Medicare Other | Source: Ambulatory Visit | Attending: Student

## 2023-03-15 DIAGNOSIS — I82492 Acute embolism and thrombosis of other specified deep vein of left lower extremity: Secondary | ICD-10-CM

## 2023-03-15 HISTORY — PX: IR RADIOLOGIST EVAL & MGMT: IMG5224

## 2023-05-25 ENCOUNTER — Encounter: Payer: Self-pay | Admitting: Hematology

## 2023-05-28 ENCOUNTER — Encounter: Payer: Self-pay | Admitting: Hematology

## 2023-06-15 ENCOUNTER — Ambulatory Visit: Payer: Medicare Other | Admitting: Physician Assistant

## 2023-06-27 ENCOUNTER — Inpatient Hospital Stay: Payer: No Typology Code available for payment source | Admitting: Physician Assistant

## 2023-06-27 ENCOUNTER — Encounter: Payer: Self-pay | Admitting: Hematology

## 2023-06-27 ENCOUNTER — Inpatient Hospital Stay: Payer: No Typology Code available for payment source

## 2023-06-27 ENCOUNTER — Other Ambulatory Visit: Payer: Self-pay

## 2023-06-29 ENCOUNTER — Ambulatory Visit: Payer: Medicare Other | Admitting: Physician Assistant

## 2023-07-27 ENCOUNTER — Ambulatory Visit: Payer: Medicare Other | Admitting: Gastroenterology

## 2023-07-31 NOTE — Progress Notes (Unsigned)
 [facility] [address], [place] [address]   CLINIC:  Medical Oncology/Hematology  PCP:  [name], MD [address] [place] [address] [phone]   REASON FOR VISIT:  Follow-up for iron deficiency anemia and left lower extremity DVT  CURRENT THERAPY: Eliquis + IV Feraheme   INTERVAL HISTORY:   [name] 83 y.o. female returns for routine follow-up of her iron deficiency anemia and left lower extremity DVT.  She was last seen by Rojelio Brenner PA-C on 03/13/2023.  She is accompanied today by a staff member from SNF.  At today's visit, she reports feeling fairly well, although she is a difficult historian due to her underlying dementia.  She reports some headaches and fatigue.  Denies any pica, lightheadedness, syncope, chest pain, difficulty breathing. Patient continues to deny any rectal bleeding, melena, or other signs of major bleeding.  Her left leg edema has improved, but she has some lingering left leg pain.  She has not had any falls since she has been at [facility]. She continues to take iron tablet once daily.  She is taking Eliquis 2.5 mg twice daily.  She has 50% energy and 75% appetite. She endorses that she is maintaining a stable weight.  ASSESSMENT & PLAN:  1.  Recurrent LLE DVT - Initial episode of left leg superficial venous thrombophlebitis in January 2024, treated with Eliquis through April 2024. - Venous US left leg (08/24/2022): No evidence of acute or chronic DVT or SVT in left leg - Venous US (09/29/2022): Nonocclusive DVT in left femoral vein - Venous US (09/30/2022): Chronic DVT of left gastrocnemius veins and findings consistent with chronic superficial thrombosis in the left small saphenous vein - Venous US (10/22/2022) showed new acute occlusive DVT seen in left popliteal, posterior tibial, and peroneal veins. - 10/22/2022-10/27/2022 patient admitted to the hospital with new acute left lower extremity DVT.   Placed on back on Eliquis as of 10/26/2022. IVC placement on 10/24/2022 due to concern for concurrent GI bleeding.  IR recommended a full 6-12 weeks of anticoagulation before stopping therapy for any reason - Concern regarding chronic anticoagulation due to fall risk and suspected GI blood loss.  Per risk/benefit discussion with family during hospitalization in May 2024, family opted for full dose anticoagulation with acceptance of associated risks. - US venous left lower leg (02/22/2023): No evidence of LLE DVT.  Previously noted left popliteal, posterior tibial, and peroneal vein thrombus has completely resolved.   - D-dimer (02/22/2023) was elevated at 1.61 - Coagulopathy panel (02/22/2023): Lupus anticoagulant, anticardiolipin, beta-2 glycoprotein 1 antibodies are NEGATIVE.  No PT gene mutation or factor V Leiden. - Patient's brother also had recurrent DVTs - SVT/DVT weakly provoked due to decreased mobility (ambulating with walker) and boot on left leg due to recent Achilles tendon injury - She continues to have ongoing risk factors including limited mobility - She remains at risk for falls, but denies any fall within the past 6 months.  She denies any obvious rectal bleeding or melena.   - PLAN: Due to ongoing risk factors for recurrent DVT, recommend low-dose Eliquis 2.5 mg twice daily. - Reached out to vascular surgery and IR after visit on 03/13/2023.  Both IR and vascular surgery agreed with IVC retrieval, but were unable to contact patient's family to obtain consent.  Information has been sent to SNF with request that they help facilitate family getting in contact with IR. ** NOTE:  discussed with Dr. Ellin Saba on  03/13/2023, who agrees with the above.   2.  Iron deficiency anemia - Hgb 10.2 on 07/01/2022 (prior to starting Eliquis for initial episode of superficial thrombophlebitis) - During hospitalization in April 2024, patient had heme positive stool, Hgb down to 7.9.  EGD from 10/01/2022  showed gastritis, but limited evaluation due to large amount of food residue in stomach/duodenum. - During hospitalization in May 2024, Hgb dropped to 6.5, and she was transfused 1 unit PRBC on 10/24/2022.  Family opted to continue anticoagulation after risk/benefit discussion with hospitalist. - Following with [facility] (Dr. Rhea Belton / Hyacinth Meeker, PA-C) - patient declined further evaluation with EGD/colonoscopy, but this could be reconsidered in the future if able to hold Eliquis and continuing to show signs of GI blood loss. - Patient denies any rectal bleeding or melena.  Reports mild fatigue and headaches. - She received IV Feraheme x 2 on 11/21/2022 and 11/28/2022 (unable to receive her third dose due to hospitalization).  Feraheme x 1 given on 01/02/2023. - Labs (08/01/2023): Hgb 11.0/MCV 95.2, WBC 7.8/normal differential, platelets normal, ferritin 14, iron saturation 8% - PLAN: Recommend IV Feraheme x 2 - Continue daily iron supplement - Recheck CBC and iron panel with RTC in 4 months - Continue gastroenterology follow-up with [facility]   3.  Other history - PMH: IBS, GERD, hypothyroidism, fibromyalgia, anxiety/depression, arthritis - SOCIAL: Patient is widowed.  She was living at home alone prior to hospitalization in May 2024.  She currently resides at [facility] for acute rehab.  She denies any alcohol, tobacco, or illicit substance use. - FAMILY: Younger brother has had blood clots, but she does not know the details.  She denies any family history of cancer.  She had one daughter, who passed away at age 49 due to brain aneurysm.     PLAN SUMMARY:  >> IV Feraheme x 2 >> Labs in 4 months = CBC/D, ferritin, iron/TIBC >> OFFICE visit in 4 months (after labs)      REVIEW OF SYSTEMS:   Review of Systems  Constitutional:  Positive for fatigue (waxes and wanes). Negative for appetite change, chills, diaphoresis, fever and unexpected weight change.  HENT:   Negative for lump/mass  and nosebleeds.   Eyes:  Negative for eye problems.  Respiratory:  Positive for cough. Negative for hemoptysis and shortness of breath.   Cardiovascular:  Negative for chest pain, leg swelling and palpitations.  Gastrointestinal:  Positive for constipation. Negative for abdominal pain, blood in stool, diarrhea, nausea and vomiting.  Genitourinary:  Negative for hematuria.   Skin:  Positive for itching.  Neurological:  Positive for dizziness and headaches. Negative for light-headedness.  Hematological:  Does not bruise/bleed easily.  Psychiatric/Behavioral:  Positive for depression.      PHYSICAL EXAM:  ECOG PERFORMANCE STATUS: 3 - Symptomatic, >50% confined to bed /wheelchair dependent  Vitals:   08/01/23 0838  BP: 130/66  Pulse: 72  Resp: 18  Temp: (!) 96.6 F (35.9 C)  SpO2: 96%    Physical Exam Constitutional:      Appearance: Normal appearance.     Comments: Presents in wheelchair  Cardiovascular:     Rate and Rhythm: Normal rate and regular rhythm.     Pulses: Normal pulses.     Heart sounds: Normal heart sounds.  Pulmonary:     Effort: Pulmonary effort is normal.     Breath sounds: Normal breath sounds.  Musculoskeletal:        General: No swelling.  Right lower leg: No edema.     Left lower leg: No edema.  Skin:    General: Skin is warm and dry.  Neurological:     General: No focal deficit present.     Mental Status: She is alert and oriented to person, place, and time.  Psychiatric:        Mood and Affect: Mood normal.        Behavior: Behavior normal.        Cognition and Memory: She exhibits impaired recent memory.    PAST MEDICAL/SURGICAL HISTORY:  Past Medical History:  Diagnosis Date   Anxiety    Arthritis    Colon polyps    Depression    Diverticulosis of colon (without mention of hemorrhage)    Fibromyalgia    GERD (gastroesophageal reflux disease)    Hiatal hernia    History of IBS    History of kidney stones    Hypothyroidism     Iron deficiency anemia due to chronic blood loss 11/15/2022   Past Surgical History:  Procedure Laterality Date   ABDOMINAL HYSTERECTOMY     ANKLE SURGERY     bilateral    APPENDECTOMY  1985   BREAST BIOPSY Left    Benign    CHOLECYSTECTOMY  1985   ESOPHAGEAL MANOMETRY N/A 10/22/2012   Procedure: ESOPHAGEAL MANOMETRY (EM);  Surgeon: Mardella Layman, MD;  Location: [facility];  Service: Endoscopy;  Laterality: N/A;   ESOPHAGOGASTRODUODENOSCOPY N/A 10/01/2022   Procedure: ESOPHAGOGASTRODUODENOSCOPY (EGD);  Surgeon: [name], MD;  Location: [facility] ENDOSCOPY;  Service: Gastroenterology;  Laterality: N/A;   EXTRACORPOREAL SHOCK WAVE LITHOTRIPSY Left 11/26/2018   Procedure: EXTRACORPOREAL SHOCK WAVE LITHOTRIPSY (ESWL);  Surgeon: Marcine Matar, MD;  Location: [facility];  Service: Urology;  Laterality: Left;   HEMORRHOID SURGERY     IR IVC FILTER PLMT / S&I /IMG GUID/MOD SED  10/24/2022   IR RADIOLOGIST EVAL & MGMT  03/15/2023   RECTOCELE REPAIR     TUBAL LIGATION      SOCIAL HISTORY:  Social History   Socioeconomic History   Marital status: Widowed    Spouse name: Not on file   Number of children: 1   Years of education: Not on file   Highest education level: Not on file  Occupational History   Occupation: retired    Associate Professor: RETIRED  Tobacco Use   Smoking status: Never   Smokeless tobacco: Never  Vaping Use   Vaping status: Never Used  Substance and Sexual Activity   Alcohol use: No    Alcohol/week: 0.0 standard drinks of alcohol   Drug use: No   Sexual activity: Not on file  Other Topics Concern   Not on file  Social History Narrative   Not on file   Social Drivers of Health   Financial Resource Strain: Low Risk  (07/19/2022)   Received from [facility], [facility]   Overall Financial Resource Strain (CARDIA)    Difficulty of Paying Living Expenses: Not hard at all  Food Insecurity: Food Insecurity Present (10/22/2022)   Hunger Vital Sign    Worried  About Running Out of Food in the Last Year: Sometimes true    Ran Out of Food in the Last Year: Sometimes true  Transportation Needs: No Transportation Needs (10/22/2022)   PRAPARE - Administrator, Civil Service (Medical): No    Lack of Transportation (Non-Medical): No  Physical Activity: Unknown (07/19/2022)   Received from [facility], [facility]  Exercise Vital Sign    Days of Exercise per Week: 0 days    Minutes of Exercise per Session: Not on file  Stress: Stress Concern Present (07/19/2022)   Received from [facility], [facility] of Occupational Health - Occupational Stress Questionnaire    Feeling of Stress : To some extent  Social Connections: Moderately Integrated (07/19/2022)   Received from [facility], [facility]   Social Network    How would you rate your social network (family, work, friends)?: Adequate participation with social networks  Intimate Partner Violence: Not At Risk (10/22/2022)   Humiliation, Afraid, Rape, and Kick questionnaire    Fear of Current or Ex-Partner: No    Emotionally Abused: No    Physically Abused: No    Sexually Abused: No    FAMILY HISTORY:  Family History  Problem Relation Age of Onset   Colon polyps Brother    Breast cancer Neg Hx     CURRENT MEDICATIONS:  Outpatient Encounter Medications as of 08/01/2023  Medication Sig   acetaminophen (TYLENOL) 325 MG tablet Take 2 tablets (650 mg total) by mouth every 6 (six) hours as needed for mild pain or fever.   apixaban (ELIQUIS) 2.5 MG TABS tablet Take 1 tablet (2.5 mg total) by mouth 2 (two) times daily.   escitalopram (LEXAPRO) 20 MG tablet Take 20 mg by mouth daily.   ferrous sulfate 325 (65 FE) MG EC tablet Take 1 tablet (325 mg total) by mouth daily with breakfast.   fexofenadine (ALLEGRA) 60 MG tablet Take 1 tablet (60 mg total) by mouth 2 (two) times daily as needed for allergies or rhinitis.   lamoTRIgine (LAMICTAL) 100 MG tablet Take 100  mg by mouth daily.   levothyroxine (SYNTHROID, LEVOTHROID) 75 MCG tablet Take 75 mcg by mouth at bedtime.   pantoprazole (PROTONIX) 40 MG tablet Take 1 tablet (40 mg total) by mouth daily.   polyethylene glycol (MIRALAX / GLYCOLAX) 17 g packet Take 17 g by mouth daily as needed.   potassium chloride (KLOR-CON) 10 MEQ tablet Take 10 mEq by mouth daily.   traZODone (DESYREL) 50 MG tablet Take 1 tablet (50 mg total) by mouth at bedtime as needed for sleep.   Vitamin D, Ergocalciferol, (DRISDOL) 1.25 MG (50000 UNIT) CAPS capsule Take 50,000 Units by mouth every 7 (seven) days.   No facility-administered encounter medications on file as of 08/01/2023.    ALLERGIES:  Allergies  Allergen Reactions   Gabapentin      Dizziness. States made her feel like she was drunk. Unsteady gait and dizziness.         Lidocaine Rash   Metoclopramide Rash   Other Other (See Comments)    DYE+Nitrofurantoin+Brilliant Blue Fcf   Sulfa Antibiotics Rash   Z-Pak [Azithromycin] Rash   Metoclopramide Hcl Other (See Comments)    REACTION: a drawing of all her muscles   Penicillins Other (See Comments)    Unknown    Procaine Hcl Other (See Comments)    Unknown    Macrodantin [Nitrofurantoin Macrocrystal] Rash    LABORATORY DATA:  I have reviewed the labs as listed.  CBC    Component Value Date/Time   WBC 7.8 08/01/2023 0804   RBC 3.73 (L) 08/01/2023 0804   HGB 11.0 (L) 08/01/2023 0804   HCT 35.5 (L) 08/01/2023 0804   PLT 393 08/01/2023 0804   MCV 95.2 08/01/2023 0804   MCH 29.5 08/01/2023 0804   MCHC 31.0 08/01/2023 0804  RDW 14.5 08/01/2023 0804   LYMPHSABS 2.0 08/01/2023 0804   MONOABS 0.8 08/01/2023 0804   EOSABS 0.3 08/01/2023 0804   BASOSABS 0.1 08/01/2023 0804      Latest Ref Rng & Units 12/06/2022    4:03 AM 12/03/2022    5:18 PM 10/31/2022    8:00 AM  CMP  Glucose 70 - 99 mg/dL 161  096  045   BUN 8 - 23 mg/dL 10  12  18    Creatinine 0.44 - 1.00 mg/dL 4.09  8.11  9.14   Sodium 135 -  145 mmol/L 138  136  134   Potassium 3.5 - 5.1 mmol/L 3.8  3.4  3.7   Chloride 98 - 111 mmol/L 102  103  99   CO2 22 - 32 mmol/L 28  26  24    Calcium 8.9 - 10.3 mg/dL 8.2  7.9  8.5     DIAGNOSTIC IMAGING:  I have independently reviewed the relevant imaging and discussed with the patient.   WRAP UP:  All questions were answered. The patient knows to call the clinic with any problems, questions or concerns.  Medical decision making: Moderate  Time spent on visit: I spent 20 minutes counseling the patient face to face. The total time spent in the appointment was 30 minutes and more than 50% was on counseling.  Carnella Guadalajara, PA-C  08/01/23 9:34 AM

## 2023-08-01 ENCOUNTER — Inpatient Hospital Stay: Payer: No Typology Code available for payment source | Attending: Hematology | Admitting: Physician Assistant

## 2023-08-01 ENCOUNTER — Inpatient Hospital Stay: Payer: No Typology Code available for payment source

## 2023-08-01 DIAGNOSIS — Z888 Allergy status to other drugs, medicaments and biological substances status: Secondary | ICD-10-CM | POA: Insufficient documentation

## 2023-08-01 DIAGNOSIS — Z87442 Personal history of urinary calculi: Secondary | ICD-10-CM | POA: Insufficient documentation

## 2023-08-01 DIAGNOSIS — Z79899 Other long term (current) drug therapy: Secondary | ICD-10-CM | POA: Diagnosis not present

## 2023-08-01 DIAGNOSIS — Z7901 Long term (current) use of anticoagulants: Secondary | ICD-10-CM | POA: Insufficient documentation

## 2023-08-01 DIAGNOSIS — Z90710 Acquired absence of both cervix and uterus: Secondary | ICD-10-CM | POA: Insufficient documentation

## 2023-08-01 DIAGNOSIS — I82542 Chronic embolism and thrombosis of left tibial vein: Secondary | ICD-10-CM | POA: Insufficient documentation

## 2023-08-01 DIAGNOSIS — Z881 Allergy status to other antibiotic agents status: Secondary | ICD-10-CM | POA: Diagnosis not present

## 2023-08-01 DIAGNOSIS — I82532 Chronic embolism and thrombosis of left popliteal vein: Secondary | ICD-10-CM | POA: Diagnosis not present

## 2023-08-01 DIAGNOSIS — K219 Gastro-esophageal reflux disease without esophagitis: Secondary | ICD-10-CM | POA: Diagnosis not present

## 2023-08-01 DIAGNOSIS — F32A Depression, unspecified: Secondary | ICD-10-CM | POA: Diagnosis not present

## 2023-08-01 DIAGNOSIS — Z860100 Personal history of colon polyps, unspecified: Secondary | ICD-10-CM | POA: Insufficient documentation

## 2023-08-01 DIAGNOSIS — I82552 Chronic embolism and thrombosis of left peroneal vein: Secondary | ICD-10-CM | POA: Diagnosis not present

## 2023-08-01 DIAGNOSIS — K5900 Constipation, unspecified: Secondary | ICD-10-CM | POA: Diagnosis not present

## 2023-08-01 DIAGNOSIS — E039 Hypothyroidism, unspecified: Secondary | ICD-10-CM | POA: Diagnosis not present

## 2023-08-01 DIAGNOSIS — Z8719 Personal history of other diseases of the digestive system: Secondary | ICD-10-CM | POA: Diagnosis not present

## 2023-08-01 DIAGNOSIS — R600 Localized edema: Secondary | ICD-10-CM | POA: Insufficient documentation

## 2023-08-01 DIAGNOSIS — Z8672 Personal history of thrombophlebitis: Secondary | ICD-10-CM | POA: Diagnosis not present

## 2023-08-01 DIAGNOSIS — F0394 Unspecified dementia, unspecified severity, with anxiety: Secondary | ICD-10-CM | POA: Insufficient documentation

## 2023-08-01 DIAGNOSIS — R5383 Other fatigue: Secondary | ICD-10-CM | POA: Diagnosis not present

## 2023-08-01 DIAGNOSIS — R519 Headache, unspecified: Secondary | ICD-10-CM | POA: Diagnosis not present

## 2023-08-01 DIAGNOSIS — D509 Iron deficiency anemia, unspecified: Secondary | ICD-10-CM | POA: Diagnosis present

## 2023-08-01 DIAGNOSIS — D500 Iron deficiency anemia secondary to blood loss (chronic): Secondary | ICD-10-CM

## 2023-08-01 DIAGNOSIS — Z882 Allergy status to sulfonamides status: Secondary | ICD-10-CM | POA: Insufficient documentation

## 2023-08-01 DIAGNOSIS — Z880 Allergy status to penicillin: Secondary | ICD-10-CM | POA: Insufficient documentation

## 2023-08-01 DIAGNOSIS — F0393 Unspecified dementia, unspecified severity, with mood disturbance: Secondary | ICD-10-CM | POA: Diagnosis not present

## 2023-08-01 DIAGNOSIS — Z83719 Family history of colon polyps, unspecified: Secondary | ICD-10-CM | POA: Insufficient documentation

## 2023-08-01 DIAGNOSIS — Z9049 Acquired absence of other specified parts of digestive tract: Secondary | ICD-10-CM | POA: Insufficient documentation

## 2023-08-01 DIAGNOSIS — R059 Cough, unspecified: Secondary | ICD-10-CM | POA: Diagnosis not present

## 2023-08-01 DIAGNOSIS — R42 Dizziness and giddiness: Secondary | ICD-10-CM | POA: Diagnosis not present

## 2023-08-01 LAB — CBC WITH DIFFERENTIAL/PLATELET
Abs Immature Granulocytes: 0.03 10*3/uL (ref 0.00–0.07)
Basophils Absolute: 0.1 10*3/uL (ref 0.0–0.1)
Basophils Relative: 1 %
Eosinophils Absolute: 0.3 10*3/uL (ref 0.0–0.5)
Eosinophils Relative: 4 %
HCT: 35.5 % — ABNORMAL LOW (ref 36.0–46.0)
Hemoglobin: 11 g/dL — ABNORMAL LOW (ref 12.0–15.0)
Immature Granulocytes: 0 %
Lymphocytes Relative: 26 %
Lymphs Abs: 2 10*3/uL (ref 0.7–4.0)
MCH: 29.5 pg (ref 26.0–34.0)
MCHC: 31 g/dL (ref 30.0–36.0)
MCV: 95.2 fL (ref 80.0–100.0)
Monocytes Absolute: 0.8 10*3/uL (ref 0.1–1.0)
Monocytes Relative: 10 %
Neutro Abs: 4.5 10*3/uL (ref 1.7–7.7)
Neutrophils Relative %: 59 %
Platelets: 393 10*3/uL (ref 150–400)
RBC: 3.73 MIL/uL — ABNORMAL LOW (ref 3.87–5.11)
RDW: 14.5 % (ref 11.5–15.5)
WBC: 7.8 10*3/uL (ref 4.0–10.5)
nRBC: 0 % (ref 0.0–0.2)

## 2023-08-01 LAB — IRON AND TIBC
Iron: 32 ug/dL (ref 28–170)
Saturation Ratios: 8 % — ABNORMAL LOW (ref 10.4–31.8)
TIBC: 404 ug/dL (ref 250–450)
UIBC: 372 ug/dL

## 2023-08-01 LAB — FERRITIN: Ferritin: 14 ng/mL (ref 11–307)

## 2023-08-01 LAB — SAMPLE TO BLOOD BANK

## 2023-08-01 NOTE — Patient Instructions (Addendum)
 [facility] at [facility] **VISIT SUMMARY & IMPORTANT INSTRUCTIONS **   You were seen today by Rojelio Brenner PA-C for your anemia and blood clot.    BLOOD CLOT: Your blood clot has gone away, but you remain at high risk for having more blood clots in the future. Continue Eliquis (apixaban) 2.5 mg twice daily to reduce risk of future blood clots. You had a visit with Dr. Elby Showers (interventional radiologist) on 03/15/2023.  He recommended that your IVC filter be removed.  However, he was unable to contact your family member to obtain permission.  Please have your family contact the office of Dr. Elby Showers ([facility]) at [phone].   ANEMIA Your blood and iron levels are low. We will schedule you for 2 doses of IV iron. Continue taking iron supplement daily.  FOLLOW-UP APPOINTMENT: 4 months  ** Thank you for trusting me with your healthcare!  I strive to provide all of my patients with quality care at each visit.  If you receive a survey for this visit, I would be so grateful to you for taking the time to provide feedback.  Thank you in advance!  ~ Yazir Koerber                   Dr. Doreatha Massed   &   Rojelio Brenner, PA-C   - - - - - - - - - - - - - - - - - -    Thank you for choosing [facility] at [facility] to provide your oncology and hematology care.  To afford each patient quality time with our provider, please arrive at least 15 minutes before your scheduled appointment time.   If you have a lab appointment with the Cancer Center please come in thru the Main Entrance and check in at the main information desk.  You need to re-schedule your appointment should you arrive 10 or more minutes late.  We strive to give you quality time with our providers, and arriving late affects you and other patients whose appointments are after yours.  Also, if you no show three or more times for appointments you  may be dismissed from the clinic at the providers discretion.     Again, thank you for choosing [facility]  Our hope is that these requests will decrease the amount of time that you wait before being seen by our physicians.       _____________________________________________________________  Should you have questions after your visit to [facility], please contact our office at [phone] and follow the prompts.  Our office hours are 8:00 a.m. and 4:30 p.m. Monday - Friday.  Please note that voicemails left after 4:00 p.m. may not be returned until the following business day.  We are closed weekends and major holidays.  You do have access to a nurse 24-7, just call the main number to the clinic [phone] and do not press any options, hold on the line and a nurse will answer the phone.    For prescription refill requests, have your pharmacy contact our office and allow 72 hours.

## 2023-08-04 ENCOUNTER — Inpatient Hospital Stay: Payer: No Typology Code available for payment source

## 2023-08-09 ENCOUNTER — Other Ambulatory Visit: Payer: Self-pay | Admitting: Interventional Radiology

## 2023-08-09 DIAGNOSIS — Z95828 Presence of other vascular implants and grafts: Secondary | ICD-10-CM

## 2023-08-11 ENCOUNTER — Inpatient Hospital Stay: Payer: No Typology Code available for payment source | Attending: Hematology

## 2023-08-11 VITALS — BP 126/64 | HR 65 | Temp 98.1°F | Resp 16

## 2023-08-11 DIAGNOSIS — D509 Iron deficiency anemia, unspecified: Secondary | ICD-10-CM | POA: Insufficient documentation

## 2023-08-11 DIAGNOSIS — D500 Iron deficiency anemia secondary to blood loss (chronic): Secondary | ICD-10-CM

## 2023-08-11 DIAGNOSIS — Z79899 Other long term (current) drug therapy: Secondary | ICD-10-CM | POA: Diagnosis not present

## 2023-08-11 MED ORDER — ACETAMINOPHEN 325 MG PO TABS
650.0000 mg | ORAL_TABLET | Freq: Once | ORAL | Status: AC
  Administered 2023-08-11: 650 mg via ORAL
  Filled 2023-08-11: qty 2

## 2023-08-11 MED ORDER — FAMOTIDINE IN NACL 20-0.9 MG/50ML-% IV SOLN
20.0000 mg | Freq: Once | INTRAVENOUS | Status: AC
  Administered 2023-08-11: 20 mg via INTRAVENOUS
  Filled 2023-08-11: qty 50

## 2023-08-11 MED ORDER — FERUMOXYTOL INJECTION 510 MG/17 ML
510.0000 mg | Freq: Once | INTRAVENOUS | Status: AC
  Administered 2023-08-11: 510 mg via INTRAVENOUS
  Filled 2023-08-11: qty 510

## 2023-08-11 MED ORDER — CETIRIZINE HCL 10 MG PO TABS
10.0000 mg | ORAL_TABLET | Freq: Once | ORAL | Status: AC
  Administered 2023-08-11: 10 mg via ORAL
  Filled 2023-08-11: qty 1

## 2023-08-11 MED ORDER — SODIUM CHLORIDE 0.9 % IV SOLN
Freq: Once | INTRAVENOUS | Status: AC
Start: 2023-08-11 — End: 2023-08-11

## 2023-08-11 NOTE — Patient Instructions (Signed)

## 2023-08-11 NOTE — Progress Notes (Signed)
Patient tolerated iron infusion with no complaints voiced. Peripheral IV site clean and dry with good blood return noted before and after infusion. Band aid applied. VSS with discharge and left in satisfactory condition via wheelchair with no s/s of distress noted.

## 2023-08-12 NOTE — Progress Notes (Shared)
 Referring Physician(s): Rojelio Brenner, PA-C   Chief Complaint: The patient is seen in virtual video follow up today s/p IVC filter placement 10/24/22   History of present illness: HPI from last clinic visit 03/15/23 Stefanie [name] is an 83 y.o. 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 She was recently hospitalized 4/25-4/27 for leg swelling and was found to have a non-occluding DVT in her left femoral vein. Her fecal occult test was positive. She was treated with IV heparin and protonix. 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 Per Vascular recommendations, patient was ok to be discharged without IVC filter and without anticoagulation.     She presented to the [facility] ED 10/22/22 after a mechanical fall at home. Her hemoglobin level was stable compared to her hospital visit in April. 10/22/22 venous duplex was positive for "new acute occlusive deep vein thrombus in the left popliteal, posterior tibial and peroneal veins." She was started on IV heparin. Due to concerns for the patient's bleeding risk on anticoagulation IR was consulted for an IVC filter placement and this was performed by me 10/24/22.     She was discharged home 10/27/22 on Eliquis and she followed up with Hematology and GI as an outpatient. She declined further GI evaluation with EGD/Colonoscopy. She has remained on low dose Eliquis (2.5 mg BID). She recently had repeat imaging done of the left lower extremity.    She presents today via virtual tele-phone visit to discuss her recent imaging and next steps regarding the IVC filter. Today on the phone she was pleasantly demented without ability to discuss in detail the presence of her filter or history of VTE.  She stated she had no family with her today. I attempted to reach out to her grand daughter (Stefanie [name]) via telephone without answer.  She is an appropriate candidate for filter retrieval if desired by family/Hematology-Oncology. It is a low risk procedure which may prevent future complications. I forwarded this information to the Hematology/Oncology team given my challenges with contacting the patient's representatives and requested they notify our clinic if filter retrieval is desired.   She followed up with Hematology/Oncology 08/01/23 and the Vascular team is also in agreement for IVC filter retrieval. The SNF where the patient resides has been requested to help facilitate family involvement for this procedure. She remains on Eliquis 2.5 mg BID.  Past Medical History:  Diagnosis Date   Anxiety    Arthritis    Colon polyps    Depression    Diverticulosis of colon (without mention of hemorrhage)    Fibromyalgia    GERD (gastroesophageal reflux disease)    Hiatal hernia    History of IBS    History of kidney stones    Hypothyroidism    Iron deficiency anemia due to chronic blood loss 11/15/2022    Past Surgical History:  Procedure Laterality Date   ABDOMINAL HYSTERECTOMY     ANKLE SURGERY     bilateral    APPENDECTOMY  1985   BREAST BIOPSY Left    Benign    CHOLECYSTECTOMY  1985   ESOPHAGEAL MANOMETRY N/A 10/22/2012   Procedure: ESOPHAGEAL MANOMETRY (EM);  Surgeon: Mardella Layman, MD;  Location: [facility];  Service: Endoscopy;  Laterality: N/A;   ESOPHAGOGASTRODUODENOSCOPY N/A 10/01/2022  Procedure: ESOPHAGOGASTRODUODENOSCOPY (EGD);  Surgeon: [name], MD;  Location: [facility];  Service: Gastroenterology;  Laterality: N/A;   EXTRACORPOREAL SHOCK WAVE LITHOTRIPSY Left 11/26/2018   Procedure: EXTRACORPOREAL SHOCK WAVE LITHOTRIPSY (ESWL);  Surgeon: Marcine Matar, MD;  Location: [facility];  Service: Urology;  Laterality: Left;   HEMORRHOID SURGERY     IR IVC  FILTER PLMT / S&I /IMG GUID/MOD SED  10/24/2022   IR RADIOLOGIST EVAL & MGMT  03/15/2023   RECTOCELE REPAIR     TUBAL LIGATION      Allergies: Gabapentin, Lidocaine, Metoclopramide, Other, Sulfa antibiotics, Z-pak [azithromycin], Metoclopramide hcl, Penicillins, Procaine hcl, and Macrodantin [nitrofurantoin macrocrystal]  Medications: Prior to Admission medications   Medication Sig Start Date End Date Taking? Authorizing Provider  acetaminophen (TYLENOL) 325 MG tablet Take 2 tablets (650 mg total) by mouth every 6 (six) hours as needed for mild pain or fever. Patient not taking: Reported on 08/11/2023 12/06/22   Shon Hale, MD  apixaban (ELIQUIS) 2.5 MG TABS tablet Take 1 tablet (2.5 mg total) by mouth 2 (two) times daily. 03/13/23   Carnella Guadalajara, PA-C  escitalopram (LEXAPRO) 20 MG tablet Take 20 mg by mouth daily.    [provider]  ferrous sulfate 325 (65 FE) MG EC tablet Take 1 tablet (325 mg total) by mouth daily with breakfast. 03/13/23   Buford Dresser, Rebekah M, PA-C  fexofenadine (ALLEGRA) 60 MG tablet Take 1 tablet (60 mg total) by mouth 2 (two) times daily as needed for allergies or rhinitis. 10/27/22   Johnson, Clanford L, MD  lamoTRIgine (LAMICTAL) 100 MG tablet Take 100 mg by mouth daily.    [provider]  levothyroxine (SYNTHROID, LEVOTHROID) 75 MCG tablet Take 75 mcg by mouth at bedtime.    [provider]  pantoprazole (PROTONIX) 40 MG tablet Take 1 tablet (40 mg total) by mouth daily. 12/06/22 12/06/23  Shon Hale, MD  polyethylene glycol (MIRALAX / GLYCOLAX) 17 g packet Take 17 g by mouth daily as needed. 12/06/22   Shon Hale, MD  potassium chloride (KLOR-CON) 10 MEQ tablet Take 10 mEq by mouth daily. 08/17/22   [provider]  traZODone (DESYREL) 50 MG tablet Take 1 tablet (50 mg total) by mouth at bedtime as needed for sleep. 12/06/22   Shon Hale, MD  Vitamin D, Ergocalciferol, (DRISDOL) 1.25 MG (50000 UNIT) CAPS  capsule Take 50,000 Units by mouth every 7 (seven) days. 10/05/22   [provider]     Family History  Problem Relation Age of Onset   Colon polyps Brother    Breast cancer Neg Hx     Social History   Socioeconomic History   Marital status: Widowed    Spouse name: Not on file   Number of children: 1   Years of education: Not on file   Highest education level: Not on file  Occupational History   Occupation: retired    Associate Professor: RETIRED  Tobacco Use   Smoking status: Never   Smokeless tobacco: Never  Vaping Use   Vaping status: Never Used  Substance and Sexual Activity   Alcohol use: No    Alcohol/week: 0.0 standard drinks of alcohol   Drug use: No   Sexual activity: Not on file  Other Topics Concern   Not on file  Social History Narrative   Not on file   Social Drivers of Health   Financial Resource Strain: Low Risk  (07/19/2022)   Received from [facility], [facility]   Overall Financial  Resource Strain (CARDIA)    Difficulty of Paying Living Expenses: Not hard at all  Food Insecurity: Food Insecurity Present (10/22/2022)   Hunger Vital Sign    Worried About Running Out of Food in the Last Year: Sometimes true    Ran Out of Food in the Last Year: Sometimes true  Transportation Needs: No Transportation Needs (10/22/2022)   PRAPARE - Administrator, Civil Service (Medical): No    Lack of Transportation (Non-Medical): No  Physical Activity: Unknown (07/19/2022)   Received from [facility], [facility]   Exercise Vital Sign    Days of Exercise per Week: 0 days    Minutes of Exercise per Session: Not on file  Stress: Stress Concern Present (07/19/2022)   Received from [facility], [facility] of Occupational Health - Occupational Stress Questionnaire    Feeling of Stress : To some extent  Social Connections: Moderately Integrated (07/19/2022)   Received from [facility], [facility]   Social Network    How would  you rate your social network (family, work, friends)?: Adequate participation with social networks     Vital Signs: There were no vitals taken for this visit.  No physical exam was performed in lieu of virtual video visit.    Imaging: IVC Filter placement 10/24/22    [place] lower extremity venous duplex (02/22/23) IMPRESSION: No evidence of left lower extremity deep venous thrombosis. Previously noted left popliteal, posterior tibial and peroneal vein thrombus has completely resolved.  Labs:  CBC: Recent Labs    12/06/22 0403 12/28/22 0827 02/22/23 0908 08/01/23 0804  WBC 8.2 7.0 10.8* 7.8  HGB 9.1* 12.1 11.6* 11.0*  HCT 30.7* 39.6 37.3 35.5*  PLT 346 256 462* 393    COAGS: No results for input(s): "INR", "APTT" in the last 8760 hours.  BMP: Recent Labs    10/26/22 0428 10/31/22 0800 12/03/22 1718 12/06/22 0403  NA 136 134* 136 138  K 3.8 3.7 3.4* 3.8  CL 100 99 103 102  CO2 28 24 26 28   GLUCOSE 107* 108* 117* 109*  BUN 13 18 12 10   CALCIUM 8.4* 8.5* 7.9* 8.2*  CREATININE 0.65 0.60 0.60 0.66  GFRNONAA >60 >60 >60 >60    LIVER FUNCTION TESTS: Recent Labs    09/30/22 0900  BILITOT 0.6  AST 14*  ALT 10  ALKPHOS 105  PROT 6.2*  ALBUMIN 3.1*    Assessment and Plan: 83 year old female with a history of provoked left lower extremity DVT and GI bleed. She underwent IVC filter placement 10/24/22 due to anticoagulation intolerance. She has resumed low dose Eliquis and ultrasound imaging 02/22/23 of the left lower extremity demonstrates resolution of previously visualized DVT   Electronically Signed: Mickie Kay 08/12/2023, 5:01 PM   I spent a total of 25 Minutes in virtual video clinical consultation, greater than 50% of which was counseling/coordinating care for IVC filter placement.

## 2023-08-14 ENCOUNTER — Ambulatory Visit
Admission: RE | Admit: 2023-08-14 | Discharge: 2023-08-14 | Disposition: A | Discharge status: Home / Self Care | Source: Ambulatory Visit | Attending: Interventional Radiology

## 2023-08-14 DIAGNOSIS — Z95828 Presence of other vascular implants and grafts: Secondary | ICD-10-CM

## 2023-08-15 ENCOUNTER — Other Ambulatory Visit: Payer: Self-pay | Admitting: Interventional Radiology

## 2023-08-15 DIAGNOSIS — Z95828 Presence of other vascular implants and grafts: Secondary | ICD-10-CM

## 2023-08-16 ENCOUNTER — Encounter: Payer: Self-pay | Admitting: Interventional Radiology

## 2023-08-16 DIAGNOSIS — Z95828 Presence of other vascular implants and grafts: Secondary | ICD-10-CM

## 2023-08-18 ENCOUNTER — Inpatient Hospital Stay: Payer: Medicare Other

## 2023-08-18 VITALS — BP 120/64 | HR 77 | Temp 97.9°F | Resp 18

## 2023-08-18 DIAGNOSIS — D500 Iron deficiency anemia secondary to blood loss (chronic): Secondary | ICD-10-CM

## 2023-08-18 DIAGNOSIS — D509 Iron deficiency anemia, unspecified: Secondary | ICD-10-CM | POA: Diagnosis not present

## 2023-08-18 MED ORDER — CETIRIZINE HCL 10 MG PO TABS
10.0000 mg | ORAL_TABLET | Freq: Once | ORAL | Status: AC
  Administered 2023-08-18: 10 mg via ORAL
  Filled 2023-08-18: qty 1

## 2023-08-18 MED ORDER — FAMOTIDINE IN NACL 20-0.9 MG/50ML-% IV SOLN
20.0000 mg | Freq: Once | INTRAVENOUS | Status: AC
  Administered 2023-08-18: 20 mg via INTRAVENOUS
  Filled 2023-08-18: qty 50

## 2023-08-18 MED ORDER — ACETAMINOPHEN 325 MG PO TABS
650.0000 mg | ORAL_TABLET | Freq: Once | ORAL | Status: AC
  Administered 2023-08-18: 650 mg via ORAL
  Filled 2023-08-18: qty 2

## 2023-08-18 MED ORDER — SODIUM CHLORIDE 0.9 % IV SOLN: Freq: Once | INTRAVENOUS | Status: AC

## 2023-08-18 MED ORDER — FERUMOXYTOL INJECTION 510 MG/17 ML
510.0000 mg | Freq: Once | INTRAVENOUS | Status: AC
  Administered 2023-08-18: 510 mg via INTRAVENOUS
  Filled 2023-08-18: qty 510

## 2023-08-18 NOTE — Progress Notes (Signed)
 Patient presents today for Feraheme infusion per providers order.  Vital signs WNL.  Patient has no new complaints at this time.  Peripheral IV started and blood return noted pre and post infusion.  Stable during infusion without adverse affects.  Vital signs stable.  No complaints at this time.  Discharge from clinic ambulatory in stable condition.  Alert and oriented X 3.  Follow up with [facility] as scheduled.

## 2023-08-18 NOTE — Patient Instructions (Signed)
 [facility] [place] - A DEPT OF [facility]  Discharge Instructions: Thank you for choosing [facility] to provide your oncology and hematology care.  If you have a lab appointment with the Cancer Center - please note that after April 8th, 2024, all labs will be drawn in the cancer center.  You do not have to check in or register with the main entrance as you have in the past but will complete your check-in in the cancer center.  Wear comfortable clothing and clothing appropriate for easy access to any Portacath or PICC line.   We strive to give you quality time with your provider. You may need to reschedule your appointment if you arrive late (15 or more minutes).  Arriving late affects you and other patients whose appointments are after yours.  Also, if you miss three or more appointments without notifying the office, you may be dismissed from the clinic at the provider's discretion.      For prescription refill requests, have your pharmacy contact our office and allow 72 hours for refills to be completed.    Today you received the following chemotherapy and/or immunotherapy agents feraheme      To help prevent nausea and vomiting after your treatment, we encourage you to take your nausea medication as directed.  BELOW ARE SYMPTOMS THAT SHOULD BE REPORTED IMMEDIATELY: *FEVER GREATER THAN 100.4 F (38 C) OR HIGHER *CHILLS OR SWEATING *NAUSEA AND VOMITING THAT IS NOT CONTROLLED WITH YOUR NAUSEA MEDICATION *UNUSUAL SHORTNESS OF BREATH *UNUSUAL BRUISING OR BLEEDING *URINARY PROBLEMS (pain or burning when urinating, or frequent urination) *BOWEL PROBLEMS (unusual diarrhea, constipation, pain near the anus) TENDERNESS IN MOUTH AND THROAT WITH OR WITHOUT PRESENCE OF ULCERS (sore throat, sores in mouth, or a toothache) UNUSUAL RASH, SWELLING OR PAIN  UNUSUAL VAGINAL DISCHARGE OR ITCHING   Items with * indicate a potential emergency and should be followed up  as soon as possible or go to the Emergency Department if any problems should occur.  Please show the CHEMOTHERAPY ALERT CARD or IMMUNOTHERAPY ALERT CARD at check-in to the Emergency Department and triage nurse.  Should you have questions after your visit or need to cancel or reschedule your appointment, please contact [facility]  - A DEPT OF [facility] [phone]  and follow the prompts.  Office hours are 8:00 a.m. to 4:30 p.m. Monday - Friday. Please note that voicemails left after 4:00 p.m. may not be returned until the following business day.  We are closed weekends and major holidays. You have access to a nurse at all times for urgent questions. Please call the main number to the clinic [phone] and follow the prompts.  For any non-urgent questions, you may also contact your provider using MyChart. We now offer e-Visits for anyone 52 and older to request care online for non-urgent symptoms. For details visit [email or web address].   Also download the MyChart app! Go to the app store, search "MyChart", open the app, select [place], and log in with your MyChart username and password.

## 2023-08-28 NOTE — Progress Notes (Unsigned)
 Chief Complaint:anemia follow-up Primary GI Doctor:Dr. Rhea Belton  HPI:   Stefanie [name] is an 83 year old female with a past medical history as listed below including reflux, fibromyalgia and multiple others, who presents to clinic today for follow-up after hospitalization for anemia with heme positive stool.    09/30/2022 patient consulted by our service for heme positive anemia.  At that time recommended EGD.    10/01/2022 EGD with normal esophagus and a large amount of food residue in the stomach, medium sized hiatal hernia, gastritis and retained food in the duodenum.  Patient did not want to stay for further evaluation.    10/22/2022-10/27/2022 patient admitted to the hospital with new acute left lower extremity DVT.  Placed on Eliquis.  Also underwent IVC placement.  Recommended a full 6-12 weeks of anticoagulation before stopping therapy for any reason.  At that time noted her reflux and discussed incomplete EGD 10/01/22 due to poor prep and she declined further endoluminal evaluation was continued on Captomex twice daily.  Apparently hemoglobin did drop to 6.5 and she was transfused 1 unit on 10/24/2022.  Hemoglobin remained stable around 8.2.    11/03/2022 CBC with hemoglobin of 8.3 (stable over the past 2 weeks).  11/07/22 Last seen by Victorino Dike, PA at that time patient was doing well and continues on pantoprazole 40 mg twice daily. She rechecked hemoglobin and iron studies with ferritin.  Iron 19 and ferritin 12.4.  Hemoglobin 8.9.  Patient advised to stop oral iron supplementation. Patient currently declined evaluation with EGD/colonoscopy, but this could be reconsidered in the future if able to hold Eliquis and continuing to show signs of GI blood loss.   11/15/2022 Referred to hematology clinic.  Plans to schedule IV iron x 3.  US venous left lower leg (02/22/2023): No evidence of LLE DVT.  Previously noted left popliteal, posterior tibial, and peroneal vein thrombus has completely resolved.   08/01/23  Most recent Hem/onc visit to follow-up on IDA abd LLE DVT. Per notes: " She continues to take iron tablet once daily. She is taking Eliquis 2.5 mg twice daily. She received IV Feraheme x 2 on 11/21/2022 and 11/28/2022 (unable to receive her third dose due to hospitalization).  Feraheme x 1 given on 01/02/2023." - Labs (08/01/2023): Hgb 11.0/MCV 95.2, WBC 7.8/normal differential, platelets normal, ferritin 14, iron saturation 8% Recommend IV Iron x 2. Recheck labs in 4 mths.  Interval History     Patient presents for follow-up. Poor historian, caregiver at bedside. She currently resides at [facility]. She is wheelchair bound and transferred with [facility] lift. Patient denies blood in stool or tarry stool.Patient has bowel movement every other day. She states she has good appetite. She denies nausea or or vomiting. She denies GERD or dysphagia. Caregiver confirms no current issues. She takes antiacid medication for GERD, controlled. She takes stool softener twice daily for constipation, controlled.   Upcoming appointments: Patient scheduled April 11 th for vascular surgery and IR to have IVC filter retrieved. Patient's last IV iron infusion was March 14th, 2025. Follow-up in 1-2 mths. She has been continued on Eliquis 2.5mg  po twice daily. No discussion to discontinue.  Wt Readings from Last 3 Encounters:  08/29/23 135 lb (61.2 kg)  08/01/23 133 lb (60.3 kg)  12/03/22 149 lb (67.6 kg)    Past Medical History:  Diagnosis Date   Anxiety    Arthritis    Colon polyps    Depression    Diverticulosis of colon (without mention of hemorrhage)  Fibromyalgia    GERD (gastroesophageal reflux disease)    Hiatal hernia    History of IBS    History of kidney stones    Hypothyroidism    Iron deficiency anemia due to chronic blood loss 11/15/2022   Past Surgical History:  Procedure Laterality Date   ABDOMINAL HYSTERECTOMY     ANKLE SURGERY     bilateral    APPENDECTOMY  1985   BREAST BIOPSY Left    Benign     CHOLECYSTECTOMY  1985   ESOPHAGEAL MANOMETRY N/A 10/22/2012   Procedure: ESOPHAGEAL MANOMETRY (EM);  Surgeon: Mardella Layman, MD;  Location: [facility];  Service: Endoscopy;  Laterality: N/A;   ESOPHAGOGASTRODUODENOSCOPY N/A 10/01/2022   Procedure: ESOPHAGOGASTRODUODENOSCOPY (EGD);  Surgeon: [name], MD;  Location: [facility] ENDOSCOPY;  Service: Gastroenterology;  Laterality: N/A;   EXTRACORPOREAL SHOCK WAVE LITHOTRIPSY Left 11/26/2018   Procedure: EXTRACORPOREAL SHOCK WAVE LITHOTRIPSY (ESWL);  Surgeon: Marcine Matar, MD;  Location: [facility];  Service: Urology;  Laterality: Left;   HEMORRHOID SURGERY     IR IVC FILTER PLMT / S&I /IMG GUID/MOD SED  10/24/2022   IR RADIOLOGIST EVAL & MGMT  03/15/2023   RECTOCELE REPAIR     TUBAL LIGATION      Current Outpatient Medications  Medication Sig Dispense Refill   acetaminophen (TYLENOL) 325 MG tablet Take 2 tablets (650 mg total) by mouth every 6 (six) hours as needed for mild pain or fever. 100 tablet 2   apixaban (ELIQUIS) 2.5 MG TABS tablet Take 1 tablet (2.5 mg total) by mouth 2 (two) times daily. 60 tablet 11   escitalopram (LEXAPRO) 20 MG tablet Take 20 mg by mouth daily.     ferrous sulfate 325 (65 FE) MG EC tablet Take 1 tablet (325 mg total) by mouth daily with breakfast. 100 tablet 3   fexofenadine (ALLEGRA) 60 MG tablet Take 1 tablet (60 mg total) by mouth 2 (two) times daily as needed for allergies or rhinitis.     lamoTRIgine (LAMICTAL) 100 MG tablet Take 100 mg by mouth daily.     levothyroxine (SYNTHROID, LEVOTHROID) 75 MCG tablet Take 75 mcg by mouth at bedtime.     pantoprazole (PROTONIX) 40 MG tablet Take 1 tablet (40 mg total) by mouth daily. 30 tablet 3   polyethylene glycol (MIRALAX / GLYCOLAX) 17 g packet Take 17 g by mouth daily as needed. 30 each 0   potassium chloride (KLOR-CON) 10 MEQ tablet Take 10 mEq by mouth daily.     traZODone (DESYREL) 50 MG tablet Take 1 tablet (50 mg total) by mouth at bedtime as needed  for sleep. 30 tablet 1   Vitamin D, Ergocalciferol, (DRISDOL) 1.25 MG (50000 UNIT) CAPS capsule Take 50,000 Units by mouth every 7 (seven) days.     No current facility-administered medications for this visit.    Allergies as of 08/29/2023 - Review Complete 08/18/2023  Allergen Reaction Noted   Gabapentin  04/30/2021   Lidocaine Rash 08/26/2015   Metoclopramide Rash 08/26/2015   Other Other (See Comments) 11/26/2018   Sulfa antibiotics Rash 08/26/2015   Z-pak [azithromycin] Rash 04/08/2009   Metoclopramide hcl Other (See Comments) 04/08/2009   Penicillins Other (See Comments) 04/08/2009   Procaine hcl Other (See Comments) 04/08/2009   Macrodantin [nitrofurantoin macrocrystal] Rash 11/21/2018    Family History  Problem Relation Age of Onset   Colon polyps Brother    Breast cancer Neg Hx     Review of Systems:    Constitutional: No  weight loss, fever, chills, weakness or fatigue HEENT: Eyes: No change in vision               Ears, Nose, Throat:  No change in hearing or congestion Skin: No rash or itching Cardiovascular: No chest pain, chest pressure or palpitations   Respiratory: No SOB or cough Gastrointestinal: See HPI and otherwise negative Genitourinary: No dysuria or change in urinary frequency Neurological: No headache, dizziness or syncope Musculoskeletal: No new muscle or joint pain Hematologic: No bleeding or bruising Psychiatric: No history of depression or anxiety    Physical Exam:  Vital signs: BP 138/68   Pulse 77   Ht 5\' 4"  (1.626 m)   Wt 135 lb (61.2 kg)   SpO2 94%   BMI 23.17 kg/m   Constitutional:   Pleasant female appears to be in NAD, alert and cooperative, poor historian Throat: Oral cavity and pharynx without inflammation, swelling or lesion.  Respiratory: Respirations even and unlabored. Lungs clear to auscultation bilaterally.   No wheezes, crackles, or rhonchi.  Cardiovascular: Normal S1, S2. Regular rate and rhythm. No peripheral edema,  cyanosis or pallor.  Gastrointestinal:  Soft, nondistended, nontender. No rebound or guarding. Normal bowel sounds. No appreciable masses or hepatomegaly. Rectal:  Not performed.  Msk:  Symmetrical without gross deformities. Decreased ROM. Neurologic:  Alert and  oriented x4;  wheelchair bound Skin:   Dry and intact without significant lesions or rashes. Psychiatric: Oriented to person, place and time.   RELEVANT LABS AND IMAGING: CBC    Latest Ref Rng & Units 08/01/2023    8:04 AM 02/22/2023    9:08 AM 12/28/2022    8:27 AM  CBC  WBC 4.0 - 10.5 K/uL 7.8  10.8  7.0   Hemoglobin 12.0 - 15.0 g/dL 30.8  65.7  84.6   Hematocrit 36.0 - 46.0 % 35.5  37.3  39.6   Platelets 150 - 400 K/uL 393  462  256      CMP     Latest Ref Rng & Units 12/06/2022    4:03 AM 12/03/2022    5:18 PM 10/31/2022    8:00 AM  CMP  Glucose 70 - 99 mg/dL 962  952  841   BUN 8 - 23 mg/dL 10  12  18    Creatinine 0.44 - 1.00 mg/dL 3.24  4.01  0.27   Sodium 135 - 145 mmol/L 138  136  134   Potassium 3.5 - 5.1 mmol/L 3.8  3.4  3.7   Chloride 98 - 111 mmol/L 102  103  99   CO2 22 - 32 mmol/L 28  26  24    Calcium 8.9 - 10.3 mg/dL 8.2  7.9  8.5      Lab Results  Component Value Date   TSH 4.138 11/14/2022     Assessment: Encounter Diagnoses  Name Primary?   Iron deficiency anemia, unspecified iron deficiency anemia type Yes   Heme positive stool      83 year old female patient that presents for follow-up evaluation for anemia with heme positive stool discovered during hospitalization last April. Unfortunately she remains unstable enough to consider a good candidate for any invasive procedures, however the good news is her Hgb has improved and stable at 11. She is monitored and receiving IV iron with Hem/onc on a regular basis. No overt bleeding. Pt still on Eliquis twice daily for hx of DVT with no indication of discontinuing. Would recommend holding off on any procedures at this  time. If anything changes  instructed caregiver to contact our office.  Plan: - Followup with Hem/onc as scheduled for IV iron -6 month follow-up with APP and prn  Thank you for the courtesy of this consult. Please call me with any questions or concerns.   Prarthana Parlin, FNP-C [facility] 08/29/2023, 9:26 AM  Cc: [name], MD

## 2023-08-29 ENCOUNTER — Ambulatory Visit (INDEPENDENT_AMBULATORY_CARE_PROVIDER_SITE_OTHER): Payer: Medicare Other | Admitting: Gastroenterology

## 2023-08-29 ENCOUNTER — Encounter: Payer: Self-pay | Admitting: Gastroenterology

## 2023-08-29 VITALS — BP 138/68 | HR 77 | Ht 64.0 in | Wt 135.0 lb

## 2023-08-29 DIAGNOSIS — R195 Other fecal abnormalities: Secondary | ICD-10-CM | POA: Diagnosis not present

## 2023-08-29 DIAGNOSIS — D509 Iron deficiency anemia, unspecified: Secondary | ICD-10-CM | POA: Diagnosis not present

## 2023-08-29 NOTE — Patient Instructions (Signed)
 Thank you for trusting me with your gastrointestinal care!   Deanna May, NP     _______________________________________________________  If your blood pressure at your visit was 140/90 or greater, please contact your primary care physician to follow up on this.  _______________________________________________________  If you are age 83 or older, your body mass index should be between 23-30. Your Body mass index is 23.17 kg/m. If this is out of the aforementioned range listed, please consider follow up with your Primary Care Provider.  If you are age 62 or younger, your body mass index should be between 19-25. Your Body mass index is 23.17 kg/m. If this is out of the aformentioned range listed, please consider follow up with your Primary Care Provider.   ________________________________________________________  The [facility] providers would like to encourage you to use [facility] to communicate with providers for non-urgent requests or questions.  Due to long hold times on the telephone, sending your provider a message by [facility] may be a faster and more efficient way to get a response.  Please allow 48 business hours for a response.  Please remember that this is for non-urgent requests.  _______________________________________________________

## 2023-09-04 NOTE — Progress Notes (Signed)
 Addendum: Reviewed and agree with assessment and management plan. Asha Grumbine, Carie Caddy, MD

## 2023-09-14 NOTE — Progress Notes (Signed)
 Reminder call to Stefanie [name] regarding IVC retrieval  procedure scheduled for 09/15/23. I called her grand daughter several times and left messages but to no avail. Instructions given to [facility], admissions director. Devonne Doughty also stated Stefanie [name] uses a hoyer lift and is unable to stand.Public relations account executive made aware.

## 2023-09-14 NOTE — H&P (Signed)
 Chief Complaint: Patient was seen in consultation today for LLE DVT  Referring Physician(s): Rojelio Brenner, PA-C  Supervising Physician: Marliss Coots  Patient Status: DRI- Stefanie [name]  History of Present Illness: Stefanie [name] is a 83 y.o. 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 She was hospitalized 4/25-4/27/2024 for leg swelling and was found to have a non-occluding DVT in her left femoral vein.  Due to age and comorbidities she was discharge home without filter or anticoagulation.  Shortly after she sustained a fall at home and during evaluation repeat imaging showed further extension of her DVT.  An IVC filter was placed 10/24/22 by Dr. Elby Showers.  She returned for routine assessment of her filter status 03/15/23 at which time the long-term plan for filter need was not clear.  Since this time patient has become a SNF resident and her family has become more involved in her care.  At most recent follow-up with Heme/Onc 08/01/23 it was determined she can resume anticoagulation and her filter can be removed.   Patient presents to [facility] for same.  She is accompanied by staff from [facility].  Her daughter is available by phone.  She has been NPO.  Procedure reviewed and all are agreeable to proceed.   Past Medical History:  Diagnosis Date   Anxiety    Arthritis    Colon polyps    Depression    Diverticulosis of colon (without mention of hemorrhage)    Fibromyalgia    GERD (gastroesophageal reflux disease)    Hiatal hernia    History of IBS    History of kidney stones    Hypothyroidism    Iron deficiency anemia due to chronic blood loss 11/15/2022    Past Surgical History:  Procedure Laterality Date   ABDOMINAL HYSTERECTOMY     ANKLE SURGERY     bilateral    APPENDECTOMY  1985   BREAST BIOPSY  Left    Benign    CHOLECYSTECTOMY  1985   ESOPHAGEAL MANOMETRY N/A 10/22/2012   Procedure: ESOPHAGEAL MANOMETRY (EM);  Surgeon: Mardella Layman, MD;  Location: [facility];  Service: Endoscopy;  Laterality: N/A;   ESOPHAGOGASTRODUODENOSCOPY N/A 10/01/2022   Procedure: ESOPHAGOGASTRODUODENOSCOPY (EGD);  Surgeon: [name], MD;  Location: [facility] ENDOSCOPY;  Service: Gastroenterology;  Laterality: N/A;   EXTRACORPOREAL SHOCK WAVE LITHOTRIPSY Left 11/26/2018   Procedure: EXTRACORPOREAL SHOCK WAVE LITHOTRIPSY (ESWL);  Surgeon: Marcine Matar, MD;  Location: [facility];  Service: Urology;  Laterality: Left;   HEMORRHOID SURGERY     IR IVC FILTER PLMT / S&I /IMG GUID/MOD SED  10/24/2022   IR RADIOLOGIST EVAL & MGMT  03/15/2023   RECTOCELE REPAIR     TUBAL LIGATION      Allergies: Gabapentin, Lidocaine, Metoclopramide, Other, Sulfa antibiotics, Z-pak [azithromycin], Metoclopramide hcl, Penicillins, Procaine hcl, and Macrodantin [nitrofurantoin macrocrystal]  Medications: Prior to Admission medications   Medication Sig Start Date End Date Taking? Authorizing Provider  acetaminophen (TYLENOL) 325 MG tablet Take 2 tablets (650 mg total) by mouth every 6 (six) hours as needed for mild pain or fever. 12/06/22   Shon Hale, MD  apixaban (ELIQUIS) 2.5 MG TABS tablet Take 1 tablet (2.5 mg total) by mouth 2 (two) times daily. 03/13/23   Carnella Guadalajara, PA-C  escitalopram (LEXAPRO) 20 MG tablet Take 20 mg by mouth daily.    [provider]  ferrous sulfate 325 (65 FE) MG EC tablet Take 1 tablet (325 mg total) by mouth daily with breakfast. 03/13/23   Buford Dresser, Rebekah M, PA-C  fexofenadine (ALLEGRA) 60 MG tablet Take 1 tablet (60 mg total) by mouth 2 (two) times daily as needed for allergies or rhinitis. 10/27/22   Johnson, Clanford L, MD  lamoTRIgine (LAMICTAL) 100 MG tablet Take 100 mg by mouth daily.    [provider]  levothyroxine (SYNTHROID, LEVOTHROID) 75 MCG tablet  Take 75 mcg by mouth at bedtime.    [provider]  pantoprazole (PROTONIX) 40 MG tablet Take 1 tablet (40 mg total) by mouth daily. 12/06/22 12/06/23  Shon Hale, MD  polyethylene glycol (MIRALAX / GLYCOLAX) 17 g packet Take 17 g by mouth daily as needed. 12/06/22   Shon Hale, MD  potassium chloride (KLOR-CON) 10 MEQ tablet Take 10 mEq by mouth daily. 08/17/22   [provider]  traZODone (DESYREL) 50 MG tablet Take 1 tablet (50 mg total) by mouth at bedtime as needed for sleep. 12/06/22   Shon Hale, MD  Vitamin D, Ergocalciferol, (DRISDOL) 1.25 MG (50000 UNIT) CAPS capsule Take 50,000 Units by mouth every 7 (seven) days. 10/05/22   [provider]     Family History  Problem Relation Age of Onset   Colon polyps Brother    Breast cancer Neg Hx     Social History   Socioeconomic History   Marital status: Widowed    Spouse name: Not on file   Number of children: 1   Years of education: Not on file   Highest education level: Not on file  Occupational History   Occupation: retired    Associate Professor: RETIRED  Tobacco Use   Smoking status: Never   Smokeless tobacco: Never  Vaping Use   Vaping status: Never Used  Substance and Sexual Activity   Alcohol use: No    Alcohol/week: 0.0 standard drinks of alcohol   Drug use: No   Sexual activity: Not on file  Other Topics Concern   Not on file  Social History Narrative   Not on file   Social Drivers of Health   Financial Resource Strain: Low Risk  (07/19/2022)   Received from [facility], [facility]   Overall Financial Resource Strain (CARDIA)    Difficulty of Paying Living Expenses: Not hard at all  Food Insecurity: Food Insecurity Present (10/22/2022)   Hunger Vital Sign    Worried About Running Out of Food in the Last Year: Sometimes true    Ran Out of Food in the Last Year: Sometimes true  Transportation Needs: No Transportation Needs (10/22/2022)   PRAPARE - Scientist, research (physical sciences) (Medical): No    Lack of Transportation (Non-Medical): No  Physical Activity: Unknown (07/19/2022)   Received from [facility], [facility]   Exercise Vital Sign    Days of Exercise per Week: 0 days    Minutes of Exercise per Session: Not on file  Stress: Stress Concern Present (07/19/2022)   Received from [facility], [facility] - Occupational Stress Questionnaire    Feeling of Stress : To some extent  Social Connections: Moderately Integrated (07/19/2022)   Received from [facility], [facility]   Social Network    How would you rate your social network (family, work, friends)?: Adequate participation with social networks     Review of Systems: A 12 point ROS discussed and pertinent  positives are indicated in the HPI above.  All other systems are negative.  Review of Systems  Constitutional:  Negative for fatigue and fever.  Respiratory:  Negative for cough and shortness of breath.   Cardiovascular:  Negative for chest pain.  Gastrointestinal:  Negative for abdominal pain, nausea and vomiting.  Musculoskeletal:  Negative for back pain.  Psychiatric/Behavioral:  Negative for behavioral problems and confusion.     Vital Signs: There were no vitals taken for this visit.  Physical Exam Vitals and nursing note reviewed.  Constitutional:      General: She is not in acute distress.    Appearance: Normal appearance. She is not ill-appearing.  HENT:     Mouth/Throat:     Mouth: Mucous membranes are moist.     Pharynx: Oropharynx is clear.  Cardiovascular:     Rate and Rhythm: Normal rate and regular rhythm.  Pulmonary:     Effort: Pulmonary effort is normal. No respiratory distress.     Breath sounds: Normal breath sounds.  Abdominal:     General: Abdomen is flat. There is no distension.     Palpations: Abdomen is soft.  Skin:    General: Skin is warm and dry.  Neurological:     General: No focal deficit  present.     Mental Status: She is alert and oriented to person, place, and time. Mental status is at baseline.  Psychiatric:        Mood and Affect: Mood normal.        Behavior: Behavior normal.        Thought Content: Thought content normal.        Judgment: Judgment normal.          Imaging: No results found.  Labs:  CBC: Recent Labs    12/06/22 0403 12/28/22 0827 02/22/23 0908 08/01/23 0804  WBC 8.2 7.0 10.8* 7.8  HGB 9.1* 12.1 11.6* 11.0*  HCT 30.7* 39.6 37.3 35.5*  PLT 346 256 462* 393    COAGS: No results for input(s): "INR", "APTT" in the last 8760 hours.  BMP: Recent Labs    10/26/22 0428 10/31/22 0800 12/03/22 1718 12/06/22 0403  NA 136 134* 136 138  K 3.8 3.7 3.4* 3.8  CL 100 99 103 102  CO2 28 24 26 28   GLUCOSE 107* 108* 117* 109*  BUN 13 18 12 10   CALCIUM 8.4* 8.5* 7.9* 8.2*  CREATININE 0.65 0.60 0.60 0.66  GFRNONAA >60 >60 >60 >60    LIVER FUNCTION TESTS: Recent Labs    09/30/22 0900  BILITOT 0.6  AST 14*  ALT 10  ALKPHOS 105  PROT 6.2*  ALBUMIN 3.1*    TUMOR MARKERS: No results for input(s): "AFPTM", "CEA", "CA199", "CHROMGRNA" in the last 8760 hours.  Assessment and Plan: Patient with past medical history of IDA, left lower extremity DVT who is s/p IVC filter placement 10/24/22 by Dr. Elby Showers presents for filter removal after reinitiation of anticoagulation with tolerance.   Case reviewed by Dr. Elby Showers who approves patient for procedure.  Patient presents today in their usual state of health.  She has been NPO and is not currently on blood thinners.   Risks and benefits discussed with the patient including, but not limited to bleeding, infection, contrast induced renal failure, filter fracture or migration which can lead to emergency surgery or even death, strut penetration with damage or irritation to adjacent structures and caval thrombosis.  All of the patient's questions were answered, patient is agreeable  to  proceed. Consent signed and in chart.  Advance Care Plan: The advanced care plan/surrogate decision maker was discussed at the time of visit and documented in the medical record.     Thank you for this interesting consult.  I greatly enjoyed meeting [facility] and look forward to participating in their care.  A copy of this report was sent to the requesting provider on this date.  Electronically Signed: Hoyt Koch, PA 09/14/2023, 3:40 PM   I spent a total of    15 Minutes in face to face in clinical consultation, greater than 50% of which was counseling/coordinating care for LLE DVT.

## 2023-09-14 NOTE — Discharge Instructions (Signed)
 Diet You may resume your regular diet. You should drink at least six 8-ounce glasses of water over the next 24 hours. Water helps to clear the dye used during the procedure. Activity On the day you leave the hospital, limit your activities. No physical exercise or heavy lifting (greater than 10 lbs.) for the next 3 days. Do not drive for 24 hours after the procedure. You may resume all other daily activities 24 hours after the test.  Please call our office [phone] if you notice any of the following: ? Swelling or bleeding at the puncture site. If the site is bleeding hold pressure to the  site for 5 to 10 minutes and call us. ? Fever higher than 101? F. ? Redness at the puncture site, or increasing tenderness or discharge at the puncture site. Follow-up Care Once your filter is removed you will now follow up with your primary care doctor.  Whenever you see a new doctor for the first time, you should mention you have had a history of blood clots and an IVC filter placed in the past. This is important if you are: ? Having surgery. ? Being treated for a new condition. ? Taking new medicines. If you are taking anticoagulation or blood thinning medicines, such as Coumadin, Lovenox, or Fragmin, be sure to have the needed routine tests to check your blood's clotting time. If you have signs of bleeding or bruising while taking these medicines contact your doctor. If you have signs or symptoms of blood clots, contact your doctor right away or go to the nearest emergency room. These symptoms include: ? Swelling, pain, or warmth in one of your legs. ? Sudden shortness of breath, coughing, or wheezing. ? Chest pain, palpitations.  If you need to speak to someone after hours (5:00pm) please contact the on-call IR MD at [phone]. Tell them you are a patient of Dr. Elby Showers, you had IVC retrieval today and any issues you are experiencing.  Thank you for visiting [facility]!!

## 2023-09-15 ENCOUNTER — Telehealth

## 2023-09-15 ENCOUNTER — Ambulatory Visit
Admission: RE | Admit: 2023-09-15 | Discharge: 2023-09-15 | Disposition: A | Discharge status: Home / Self Care | Source: Ambulatory Visit | Attending: Interventional Radiology | Admitting: Interventional Radiology

## 2023-09-15 HISTORY — PX: IR IVC FILTER RETRIEVAL / S&I /IMG GUID/MOD SED: IMG5308

## 2023-09-15 MED ORDER — MIDAZOLAM HCL 2 MG/2ML IJ SOLN
INTRAMUSCULAR | Status: AC | PRN
  Administered 2023-09-15: 1 mg via INTRAVENOUS
  Administered 2023-09-15 (×2): .5 mg via INTRAVENOUS

## 2023-09-15 MED ORDER — IOPAMIDOL (ISOVUE-300) INJECTION 61%
100.0000 mL | Freq: Once | INTRAVENOUS | Status: AC | PRN
  Administered 2023-09-15: 40 mL via INTRAVENOUS

## 2023-09-15 MED ORDER — CHLOROPROCAINE HCL 1 % IJ SOLN
20.0000 mL | Freq: Once | INTRAMUSCULAR | Status: AC
  Administered 2023-09-15: 5 mL

## 2023-09-15 MED ORDER — FENTANYL CITRATE (PF) 100 MCG/2ML IJ SOLN
INTRAMUSCULAR | Status: AC | PRN
  Administered 2023-09-15: 25 ug via INTRAVENOUS
  Administered 2023-09-15: 50 ug via INTRAVENOUS
  Administered 2023-09-15: 25 ug via INTRAVENOUS

## 2023-09-15 NOTE — Procedures (Signed)
Interventional Radiology Procedure Note  Procedure: IVC filter retrieval  Findings: Please refer to procedural dictation for full description.    Complications: None immediate  Estimated Blood Loss: < 5 mL  Recommendations: Follow up with IR as needed.   Marliss Coots, MD

## 2023-11-29 ENCOUNTER — Inpatient Hospital Stay: Payer: Medicare Other | Admitting: Physician Assistant

## 2023-11-29 ENCOUNTER — Inpatient Hospital Stay: Payer: Medicare Other

## 2023-12-18 NOTE — Progress Notes (Unsigned)
 [facility] [address], [place] [address]   CLINIC:  Medical Oncology/Hematology  PCP:  Pura Lenis, MD [address] [place] [address] [phone]   REASON FOR VISIT:  Follow-up for iron deficiency anemia and left lower extremity DVT  CURRENT THERAPY: Eliquis  + IV Feraheme    INTERVAL HISTORY:   Stefanie [name] 83 y.o. female returns for routine follow-up of her iron deficiency anemia and left lower extremity DVT.  She was last seen by Pleasant Barefoot PA-C on 08/01/2023.  She is accompanied today by a staff member from SNF.***  Interim since last visit, she had IVC filter retrieval via IR on 09/15/2023.    At today's visit, she reports feeling fairly well, although she is a difficult historian due to her underlying dementia.***  She reports some headaches and fatigue.*** ***Denies any pica, lightheadedness, syncope, chest pain, difficulty breathing. ***Patient continues to deny any rectal bleeding, melena, or other signs of major bleeding. ***Her left leg edema has improved, but she has some lingering left leg pain. ***She has not had any falls since she has been at [facility]. ***She continues to take iron tablet once daily. ***She is taking Eliquis  2.5 mg twice daily.  She has 50***% energy and 75***% appetite. She endorses that she is maintaining a stable weight.  ASSESSMENT & PLAN:  1.  Recurrent LLE DVT - Initial episode of left leg superficial venous thrombophlebitis in January 2024, treated with Eliquis  through April 2024. - Venous US  left leg (08/24/2022): No evidence of acute or chronic DVT or SVT in left leg - Venous US  (09/29/2022): Nonocclusive DVT in left femoral vein - Venous US  (09/30/2022): Chronic DVT of left gastrocnemius veins and findings consistent with chronic superficial thrombosis in the left small saphenous vein - Venous US  (10/22/2022) showed new acute occlusive DVT seen in left popliteal, posterior tibial, and  peroneal veins. - 10/22/2022-10/27/2022 patient admitted to the hospital with new acute left lower extremity DVT.  Placed on back on Eliquis  as of 10/26/2022. IVC placement on 10/24/2022 due to concern for concurrent GI bleeding.  IR recommended a full 6-12 weeks of anticoagulation before stopping therapy for any reason - Concern regarding chronic anticoagulation due to fall risk and suspected GI blood loss.  Per risk/benefit discussion with family during hospitalization in May 2024, family opted for continued anticoagulation with acceptance of associated risks. - US  venous left lower leg (02/22/2023): No evidence of LLE DVT.  Previously noted left popliteal, posterior tibial, and peroneal vein thrombus has completely resolved. - IVC filter retrieval via IR on 09/15/2023 - Coagulopathy panel (02/22/2023): Lupus anticoagulant, anticardiolipin, beta-2  glycoprotein 1 antibodies are NEGATIVE.  No PT gene mutation or factor V Leiden. - Patient's brother also had recurrent DVTs - SVT/DVT weakly provoked due to decreased mobility (ambulating with walker) and boot on left leg due to recent Achilles tendon injury - She continues to have ongoing risk factors including limited mobility*** - She remains at risk for falls, but denies any fall within the past 6 months.  She denies any obvious rectal bleeding or melena.*** - PLAN: Due to ongoing risk factors for recurrent DVT, recommend low-dose Eliquis  2.5 mg twice daily.   2.  Iron deficiency anemia - Hgb 10.2 on 07/01/2022 (prior to starting Eliquis  for initial episode of superficial thrombophlebitis) - During hospitalization in April 2024, patient had heme positive stool, Hgb down to 7.9.  EGD from 10/01/2022 showed gastritis, but limited evaluation due to large amount of  food residue in stomach/duodenum. - During hospitalization in May 2024, Hgb dropped to 6.5, and she was transfused 1 unit PRBC on 10/24/2022.  Family opted to continue anticoagulation after  risk/benefit discussion with hospitalist. - Following with [facility] (Dr. Albertus / Delon Failing, PA-C) - patient declined further evaluation with EGD/colonoscopy, but this could be reconsidered in the future if able to hold Eliquis  and continuing to show signs of GI blood loss. - Patient denies any rectal bleeding or melena. *** Reports mild fatigue and headaches. - Most recent IV Feraheme  x 2 in March 2025 - Labs*** - PLAN: *** TBD.  *** Recommend IV Feraheme  x 2 - Continue daily iron supplement - Recheck CBC and iron panel with RTC in 4 months - Continue gastroenterology follow-up with [facility]   3.  Other history - PMH: IBS, GERD, hypothyroidism, fibromyalgia, anxiety/depression, arthritis - SOCIAL: Patient is widowed.  She was living at home alone prior to hospitalization in May 2024.  She currently resides at [facility] for acute rehab.  She denies any alcohol, tobacco, or illicit substance use. - FAMILY: Younger brother has had blood clots, but she does not know the details.  She denies any family history of cancer.  She had one daughter, who passed away at age 70 due to brain aneurysm.     PLAN SUMMARY: *** TBD.  *** >> IV Feraheme  x 2 >> Labs in 4 months = CBC/D, ferritin, iron/TIBC >> OFFICE visit in 4 months (after labs)      REVIEW OF SYSTEMS:***  Review of Systems  Constitutional:  Positive for fatigue (waxes and wanes). Negative for appetite change, chills, diaphoresis, fever and unexpected weight change.  HENT:   Negative for lump/mass and nosebleeds.   Eyes:  Negative for eye problems.  Respiratory:  Positive for cough. Negative for hemoptysis and shortness of breath.   Cardiovascular:  Negative for chest pain, leg swelling and palpitations.  Gastrointestinal:  Positive for constipation. Negative for abdominal pain, blood in stool, diarrhea, nausea and vomiting.  Genitourinary:  Negative for hematuria.   Skin:  Positive for itching.  Neurological:  Positive  for dizziness and headaches. Negative for light-headedness.  Hematological:  Does not bruise/bleed easily.  Psychiatric/Behavioral:  Positive for depression.      PHYSICAL EXAM:***  ECOG PERFORMANCE STATUS: 3 - Symptomatic, >50% confined to bed /wheelchair dependent  There were no vitals filed for this visit.   Physical Exam Constitutional:      Appearance: Normal appearance.     Comments: Presents in wheelchair  Cardiovascular:     Rate and Rhythm: Normal rate and regular rhythm.     Pulses: Normal pulses.     Heart sounds: Normal heart sounds.  Pulmonary:     Effort: Pulmonary effort is normal.     Breath sounds: Normal breath sounds.  Musculoskeletal:        General: No swelling.     Right lower leg: No edema.     Left lower leg: No edema.  Skin:    General: Skin is warm and dry.  Neurological:     General: No focal deficit present.     Mental Status: She is alert and oriented to person, place, and time.  Psychiatric:        Mood and Affect: Mood normal.        Behavior: Behavior normal.        Cognition and Memory: She exhibits impaired recent memory.    PAST MEDICAL/SURGICAL HISTORY:  Past  Medical History:  Diagnosis Date   Anxiety    Arthritis    Colon polyps    Depression    Diverticulosis of colon (without mention of hemorrhage)    Fibromyalgia    GERD (gastroesophageal reflux disease)    Hiatal hernia    History of IBS    History of kidney stones    Hypothyroidism    Iron deficiency anemia due to chronic blood loss 11/15/2022   Past Surgical History:  Procedure Laterality Date   ABDOMINAL HYSTERECTOMY     ANKLE SURGERY     bilateral    APPENDECTOMY  1985   BREAST BIOPSY Left    Benign    CHOLECYSTECTOMY  1985   ESOPHAGEAL MANOMETRY N/A 10/22/2012   Procedure: ESOPHAGEAL MANOMETRY (EM);  Surgeon: Alm JONELLE Gander, MD;  Location: [facility] ENDOSCOPY;  Service: Endoscopy;  Laterality: N/A;   ESOPHAGOGASTRODUODENOSCOPY N/A 10/01/2022   Procedure:  ESOPHAGOGASTRODUODENOSCOPY (EGD);  Surgeon: Eda Iha, MD;  Location: [facility] ENDOSCOPY;  Service: Gastroenterology;  Laterality: N/A;   EXTRACORPOREAL SHOCK WAVE LITHOTRIPSY Left 11/26/2018   Procedure: EXTRACORPOREAL SHOCK WAVE LITHOTRIPSY (ESWL);  Surgeon: Matilda Senior, MD;  Location: [facility] ORS;  Service: Urology;  Laterality: Left;   HEMORRHOID SURGERY     IR IVC FILTER PLMT / S&I /IMG GUID/MOD SED  10/24/2022   IR IVC FILTER RETRIEVAL / S&I /IMG GUID/MOD SED  09/15/2023   IR RADIOLOGIST EVAL & MGMT  03/15/2023   RECTOCELE REPAIR     TUBAL LIGATION      SOCIAL HISTORY:  Social History   Socioeconomic History   Marital status: Widowed    Spouse name: Not on file   Number of children: 1   Years of education: Not on file   Highest education level: Not on file  Occupational History   Occupation: retired    Associate Professor: RETIRED  Tobacco Use   Smoking status: Never   Smokeless tobacco: Never  Vaping Use   Vaping status: Never Used  Substance and Sexual Activity   Alcohol use: No    Alcohol/week: 0.0 standard drinks of alcohol   Drug use: No   Sexual activity: Not on file  Other Topics Concern   Not on file  Social History Narrative   Not on file   Social Drivers of Health   Financial Resource Strain: Low Risk  (07/19/2022)   Received from [facility]   Overall Financial Resource Strain (CARDIA)    Difficulty of Paying Living Expenses: Not hard at all  Food Insecurity: Food Insecurity Present (10/22/2022)   Hunger Vital Sign    Worried About Running Out of Food in the Last Year: Sometimes true    Ran Out of Food in the Last Year: Sometimes true  Transportation Needs: No Transportation Needs (10/22/2022)   PRAPARE - Administrator, Civil Service (Medical): No    Lack of Transportation (Non-Medical): No  Physical Activity: Unknown (07/19/2022)   Received from [facility]   Exercise Vital Sign    On average, how many days per week do you engage in moderate  to strenuous exercise (like a brisk walk)?: 0 days    Minutes of Exercise per Session: Not on file  Stress: Stress Concern Present (07/19/2022)   Received from [facility] - Occupational Stress Questionnaire    Feeling of Stress : To some extent  Social Connections: Moderately Integrated (07/19/2022)   Received from [facility]   Social Network  How would you rate your social network (family, work, friends)?: Adequate participation with social networks  Intimate Partner Violence: Not At Risk (10/22/2022)   Humiliation, Afraid, Rape, and Kick questionnaire    Fear of Current or Ex-Partner: No    Emotionally Abused: No    Physically Abused: No    Sexually Abused: No    FAMILY HISTORY:  Family History  Problem Relation Age of Onset   Colon polyps Brother    Breast cancer Neg Hx     CURRENT MEDICATIONS:  Outpatient Encounter Medications as of 12/19/2023  Medication Sig   acetaminophen  (TYLENOL ) 325 MG tablet Take 2 tablets (650 mg total) by mouth every 6 (six) hours as needed for mild pain or fever.   apixaban  (ELIQUIS ) 2.5 MG TABS tablet Take 1 tablet (2.5 mg total) by mouth 2 (two) times daily.   escitalopram  (LEXAPRO ) 20 MG tablet Take 20 mg by mouth daily.   ferrous sulfate  325 (65 FE) MG EC tablet Take 1 tablet (325 mg total) by mouth daily with breakfast.   fexofenadine  (ALLEGRA ) 60 MG tablet Take 1 tablet (60 mg total) by mouth 2 (two) times daily as needed for allergies or rhinitis.   lamoTRIgine (LAMICTAL) 100 MG tablet Take 100 mg by mouth daily.   levothyroxine  (SYNTHROID , LEVOTHROID) 75 MCG tablet Take 75 mcg by mouth at bedtime.   pantoprazole  (PROTONIX ) 40 MG tablet Take 1 tablet (40 mg total) by mouth daily.   polyethylene glycol (MIRALAX  / GLYCOLAX ) 17 g packet Take 17 g by mouth daily as needed.   potassium chloride  (KLOR-CON ) 10 MEQ tablet Take 10 mEq by mouth daily.   traZODone  (DESYREL ) 50 MG tablet Take 1 tablet (50  mg total) by mouth at bedtime as needed for sleep.   Vitamin D, Ergocalciferol, (DRISDOL) 1.25 MG (50000 UNIT) CAPS capsule Take 50,000 Units by mouth every 7 (seven) days.   No facility-administered encounter medications on file as of 12/19/2023.    ALLERGIES:  Allergies  Allergen Reactions   Gabapentin      Dizziness. States made her feel like she was drunk. Unsteady gait and dizziness.         Lidocaine  Rash   Metoclopramide Rash   Other Other (See Comments)    DYE+Nitrofurantoin+Brilliant Blue Fcf   Sulfa Antibiotics Rash   Z-Pak [Azithromycin] Rash   Metoclopramide Hcl Other (See Comments)    REACTION: a drawing of all her muscles   Penicillins Other (See Comments)    Unknown    Procaine Hcl Other (See Comments)    Unknown    Macrodantin [Nitrofurantoin Macrocrystal] Rash    LABORATORY DATA:  I have reviewed the labs as listed.  CBC    Component Value Date/Time   WBC 7.8 08/01/2023 0804   RBC 3.73 (L) 08/01/2023 0804   HGB 11.0 (L) 08/01/2023 0804   HCT 35.5 (L) 08/01/2023 0804   PLT 393 08/01/2023 0804   MCV 95.2 08/01/2023 0804   MCH 29.5 08/01/2023 0804   MCHC 31.0 08/01/2023 0804   RDW 14.5 08/01/2023 0804   LYMPHSABS 2.0 08/01/2023 0804   MONOABS 0.8 08/01/2023 0804   EOSABS 0.3 08/01/2023 0804   BASOSABS 0.1 08/01/2023 0804      Latest Ref Rng & Units 12/06/2022    4:03 AM 12/03/2022    5:18 PM 10/31/2022    8:00 AM  CMP  Glucose 70 - 99 mg/dL 890  882  891   BUN 8 - 23 mg/dL 10  12  18   Creatinine 0.44 - 1.00 mg/dL 9.33  9.39  9.39   Sodium 135 - 145 mmol/L 138  136  134   Potassium 3.5 - 5.1 mmol/L 3.8  3.4  3.7   Chloride 98 - 111 mmol/L 102  103  99   CO2 22 - 32 mmol/L 28  26  24    Calcium 8.9 - 10.3 mg/dL 8.2  7.9  8.5     DIAGNOSTIC IMAGING:  I have independently reviewed the relevant imaging and discussed with the patient.   WRAP UP:  All questions were answered. The patient knows to call the clinic with any problems, questions or  concerns.  Medical decision making: Moderate  Time spent on visit: I spent 20 minutes counseling the patient face to face. The total time spent in the appointment was 30 minutes and more than 50% was on counseling.  Pleasant CHRISTELLA Barefoot, PA-C  ***

## 2023-12-19 ENCOUNTER — Inpatient Hospital Stay: Attending: Hematology

## 2023-12-19 ENCOUNTER — Inpatient Hospital Stay (HOSPITAL_BASED_OUTPATIENT_CLINIC_OR_DEPARTMENT_OTHER): Admitting: Physician Assistant

## 2023-12-19 VITALS — BP 126/74 | HR 69 | Temp 97.9°F | Resp 18

## 2023-12-19 DIAGNOSIS — D500 Iron deficiency anemia secondary to blood loss (chronic): Secondary | ICD-10-CM

## 2023-12-19 DIAGNOSIS — F32A Depression, unspecified: Secondary | ICD-10-CM | POA: Insufficient documentation

## 2023-12-19 DIAGNOSIS — Z860100 Personal history of colon polyps, unspecified: Secondary | ICD-10-CM | POA: Insufficient documentation

## 2023-12-19 DIAGNOSIS — I471 Supraventricular tachycardia, unspecified: Secondary | ICD-10-CM | POA: Diagnosis not present

## 2023-12-19 DIAGNOSIS — Z9049 Acquired absence of other specified parts of digestive tract: Secondary | ICD-10-CM | POA: Insufficient documentation

## 2023-12-19 DIAGNOSIS — Z8672 Personal history of thrombophlebitis: Secondary | ICD-10-CM | POA: Insufficient documentation

## 2023-12-19 DIAGNOSIS — M2550 Pain in unspecified joint: Secondary | ICD-10-CM | POA: Insufficient documentation

## 2023-12-19 DIAGNOSIS — K589 Irritable bowel syndrome without diarrhea: Secondary | ICD-10-CM | POA: Diagnosis not present

## 2023-12-19 DIAGNOSIS — Z8719 Personal history of other diseases of the digestive system: Secondary | ICD-10-CM | POA: Insufficient documentation

## 2023-12-19 DIAGNOSIS — D509 Iron deficiency anemia, unspecified: Secondary | ICD-10-CM | POA: Insufficient documentation

## 2023-12-19 DIAGNOSIS — E039 Hypothyroidism, unspecified: Secondary | ICD-10-CM | POA: Insufficient documentation

## 2023-12-19 DIAGNOSIS — Z881 Allergy status to other antibiotic agents status: Secondary | ICD-10-CM | POA: Diagnosis not present

## 2023-12-19 DIAGNOSIS — R600 Localized edema: Secondary | ICD-10-CM | POA: Diagnosis not present

## 2023-12-19 DIAGNOSIS — F0393 Unspecified dementia, unspecified severity, with mood disturbance: Secondary | ICD-10-CM | POA: Insufficient documentation

## 2023-12-19 DIAGNOSIS — I824Y2 Acute embolism and thrombosis of unspecified deep veins of left proximal lower extremity: Secondary | ICD-10-CM

## 2023-12-19 DIAGNOSIS — Z86718 Personal history of other venous thrombosis and embolism: Secondary | ICD-10-CM | POA: Insufficient documentation

## 2023-12-19 DIAGNOSIS — Z882 Allergy status to sulfonamides status: Secondary | ICD-10-CM | POA: Diagnosis not present

## 2023-12-19 DIAGNOSIS — Z880 Allergy status to penicillin: Secondary | ICD-10-CM | POA: Diagnosis not present

## 2023-12-19 DIAGNOSIS — Z90710 Acquired absence of both cervix and uterus: Secondary | ICD-10-CM | POA: Insufficient documentation

## 2023-12-19 DIAGNOSIS — Z79899 Other long term (current) drug therapy: Secondary | ICD-10-CM | POA: Diagnosis not present

## 2023-12-19 DIAGNOSIS — M797 Fibromyalgia: Secondary | ICD-10-CM | POA: Insufficient documentation

## 2023-12-19 DIAGNOSIS — Z888 Allergy status to other drugs, medicaments and biological substances status: Secondary | ICD-10-CM | POA: Diagnosis not present

## 2023-12-19 DIAGNOSIS — Z7901 Long term (current) use of anticoagulants: Secondary | ICD-10-CM | POA: Insufficient documentation

## 2023-12-19 DIAGNOSIS — F0394 Unspecified dementia, unspecified severity, with anxiety: Secondary | ICD-10-CM | POA: Diagnosis not present

## 2023-12-19 DIAGNOSIS — M1990 Unspecified osteoarthritis, unspecified site: Secondary | ICD-10-CM | POA: Insufficient documentation

## 2023-12-19 DIAGNOSIS — Z87442 Personal history of urinary calculi: Secondary | ICD-10-CM | POA: Insufficient documentation

## 2023-12-19 DIAGNOSIS — R5383 Other fatigue: Secondary | ICD-10-CM | POA: Diagnosis not present

## 2023-12-19 DIAGNOSIS — Z83719 Family history of colon polyps, unspecified: Secondary | ICD-10-CM | POA: Insufficient documentation

## 2023-12-19 LAB — CBC WITH DIFFERENTIAL/PLATELET
Abs Immature Granulocytes: 0.02 K/uL (ref 0.00–0.07)
Basophils Absolute: 0.1 K/uL (ref 0.0–0.1)
Basophils Relative: 1 %
Eosinophils Absolute: 0.6 K/uL — ABNORMAL HIGH (ref 0.0–0.5)
Eosinophils Relative: 7 %
HCT: 38.3 % (ref 36.0–46.0)
Hemoglobin: 12.4 g/dL (ref 12.0–15.0)
Immature Granulocytes: 0 %
Lymphocytes Relative: 23 %
Lymphs Abs: 1.9 K/uL (ref 0.7–4.0)
MCH: 32 pg (ref 26.0–34.0)
MCHC: 32.4 g/dL (ref 30.0–36.0)
MCV: 99 fL (ref 80.0–100.0)
Monocytes Absolute: 0.9 K/uL (ref 0.1–1.0)
Monocytes Relative: 10 %
Neutro Abs: 5 K/uL (ref 1.7–7.7)
Neutrophils Relative %: 59 %
Platelets: 361 K/uL (ref 150–400)
RBC: 3.87 MIL/uL (ref 3.87–5.11)
RDW: 13.8 % (ref 11.5–15.5)
WBC: 8.4 K/uL (ref 4.0–10.5)
nRBC: 0 % (ref 0.0–0.2)

## 2023-12-19 LAB — IRON AND TIBC
Iron: 58 ug/dL (ref 28–170)
Saturation Ratios: 18 % (ref 10.4–31.8)
TIBC: 319 ug/dL (ref 250–450)
UIBC: 261 ug/dL

## 2023-12-19 LAB — FERRITIN: Ferritin: 76 ng/mL (ref 11–307)

## 2023-12-19 NOTE — Patient Instructions (Signed)
 [facility] **VISIT SUMMARY & IMPORTANT INSTRUCTIONS **   You were seen today by Pleasant Barefoot PA-C for your anemia and blood clot.    BLOOD CLOT: Your blood clot has gone away, but you remain at high risk for having more blood clots in the future. Continue Eliquis  (apixaban ) 2.5 mg twice daily to reduce risk of future blood clots.  ANEMIA Your blood and iron levels look much better! We will schedule you for 1 dose of IV iron. Continue taking iron supplement daily.  FOLLOW-UP APPOINTMENT: 6 months  ** Thank you for trusting me with your healthcare!  I strive to provide all of my patients with quality care at each visit.  If you receive a survey for this visit, I would be so grateful to you for taking the time to provide feedback.  Thank you in advance!  ~ Asianae Minkler                   Dr. Alean Stands   &   Pleasant Barefoot, PA-C   - - - - - - - - - - - - - - - - - -    Thank you for choosing [facility] to provide your oncology and hematology care.  To afford each patient quality time with our provider, please arrive at least 15 minutes before your scheduled appointment time.   If you have a lab appointment with the Cancer Center please come in thru the Main Entrance and check in at the main information desk.  You need to re-schedule your appointment should you arrive 10 or more minutes late.  We strive to give you quality time with our providers, and arriving late affects you and other patients whose appointments are after yours.  Also, if you no show three or more times for appointments you may be dismissed from the clinic at the providers discretion.     Again, thank you for choosing [facility]  Our hope is that these requests will decrease the amount of time that you wait before being seen by our physicians.       _____________________________________________________________  Should  you have questions after your visit to [facility], please contact our office at [phone] and follow the prompts.  Our office hours are 8:00 a.m. and 4:30 p.m. Monday - Friday.  Please note that voicemails left after 4:00 p.m. may not be returned until the following business day.  We are closed weekends and major holidays.  You do have access to a nurse 24-7, just call the main number to the clinic [phone] and do not press any options, hold on the line and a nurse will answer the phone.    For prescription refill requests, have your pharmacy contact our office and allow 72 hours.

## 2023-12-25 ENCOUNTER — Inpatient Hospital Stay

## 2023-12-25 ENCOUNTER — Encounter (HOSPITAL_COMMUNITY): Payer: Self-pay

## 2023-12-25 VITALS — BP 124/60 | HR 76 | Temp 98.6°F | Resp 18

## 2023-12-25 DIAGNOSIS — D500 Iron deficiency anemia secondary to blood loss (chronic): Secondary | ICD-10-CM

## 2023-12-25 DIAGNOSIS — D509 Iron deficiency anemia, unspecified: Secondary | ICD-10-CM | POA: Diagnosis not present

## 2023-12-25 MED ORDER — FAMOTIDINE IN NACL 20-0.9 MG/50ML-% IV SOLN
20.0000 mg | Freq: Once | INTRAVENOUS | Status: AC
  Administered 2023-12-25: 20 mg via INTRAVENOUS
  Filled 2023-12-25: qty 50

## 2023-12-25 MED ORDER — ACETAMINOPHEN 325 MG PO TABS
650.0000 mg | ORAL_TABLET | Freq: Once | ORAL | Status: AC
  Administered 2023-12-25: 650 mg via ORAL
  Filled 2023-12-25: qty 2

## 2023-12-25 MED ORDER — SODIUM CHLORIDE 0.9 % IV SOLN
Freq: Once | INTRAVENOUS | Status: AC
Start: 2023-12-25 — End: 2023-12-25

## 2023-12-25 MED ORDER — SODIUM CHLORIDE 0.9 % IV SOLN
510.0000 mg | Freq: Once | INTRAVENOUS | Status: AC
  Administered 2023-12-25: 510 mg via INTRAVENOUS
  Filled 2023-12-25: qty 17

## 2023-12-25 MED ORDER — FAMOTIDINE IN NACL 20-0.9 MG/50ML-% IV SOLN
20.0000 mg | Freq: Once | INTRAVENOUS | Status: DC | PRN
Start: 2023-12-25 — End: 2023-12-25

## 2023-12-25 MED ORDER — METHYLPREDNISOLONE SODIUM SUCC 125 MG IJ SOLR
125.0000 mg | Freq: Once | INTRAMUSCULAR | Status: AC | PRN
Start: 2023-12-25 — End: 2023-12-25
  Administered 2023-12-25: 125 mg via INTRAVENOUS

## 2023-12-25 MED ORDER — ALBUTEROL SULFATE HFA 108 (90 BASE) MCG/ACT IN AERS
2.0000 | INHALATION_SPRAY | Freq: Once | RESPIRATORY_TRACT | Status: DC | PRN
Start: 2023-12-25 — End: 2023-12-25

## 2023-12-25 MED ORDER — CETIRIZINE HCL 10 MG PO TABS
10.0000 mg | ORAL_TABLET | Freq: Once | ORAL | Status: AC
  Administered 2023-12-25: 10 mg via ORAL
  Filled 2023-12-25: qty 1

## 2023-12-25 NOTE — Progress Notes (Signed)
Patient presents today for iron infusion.  Patient is in satisfactory condition with no new complaints voiced.  Vital signs are stable.  We will proceed with infusion per provider orders.    Peripheral IV started with good blood return 

## 2023-12-25 NOTE — Progress Notes (Signed)
 Hypersensitivity Reaction note  Date of event: 12/25/23 Time of event: 1000 Generic name of drug involved: Ferumosytol Name of provider notified of the hypersensitivity reaction: Dr. Davonna Was agent that likely caused hypersensitivity reaction added to Allergies List within EMR? yes Chain of events including reaction signs/symptoms, treatment administered, and outcome (e.g., drug resumed; drug discontinued; sent to Emergency Department; etc.) see progress note below.   Edgar Reisz, Dorothyann Hasty, RN 12/25/2023 11:19 AM   Feraheme  started at 0953, patient called out at 1000 and complained of chest tightness and dizziness. 1001 Feraheme  stop and disconnected from pt, started new bag of saline. Contacted Dr. Davonna, Md in room to assess. 1003 patient states chest tightness is better. Vitals stable, will give pepcid  and solumedrol per MD orders per hypersensitivity protocol. 1013. Patient states she feels better. Patient is eating crackers and drinking soda. Will observe for 30 min and re-evaluate per MD.   1048-Patient is requesting to leave  I'm ready to leave MD made aware, vitals stable and MD ok with being discharged. No more iron today per MD. Patient is scheduled to see provider in January for labs and office visit. Patient discharged back to the facility with her sitter in stable condition.

## 2023-12-25 NOTE — Patient Instructions (Signed)
 [facility] [place] - A DEPT OF [facility]  Discharge Instructions: Thank you for choosing [facility] to provide your oncology and hematology care.  If you have a lab appointment with the Cancer Center - please note that after April 8th, 2024, all labs will be drawn in the cancer center.  You do not have to check in or register with the main entrance as you have in the past but will complete your check-in in the cancer center.  Wear comfortable clothing and clothing appropriate for easy access to any Portacath or PICC line.   We strive to give you quality time with your provider. You may need to reschedule your appointment if you arrive late (15 or more minutes).  Arriving late affects you and other patients whose appointments are after yours.  Also, if you miss three or more appointments without notifying the office, you may be dismissed from the clinic at the provider's discretion.      For prescription refill requests, have your pharmacy contact our office and allow 72 hours for refills to be completed.    Today you received some of your iron (feraheme ) today before reacting.    To help prevent nausea and vomiting after your treatment, we encourage you to take your nausea medication as directed.  BELOW ARE SYMPTOMS THAT SHOULD BE REPORTED IMMEDIATELY: *FEVER GREATER THAN 100.4 F (38 C) OR HIGHER *CHILLS OR SWEATING *NAUSEA AND VOMITING THAT IS NOT CONTROLLED WITH YOUR NAUSEA MEDICATION *UNUSUAL SHORTNESS OF BREATH *UNUSUAL BRUISING OR BLEEDING *URINARY PROBLEMS (pain or burning when urinating, or frequent urination) *BOWEL PROBLEMS (unusual diarrhea, constipation, pain near the anus) TENDERNESS IN MOUTH AND THROAT WITH OR WITHOUT PRESENCE OF ULCERS (sore throat, sores in mouth, or a toothache) UNUSUAL RASH, SWELLING OR PAIN  UNUSUAL VAGINAL DISCHARGE OR ITCHING   Items with * indicate a potential emergency and should be followed up as soon as  possible or go to the Emergency Department if any problems should occur.  Please show the CHEMOTHERAPY ALERT CARD or IMMUNOTHERAPY ALERT CARD at check-in to the Emergency Department and triage nurse.  Should you have questions after your visit or need to cancel or reschedule your appointment, please contact [facility] [place] - A DEPT OF [facility] [phone]  and follow the prompts.  Office hours are 8:00 a.m. to 4:30 p.m. Monday - Friday. Please note that voicemails left after 4:00 p.m. may not be returned until the following business day.  We are closed weekends and major holidays. You have access to a nurse at all times for urgent questions. Please call the main number to the clinic [phone] and follow the prompts.  For any non-urgent questions, you may also contact your provider using MyChart. We now offer e-Visits for anyone 74 and older to request care online for non-urgent symptoms. For details visit [email or web address].   Also download the MyChart app! Go to the app store, search MyChart, open the app, select [place], and log in with your MyChart username and password.

## 2024-02-27 ENCOUNTER — Encounter: Payer: Self-pay | Admitting: Internal Medicine

## 2024-02-27 ENCOUNTER — Encounter: Payer: Self-pay | Admitting: Nurse Practitioner

## 2024-05-12 ENCOUNTER — Encounter: Payer: Self-pay | Admitting: Oncology

## 2024-06-04 ENCOUNTER — Encounter: Payer: Self-pay | Admitting: Oncology

## 2024-06-11 ENCOUNTER — Inpatient Hospital Stay: Attending: Hematology

## 2024-06-11 DIAGNOSIS — Z83719 Family history of colon polyps, unspecified: Secondary | ICD-10-CM | POA: Insufficient documentation

## 2024-06-11 DIAGNOSIS — Z8719 Personal history of other diseases of the digestive system: Secondary | ICD-10-CM | POA: Insufficient documentation

## 2024-06-11 DIAGNOSIS — Z79890 Hormone replacement therapy (postmenopausal): Secondary | ICD-10-CM | POA: Insufficient documentation

## 2024-06-11 DIAGNOSIS — Z7901 Long term (current) use of anticoagulants: Secondary | ICD-10-CM | POA: Insufficient documentation

## 2024-06-11 DIAGNOSIS — K219 Gastro-esophageal reflux disease without esophagitis: Secondary | ICD-10-CM | POA: Insufficient documentation

## 2024-06-11 DIAGNOSIS — F0393 Unspecified dementia, unspecified severity, with mood disturbance: Secondary | ICD-10-CM | POA: Insufficient documentation

## 2024-06-11 DIAGNOSIS — E039 Hypothyroidism, unspecified: Secondary | ICD-10-CM | POA: Insufficient documentation

## 2024-06-11 DIAGNOSIS — Z882 Allergy status to sulfonamides status: Secondary | ICD-10-CM | POA: Insufficient documentation

## 2024-06-11 DIAGNOSIS — Z881 Allergy status to other antibiotic agents status: Secondary | ICD-10-CM | POA: Insufficient documentation

## 2024-06-11 DIAGNOSIS — K589 Irritable bowel syndrome without diarrhea: Secondary | ICD-10-CM | POA: Insufficient documentation

## 2024-06-11 DIAGNOSIS — Z9049 Acquired absence of other specified parts of digestive tract: Secondary | ICD-10-CM | POA: Insufficient documentation

## 2024-06-11 DIAGNOSIS — Z90710 Acquired absence of both cervix and uterus: Secondary | ICD-10-CM | POA: Insufficient documentation

## 2024-06-11 DIAGNOSIS — Z880 Allergy status to penicillin: Secondary | ICD-10-CM | POA: Insufficient documentation

## 2024-06-11 DIAGNOSIS — Z888 Allergy status to other drugs, medicaments and biological substances status: Secondary | ICD-10-CM | POA: Insufficient documentation

## 2024-06-11 DIAGNOSIS — Z87442 Personal history of urinary calculi: Secondary | ICD-10-CM | POA: Insufficient documentation

## 2024-06-11 DIAGNOSIS — M797 Fibromyalgia: Secondary | ICD-10-CM | POA: Insufficient documentation

## 2024-06-11 DIAGNOSIS — Z8672 Personal history of thrombophlebitis: Secondary | ICD-10-CM | POA: Insufficient documentation

## 2024-06-11 DIAGNOSIS — Z79899 Other long term (current) drug therapy: Secondary | ICD-10-CM | POA: Insufficient documentation

## 2024-06-11 DIAGNOSIS — D509 Iron deficiency anemia, unspecified: Secondary | ICD-10-CM | POA: Insufficient documentation

## 2024-06-11 DIAGNOSIS — M1990 Unspecified osteoarthritis, unspecified site: Secondary | ICD-10-CM | POA: Insufficient documentation

## 2024-06-11 DIAGNOSIS — Z860100 Personal history of colon polyps, unspecified: Secondary | ICD-10-CM | POA: Insufficient documentation

## 2024-06-17 LAB — LAB REPORT - SCANNED: TSH: 3.68

## 2024-06-17 NOTE — Progress Notes (Unsigned)
 Contacted SNF at about 9:30 am to return call to [facility] cancer center about if patient had labs done and if not could she arrive an hour early for appointment tomorrow. Joy stated she would give patients nurse the message and have nurse call back.   Per Wheeler Senters, RN SNF will draw labs and fax results to clinic.

## 2024-06-17 NOTE — Progress Notes (Unsigned)
 "  [facility] [address], [place] [address]   CLINIC:  Medical Oncology/Hematology  PCP:  Pura Lenis, MD [address] [place] [address] [phone]   REASON FOR VISIT:  Follow-up for iron deficiency anemia and left lower extremity DVT  CURRENT THERAPY: Eliquis  + IV Feraheme    INTERVAL HISTORY:   Stefanie [name] 84 y.o. female returns for routine follow-up of her iron deficiency anemia and left lower extremity DVT.  She was last seen by Pleasant Barefoot PA-C on 12/19/2023.   ***She is accompanied today by a staff member from SNF.  She received IV Feraheme  on 12/25/2023.  Although she had previously received this without issue she had mild hypersensitivity reaction with chest tightness and dizziness.  Symptoms resolved with IV Pepcid  and Solu-Medrol .***  At today's visit, she reports feeling fairly well, although she is a difficult historian due to her underlying dementia.*** She has 75***% energy and 75***% appetite.  ***She endorses that she is maintaining a stable weight.  Denies any abnormal fatigue, headaches, pica, lightheadedness, syncope, chest pain, difficulty breathing.*** Patient continues to deny any rectal bleeding, melena, or other signs of major bleeding.*** Her left leg edema has improved, but she has some lingering left leg pain.*** She has not had any falls since she has been at [facility].*** She continues to take iron tablet once daily.*** She is taking Eliquis  2.5 mg twice daily.***  ASSESSMENT & PLAN:  1.  Recurrent LLE DVT - Initial episode of left leg superficial venous thrombophlebitis in January 2024, treated with Eliquis  through April 2024. 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 - 10/22/2022-10/27/2022 patient admitted to the hospital with new acute left lower extremity DVT.  Placed on back on Eliquis  as of 10/26/2022. IVC placement on 10/24/2022 due to concern for concurrent GI bleeding.  IR recommended a full 6-12 weeks of anticoagulation before stopping therapy for any reason - Concern regarding chronic anticoagulation due to fall risk and suspected GI blood loss.  Per risk/benefit discussion with family during hospitalization in May 2024, family opted for continued anticoagulation with acceptance of associated risks. - US  venous left lower leg (02/22/2023): No evidence of LLE DVT.  Previously noted left popliteal, posterior tibial, and peroneal vein thrombus has completely resolved. - IVC filter retrieval via IR on 09/15/2023 - Coagulopathy panel (02/22/2023) NEGATIVE: Lupus anticoagulant, anticardiolipin, beta-2  glycoprotein 1 antibodies are NEGATIVE.  No PT gene mutation or factor V Leiden. - Patient's brother also had recurrent DVTs - SVT/DVT weakly provoked due to decreased mobility (ambulating with walker) and boot on left leg due to recent Achilles tendon injury - She continues to have ongoing risk factors including limited mobility*** - She remains at risk for falls, but denies any fall within the past 6 months.***  She denies any obvious rectal bleeding or melena.*** - PLAN: Due to ongoing risk factors for recurrent DVT, recommend low-dose Eliquis  2.5 mg twice daily   2.  Iron deficiency anemia - Hgb 10.2 on 07/01/2022 (prior to starting Eliquis  for initial episode of superficial thrombophlebitis) - During hospitalization in April 2024, patient had heme positive stool,  Hgb down to 7.9.  EGD from 10/01/2022 showed gastritis, but limited evaluation due to large amount of food residue in stomach/duodenum. - During  hospitalization in May 2024, Hgb dropped to 6.5, and she was transfused 1 unit PRBC on 10/24/2022.  Family opted to continue anticoagulation after risk/benefit discussion with hospitalist. - Following with [facility] (Dr. Albertus / Delon Failing, PA-C) - patient declined further evaluation with EGD/colonoscopy, but this could be reconsidered in the future if able to hold Eliquis  and continuing to show signs of GI blood loss. - Patient denies any rectal bleeding or melena. *** - Most recent IV Feraheme  on 12/25/2023.  Although she had previously received this without issue she had MILD HYPERSENSITIVITY REACTION with chest tightness and dizziness.  Symptoms resolved with IV Pepcid  and Solu-Medrol .*** - Labs from SNF *** - PLAN: *** TBD.  *** Recommend IV Feraheme  x 1 due to ferritin < 100 with ongoing bleeding risks *** premedication with Feraheme ??  *** - Continue daily iron supplement - Recheck CBC and iron panel with RTC in 6 months - Continue gastroenterology follow-up with [facility]   3.  Other history - PMH: IBS, GERD, hypothyroidism, fibromyalgia, anxiety/depression, arthritis - SOCIAL: Patient is widowed.  She was living at home alone prior to hospitalization in May 2024.  She currently resides at [facility] for acute rehab.  She denies any alcohol, tobacco, or illicit substance use. - FAMILY: Younger brother has had blood clots, but she does not know the details.  She denies any family history of cancer.  She had one daughter, who passed away at age 57 due to brain aneurysm.   PLAN SUMMARY: *** TBD *** >> IV Feraheme  x 1 >> Labs in 6 months = CBC/D, ferritin, iron/TIBC >> OFFICE visit in 6 months (after labs)      REVIEW OF SYSTEMS:***  Review of Systems  Constitutional:  Positive for fatigue (waxes and wanes). Negative for appetite change, chills, diaphoresis, fever and unexpected weight change.  HENT:   Negative for lump/mass and nosebleeds.   Eyes:  Negative for eye  problems.  Respiratory:  Negative for cough, hemoptysis and shortness of breath.   Cardiovascular:  Negative for chest pain, leg swelling and palpitations.  Gastrointestinal:  Negative for abdominal pain, blood in stool, constipation, diarrhea, nausea and vomiting.  Genitourinary:  Negative for hematuria.   Musculoskeletal:  Positive for arthralgias.  Skin:  Negative for itching.  Neurological:  Negative for dizziness, headaches and light-headedness.  Hematological:  Does not bruise/bleed easily.  Psychiatric/Behavioral:  Negative for depression.      PHYSICAL EXAM:***  ECOG PERFORMANCE STATUS: 3 - Symptomatic, >50% confined to bed /wheelchair dependent  There were no vitals filed for this visit.  Physical Exam Constitutional:      Appearance: Normal appearance.     Comments: Presents in wheelchair  Cardiovascular:     Rate and Rhythm: Normal rate and regular rhythm.     Pulses: Normal pulses.     Heart sounds: Normal heart sounds.  Pulmonary:     Effort: Pulmonary effort is normal.     Breath sounds: Normal breath sounds.  Musculoskeletal:        General: No swelling.     Right lower leg: No edema.     Left lower leg: No edema.  Skin:    General: Skin is warm and dry.  Neurological:     General: No focal deficit present.     Mental Status: She is alert and oriented to  person, place, and time.  Psychiatric:        Mood and Affect: Mood normal.        Behavior: Behavior normal.        Cognition and Memory: She exhibits impaired recent memory.    PAST MEDICAL/SURGICAL HISTORY:  Past Medical History:  Diagnosis Date   Anxiety    Arthritis    Colon polyps    Depression    Diverticulosis of colon (without mention of hemorrhage)    Fibromyalgia    GERD (gastroesophageal reflux disease)    Hiatal hernia    History of IBS    History of kidney stones    Hypothyroidism    Iron deficiency anemia due to chronic blood loss 11/15/2022   Past Surgical History:  Procedure  Laterality Date   ABDOMINAL HYSTERECTOMY     ANKLE SURGERY     bilateral    APPENDECTOMY  1985   BREAST BIOPSY Left    Benign    CHOLECYSTECTOMY  1985   ESOPHAGEAL MANOMETRY N/A 10/22/2012   Procedure: ESOPHAGEAL MANOMETRY (EM);  Surgeon: Alm JONELLE Gander, MD;  Location: [facility] END[facility];  Service: Endoscopy;  Laterality: N/A;   ESOPHAGOGASTRODUODENOSCOPY N/A 10/01/2022   Procedure: ESOPHAGOGASTRODUODENOSCOPY (EGD);  Surgeon: Eda Iha, MD;  Location: [facility] ENDOSCOPY;  Service: Gastroenterology;  Laterality: N/A;   EXTRACORPOREAL SHOCK WAVE LITHOTRIPSY Left 11/26/2018   Procedure: EXTRACORPOREAL SHOCK WAVE LITHOTRIPSY (ESWL);  Surgeon: Matilda Senior, MD;  Location: [facility] ORS;  Service: Urology;  Laterality: Left;   HEMORRHOID SURGERY     IR IVC FILTER PLMT / S&I /IMG GUID/MOD SED  10/24/2022   IR IVC FILTER RETRIEVAL / S&I /IMG GUID/MOD SED  09/15/2023   IR RADIOLOGIST EVAL & MGMT  03/15/2023   RECTOCELE REPAIR     TUBAL LIGATION      SOCIAL HISTORY:  Social History   Socioeconomic History   Marital status: Widowed    Spouse name: Not on file   Number of children: 1   Years of education: Not on file   Highest education level: Not on file  Occupational History   Occupation: retired    Associate Professor: RETIRED  Tobacco Use   Smoking status: Never   Smokeless tobacco: Never  Vaping Use   Vaping status: Never Used  Substance and Sexual Activity   Alcohol use: No    Alcohol/week: 0.0 standard drinks of alcohol   Drug use: No   Sexual activity: Not on file  Other Topics Concern   Not on file  Social History Narrative   Not on file   Social Drivers of Health   Tobacco Use: Low Risk (09/15/2023)   Patient History    Smoking Tobacco Use: Never    Smokeless Tobacco Use: Never    Passive Exposure: Not on file  Financial Resource Strain: Low Risk (07/19/2022)   Received from [facility]   Overall Financial Resource Strain (CARDIA)    Difficulty of Paying Living Expenses: Not  hard at all  Food Insecurity: Food Insecurity Present (10/22/2022)   Hunger Vital Sign    Worried About Running Out of Food in the Last Year: Sometimes true    Ran Out of Food in the Last Year: Sometimes true  Transportation Needs: No Transportation Needs (10/22/2022)   PRAPARE - Administrator, Civil Service (Medical): No    Lack of Transportation (Non-Medical): No  Physical Activity: Unknown (07/19/2022)   [facility]   Exercise Vital Sign    On  average, how many days per week do you engage in moderate to strenuous exercise (like a brisk walk)?: 0 days    Minutes of Exercise per Session: Not on file  Stress: Stress Concern Present (07/19/2022)   Received from [facility] - Occupational Stress Questionnaire    Feeling of Stress : To some extent  Social Connections: Moderately Integrated (07/19/2022)   Received from [facility]   Social Network    How would you rate your social network (family, work, friends)?: Adequate participation with social networks  Intimate Partner Violence: Not At Risk (10/22/2022)   Humiliation, Afraid, Rape, and Kick questionnaire    Fear of Current or Ex-Partner: No    Emotionally Abused: No    Physically Abused: No    Sexually Abused: No  Depression (PHQ2-9): Low Risk (12/25/2023)   Depression (PHQ2-9)    PHQ-2 Score: 0  Alcohol Screen: Not on file  Housing: Low Risk (10/22/2022)   Housing    Last Housing Risk Score: 0  Utilities: Not At Risk (10/22/2022)   AHC Utilities    Threatened with loss of utilities: No  Health Literacy: Not on file    FAMILY HISTORY:  Family History  Problem Relation Age of Onset   Colon polyps Brother    Breast cancer Neg Hx     CURRENT MEDICATIONS:  Outpatient Encounter Medications as of 06/18/2024  Medication Sig   acetaminophen  (TYLENOL ) 325 MG tablet Take 2 tablets (650 mg total) by mouth every 6 (six) hours as needed for mild pain or fever.    apixaban  (ELIQUIS ) 2.5 MG TABS tablet Take 1 tablet (2.5 mg total) by mouth 2 (two) times daily.   escitalopram  (LEXAPRO ) 20 MG tablet Take 20 mg by mouth daily.   ferrous sulfate  325 (65 FE) MG EC tablet Take 1 tablet (325 mg total) by mouth daily with breakfast.   fexofenadine  (ALLEGRA ) 60 MG tablet Take 1 tablet (60 mg total) by mouth 2 (two) times daily as needed for allergies or rhinitis.   lamoTRIgine (LAMICTAL) 100 MG tablet Take 100 mg by mouth daily.   levothyroxine  (SYNTHROID , LEVOTHROID) 75 MCG tablet Take 75 mcg by mouth at bedtime.   pantoprazole  (PROTONIX ) 40 MG tablet Take 1 tablet (40 mg total) by mouth daily.   polyethylene glycol (MIRALAX  / GLYCOLAX ) 17 g packet Take 17 g by mouth daily as needed.   potassium chloride  (KLOR-CON ) 10 MEQ tablet Take 10 mEq by mouth daily.   traZODone  (DESYREL ) 50 MG tablet Take 1 tablet (50 mg total) by mouth at bedtime as needed for sleep.   Vitamin D, Ergocalciferol, (DRISDOL) 1.25 MG (50000 UNIT) CAPS capsule Take 50,000 Units by mouth every 7 (seven) days.   No facility-administered encounter medications on file as of 06/18/2024.    ALLERGIES:  Allergies  Allergen Reactions   Feraheme  [Ferumoxytol ] Other (See Comments)    Chest tightness, dizzy, face was flushed. Medicated with famotidine  and solumedrol. Did not complete infusion. See progress note from 12/25/2023.   Gabapentin      Dizziness. States made her feel like she was drunk. Unsteady gait and dizziness.         Lidocaine  Rash   Metoclopramide Rash   Other Other (See Comments)    DYE+Nitrofurantoin+Brilliant Blue Fcf   Sulfa Antibiotics Rash   Z-Pak [Azithromycin] Rash   Metoclopramide Hcl Other (See Comments)    REACTION: a drawing of all her muscles   Penicillins Other (See Comments)  Unknown    Procaine Hcl Other (See Comments)    Unknown    Macrodantin [Nitrofurantoin Macrocrystal] Rash    LABORATORY DATA:  I have reviewed the labs as listed.  CBC     Component Value Date/Time   WBC 8.4 12/19/2023 0742   RBC 3.87 12/19/2023 0742   HGB 12.4 12/19/2023 0742   HCT 38.3 12/19/2023 0742   PLT 361 12/19/2023 0742   MCV 99.0 12/19/2023 0742   MCH 32.0 12/19/2023 0742   MCHC 32.4 12/19/2023 0742   RDW 13.8 12/19/2023 0742   LYMPHSABS 1.9 12/19/2023 0742   MONOABS 0.9 12/19/2023 0742   EOSABS 0.6 (H) 12/19/2023 0742   BASOSABS 0.1 12/19/2023 0742      Latest Ref Rng & Units 12/06/2022    4:03 AM 12/03/2022    5:18 PM 10/31/2022    8:00 AM  CMP  Glucose 70 - 99 mg/dL 890  882  891   BUN 8 - 23 mg/dL 10  12  18    Creatinine 0.44 - 1.00 mg/dL 9.33  9.39  9.39   Sodium 135 - 145 mmol/L 138  136  134   Potassium 3.5 - 5.1 mmol/L 3.8  3.4  3.7   Chloride 98 - 111 mmol/L 102  103  99   CO2 22 - 32 mmol/L 28  26  24    Calcium 8.9 - 10.3 mg/dL 8.2  7.9  8.5     DIAGNOSTIC IMAGING:  I have independently reviewed the relevant imaging and discussed with the patient.   WRAP UP:  All questions were answered. The patient knows to call the clinic with any problems, questions or concerns.  Medical decision making: Moderate***  Time spent on visit: I spent 20 minutes counseling the patient face to face. The total time spent in the appointment was 30 minutes and more than 50% was on counseling.  Pleasant CHRISTELLA Barefoot, PA-C  *** "

## 2024-06-18 ENCOUNTER — Inpatient Hospital Stay: Admitting: Physician Assistant

## 2024-06-20 NOTE — Progress Notes (Signed)
 "  [facility] [address], [place] [address]   CLINIC:  Medical Oncology/Hematology  PCP:  Stefanie Lenis, MD [address] [place] [address] [phone]   REASON FOR VISIT:  Follow-up for iron deficiency anemia and left lower extremity DVT  CURRENT THERAPY: Eliquis  + IV Feraheme    INTERVAL HISTORY:   [name] 84 y.o. female returns for routine follow-up of her iron deficiency anemia and left lower extremity DVT.  She was last seen by Stefanie [name] on 12/19/2023.   She is accompanied today by a staff member from SNF.  She received IV Feraheme  on 12/25/2023.  Although she had previously received this without issue she had mild hypersensitivity reaction with chest tightness and dizziness.  Symptoms resolved with IV Pepcid  and Solu-Medrol .  At today's visit, she reports feeling fairly well, although she is a difficult historian due to her underlying dementia. She has 75% energy and 75% appetite.   Denies any abnormal fatigue, headaches, pica, lightheadedness, syncope, chest pain, difficulty breathing. Patient continues to deny any rectal bleeding, melena, or other signs of major bleeding. Her left leg edema has improved, but she has some lingering left leg pain. She has not had any falls since she has been at [facility]. She continues to take iron tablet once daily. She is taking Eliquis  2.5 mg twice daily.  ASSESSMENT & PLAN:  1.  Recurrent LLE DVT - Initial episode of left leg superficial venous thrombophlebitis in January 2024, treated with Eliquis  through April 2024. - Venous US  left leg (08/24/2022): No evidence of acute or chronic DVT or SVT in left leg - Venous US  (09/29/2022): Nonocclusive DVT in left femoral vein - Venous US  (09/30/2022): Chronic DVT of left gastrocnemius veins and findings consistent with chronic superficial thrombosis in the left small saphenous vein - Venous US  (10/22/2022) showed new acute occlusive DVT seen  in left popliteal, posterior tibial, and peroneal veins. - 10/22/2022-10/27/2022 patient admitted to the hospital with new acute left lower extremity DVT.  Placed on back on Eliquis  as of 10/26/2022. IVC placement on 10/24/2022 due to concern for concurrent [name] bleeding.  IR recommended a full 6-12 weeks of anticoagulation before stopping therapy for any reason - Concern regarding chronic anticoagulation due to fall risk and suspected [name] blood loss.  Per risk/benefit discussion with family during hospitalization in May 2024, family opted for continued anticoagulation with acceptance of associated risks. - US  venous left lower leg (02/22/2023): No evidence of LLE DVT.  Previously noted left popliteal, posterior tibial, and peroneal vein thrombus has completely resolved. - IVC filter retrieval via IR on 09/15/2023 - Coagulopathy panel (02/22/2023) NEGATIVE: Lupus anticoagulant, anticardiolipin, beta-2  glycoprotein 1 antibodies are NEGATIVE.  No PT gene mutation or factor V Leiden. - Patient's brother also had recurrent DVTs - SVT/DVT weakly provoked due to decreased mobility (ambulating with walker) and boot on left leg due to recent Achilles tendon injury - She continues to have ongoing risk factors including limited mobility - She remains at risk for falls, but denies any fall within the past 6 months.  She denies any obvious rectal bleeding or melena. - PLAN: Due to ongoing risk factors for recurrent DVT, recommend low-dose Eliquis  2.5 mg twice daily   2.  Iron deficiency anemia - Hgb 10.2 on 07/01/2022 (prior to starting Eliquis  for initial episode of superficial thrombophlebitis) - During hospitalization in April 2024, patient had heme positive stool, Hgb down to 7.9.  EGD from 10/01/2022 showed  gastritis, but limited evaluation due to large amount of food residue in stomach/duodenum. - During hospitalization in May 2024, Hgb dropped to 6.5, and she was transfused 1 unit PRBC on 10/24/2022.  Family opted to  continue anticoagulation after risk/benefit discussion with hospitalist. - Following with [facility] (Dr. Albertus / Delon Failing, [name]) - patient declined further evaluation with EGD/colonoscopy, but this could be reconsidered in the future if able to hold Eliquis  and continuing to show signs of [name] blood loss. - Patient denies any rectal bleeding or melena.  - Most recent IV Feraheme  on 12/25/2023.  Although she had previously received this without issue she had MILD HYPERSENSITIVITY REACTION with chest tightness and dizziness.  Symptoms resolved with IV Pepcid  and Solu-Medrol . - Labs from SNF (06/17/2024): Hgb 13.3/MCV 94.2.  Ferritin 35, iron saturation 24% - PLAN:  Recommend IV Monoferric x 1 due to ferritin < 100 with ongoing bleeding risks.  We will add PREMEDICATION due to mild hypersensitivity reaction with Feraheme . - Continue daily iron supplement - Recheck CBC and iron panel with RTC in 6 months - Continue gastroenterology follow-up with Stefanie [name]   3.  Other history - PMH: IBS, GERD, hypothyroidism, fibromyalgia, anxiety/depression, arthritis - SOCIAL: Patient is widowed.  She was living at home alone prior to hospitalization in May 2024.  She currently resides at [facility] for acute rehab.  She denies any alcohol, tobacco, or illicit substance use. - FAMILY: Younger brother has had blood clots, but she does not know the details.  She denies any family history of cancer.  She had one daughter, who passed away at age 82 due to brain aneurysm.   PLAN SUMMARY:  >> IV Monoferric x 1 (with PREMEDS) >> Labs in 6 months = CBC/D, ferritin, iron/TIBC >> OFFICE visit in 6 months (after labs)      REVIEW OF SYSTEMS:  Review of Systems  Constitutional:  Positive for fatigue (waxes and wanes). Negative for appetite change, chills, diaphoresis, fever and unexpected weight change.  HENT:   Positive for trouble swallowing (working w/ speech therapy at [facility]). Negative for lump/mass and  nosebleeds.   Eyes:  Negative for eye problems.  Respiratory:  Negative for cough, hemoptysis and shortness of breath.   Cardiovascular:  Negative for chest pain, leg swelling and palpitations.  Gastrointestinal:  Positive for constipation. Negative for abdominal pain, blood in stool, diarrhea, nausea and vomiting.  Genitourinary:  Negative for hematuria.   Musculoskeletal:  Positive for arthralgias.  Skin:  Negative for itching.  Neurological:  Positive for headaches. Negative for dizziness and light-headedness.  Hematological:  Does not bruise/bleed easily.  Psychiatric/Behavioral:  Negative for depression.      PHYSICAL EXAM:  ECOG PERFORMANCE STATUS: 3 - Symptomatic, >50% confined to bed /wheelchair dependent  Vitals:   06/24/24 0837  BP: 130/76  Pulse: 80  Resp: 18  Temp: 98.1 F (36.7 C)  SpO2: 96%    Physical Exam Constitutional:      Appearance: Normal appearance.     Comments: Presents in wheelchair  Cardiovascular:     Rate and Rhythm: Normal rate and regular rhythm.     Pulses: Normal pulses.     Heart sounds: Normal heart sounds.  Pulmonary:     Effort: Pulmonary effort is normal.     Breath sounds: Normal breath sounds.  Musculoskeletal:        General: No swelling.     Right lower leg: No edema.     Left lower leg: No edema.  Skin:    General: Skin is warm and dry.  Neurological:     General: No focal deficit present.     Mental Status: She is alert and oriented to person, place, and time.  Psychiatric:        Mood and Affect: Mood normal.        Behavior: Behavior normal.        Cognition and Memory: She exhibits impaired recent memory.    PAST MEDICAL/SURGICAL HISTORY:  Past Medical History:  Diagnosis Date   Anxiety    Arthritis    Colon polyps    Depression    Diverticulosis of colon (without mention of hemorrhage)    Fibromyalgia    GERD (gastroesophageal reflux disease)    Hiatal hernia    History of IBS    History of kidney stones     Hypothyroidism    Iron deficiency anemia due to chronic blood loss 11/15/2022   Past Surgical History:  Procedure Laterality Date   ABDOMINAL HYSTERECTOMY     ANKLE SURGERY     bilateral    APPENDECTOMY  1985   BREAST BIOPSY Left    Benign    CHOLECYSTECTOMY  1985   ESOPHAGEAL MANOMETRY N/A 10/22/2012   Procedure: ESOPHAGEAL MANOMETRY (EM);  Surgeon: Alm JONELLE Gander, MD;  Location: [facility] ENDOSCOPY;  Service: Endoscopy;  Laterality: N/A;   ESOPHAGOGASTRODUODENOSCOPY N/A 10/01/2022   Procedure: ESOPHAGOGASTRODUODENOSCOPY (EGD);  Surgeon: Eda Iha, MD;  Location: [facility] ENDOSCOPY;  Service: Gastroenterology;  Laterality: N/A;   EXTRACORPOREAL SHOCK WAVE LITHOTRIPSY Left 11/26/2018   Procedure: EXTRACORPOREAL SHOCK WAVE LITHOTRIPSY (ESWL);  Surgeon: Matilda Senior, MD;  Location: [facility] ORS;  Service: Urology;  Laterality: Left;   HEMORRHOID SURGERY     IR IVC FILTER PLMT / S&I /IMG GUID/MOD SED  10/24/2022   IR IVC FILTER RETRIEVAL / S&I /IMG GUID/MOD SED  09/15/2023   IR RADIOLOGIST EVAL & MGMT  03/15/2023   RECTOCELE REPAIR     TUBAL LIGATION      SOCIAL HISTORY:  Social History   Socioeconomic History   Marital status: Widowed    Spouse name: Not on file   Number of children: 1   Years of education: Not on file   Highest education level: Not on file  Occupational History   Occupation: retired    Associate Professor: RETIRED  Tobacco Use   Smoking status: Never   Smokeless tobacco: Never  Vaping Use   Vaping status: Never Used  Substance and Sexual Activity   Alcohol use: No    Alcohol/week: 0.0 standard drinks of alcohol   Drug use: No   Sexual activity: Not on file  Other Topics Concern   Not on file  Social History Narrative   Not on file   Social Drivers of Health   Tobacco Use: Low Risk (09/15/2023)   Patient History    Smoking Tobacco Use: Never    Smokeless Tobacco Use: Never    Passive Exposure: Not on file  Financial Resource Strain: Low Risk (07/19/2022)    Received from [facility]   Overall Financial Resource Strain (CARDIA)    Difficulty of Paying Living Expenses: Not hard at all  Food Insecurity: Food Insecurity Present (10/22/2022)   Hunger Vital Sign    Worried About Running Out of Food in the Last Year: Sometimes true    Ran Out of Food in the Last Year: Sometimes true  Transportation Needs: No Transportation Needs (10/22/2022)   PRAPARE - Transportation  Lack of Transportation (Medical): No    Lack of Transportation (Non-Medical): No  Physical Activity: Unknown (07/19/2022)   Received from [facility]   Exercise Vital Sign    On average, how many days per week do you engage in moderate to strenuous exercise (like a brisk walk)?: 0 days    Minutes of Exercise per Session: Not on file  Stress: Stress Concern Present (07/19/2022)   Received from [facility] - Occupational Stress Questionnaire    Feeling of Stress : To some extent  Social Connections: Moderately Integrated (07/19/2022)   Received from [facility]   Social Network    How would you rate your social network (family, work, friends)?: Adequate participation with social networks  Intimate Partner Violence: Not At Risk (10/22/2022)   Humiliation, Afraid, Rape, and Kick questionnaire    Fear of Current or Ex-Partner: No    Emotionally Abused: No    Physically Abused: No    Sexually Abused: No  Depression (PHQ2-9): Low Risk (06/24/2024)   Depression (PHQ2-9)    PHQ-2 Score: 0  Alcohol Screen: Not on file  Housing: Low Risk (10/22/2022)   Housing    Last Housing Risk Score: 0  Utilities: Not At Risk (10/22/2022)   AHC Utilities    Threatened with loss of utilities: No  Health Literacy: Not on file    FAMILY HISTORY:  Family History  Problem Relation Age of Onset   Colon polyps Brother    Breast cancer Neg Hx     CURRENT MEDICATIONS:  Outpatient Encounter Medications as of 06/24/2024  Medication Sig   acetaminophen   (TYLENOL ) 325 MG tablet Take 2 tablets (650 mg total) by mouth every 6 (six) hours as needed for mild pain or fever.   apixaban  (ELIQUIS ) 2.5 MG TABS tablet Take 1 tablet (2.5 mg total) by mouth 2 (two) times daily.   ascorbic acid (VITAMIN C) 500 MG tablet Take 500 mg by mouth daily.   Cranberry 450 MG TABS Take 1 tablet by mouth in the morning.   escitalopram  (LEXAPRO ) 20 MG tablet Take 20 mg by mouth daily.   ferrous sulfate  325 (65 FE) MG EC tablet Take 1 tablet (325 mg total) by mouth daily with breakfast.   fexofenadine  (ALLEGRA ) 60 MG tablet Take 1 tablet (60 mg total) by mouth 2 (two) times daily as needed for allergies or rhinitis.   lamoTRIgine (LAMICTAL) 100 MG tablet Take 100 mg by mouth daily.   levothyroxine  (SYNTHROID , LEVOTHROID) 75 MCG tablet Take 75 mcg by mouth at bedtime.   Menthol, Topical Analgesic, (BIOFREEZE) 10 % CREA Apply topically.   omeprazole (PRILOSEC) 40 MG capsule Take 40 mg by mouth daily.   polyethylene glycol (MIRALAX  / GLYCOLAX ) 17 g packet Take 17 g by mouth daily as needed.   potassium chloride  20 MEQ/15ML (10%) SOLN SMARTSIG:Milliliter(s) By Mouth   traZODone  (DESYREL ) 50 MG tablet Take 1 tablet (50 mg total) by mouth at bedtime as needed for sleep.   [DISCONTINUED] potassium chloride  (KLOR-CON ) 10 MEQ tablet Take 10 mEq by mouth daily.   [DISCONTINUED] pantoprazole  (PROTONIX ) 40 MG tablet Take 1 tablet (40 mg total) by mouth daily.   [DISCONTINUED] Vitamin D, Ergocalciferol, (DRISDOL) 1.25 MG (50000 UNIT) CAPS capsule Take 50,000 Units by mouth every 7 (seven) days.   No facility-administered encounter medications on file as of 06/24/2024.    ALLERGIES:  Allergies  Allergen Reactions   Feraheme  Clarke.clines ] Other (See Comments)  Chest tightness, dizzy, face was flushed. Medicated with famotidine  and solumedrol. Did not complete infusion. See progress note from 12/25/2023.   Gabapentin      Dizziness. States made her feel like she was drunk.  Unsteady gait and dizziness.         Lidocaine  Rash   Metoclopramide Rash   Other Other (See Comments)    DYE+Nitrofurantoin+Brilliant Blue Fcf   Sulfa Antibiotics Rash   Z-Pak [Azithromycin] Rash   Metoclopramide Hcl Other (See Comments)    REACTION: a drawing of all her muscles   Penicillins Other (See Comments)    Unknown    Procaine Hcl Other (See Comments)    Unknown    Macrodantin [Nitrofurantoin Macrocrystal] Rash    LABORATORY DATA:  I have reviewed the labs as listed.  CBC    Component Value Date/Time   WBC 8.4 12/19/2023 0742   RBC 3.87 12/19/2023 0742   HGB 12.4 12/19/2023 0742   HCT 38.3 12/19/2023 0742   PLT 361 12/19/2023 0742   MCV 99.0 12/19/2023 0742   MCH 32.0 12/19/2023 0742   MCHC 32.4 12/19/2023 0742   RDW 13.8 12/19/2023 0742   LYMPHSABS 1.9 12/19/2023 0742   MONOABS 0.9 12/19/2023 0742   EOSABS 0.6 (H) 12/19/2023 0742   BASOSABS 0.1 12/19/2023 0742      Latest Ref Rng & Units 12/06/2022    4:03 AM 12/03/2022    5:18 PM 10/31/2022    8:00 AM  CMP  Glucose 70 - 99 mg/dL 890  882  891   BUN 8 - 23 mg/dL 10  12  18    Creatinine 0.44 - 1.00 mg/dL 9.33  9.39  9.39   Sodium 135 - 145 mmol/L 138  136  134   Potassium 3.5 - 5.1 mmol/L 3.8  3.4  3.7   Chloride 98 - 111 mmol/L 102  103  99   CO2 22 - 32 mmol/L 28  26  24    Calcium 8.9 - 10.3 mg/dL 8.2  7.9  8.5     DIAGNOSTIC IMAGING:  I have independently reviewed the relevant imaging and discussed with the patient.   WRAP UP:  All questions were answered. The patient knows to call the clinic with any problems, questions or concerns.  Medical decision making: Moderate  Time spent on visit: I spent 20 minutes counseling the patient face to face. The total time spent in the appointment was 30 minutes and more than 50% was on counseling.  Stefanie CHRISTELLA Barefoot, [name]  06/24/24 9:11 AM  "

## 2024-06-24 ENCOUNTER — Inpatient Hospital Stay: Admitting: Physician Assistant

## 2024-06-24 VITALS — BP 130/76 | HR 80 | Temp 98.1°F | Resp 18 | Ht 64.0 in | Wt 142.0 lb

## 2024-06-24 DIAGNOSIS — M1990 Unspecified osteoarthritis, unspecified site: Secondary | ICD-10-CM | POA: Diagnosis not present

## 2024-06-24 DIAGNOSIS — Z8672 Personal history of thrombophlebitis: Secondary | ICD-10-CM | POA: Diagnosis not present

## 2024-06-24 DIAGNOSIS — D500 Iron deficiency anemia secondary to blood loss (chronic): Secondary | ICD-10-CM | POA: Diagnosis not present

## 2024-06-24 DIAGNOSIS — M797 Fibromyalgia: Secondary | ICD-10-CM | POA: Diagnosis not present

## 2024-06-24 DIAGNOSIS — I824Y2 Acute embolism and thrombosis of unspecified deep veins of left proximal lower extremity: Secondary | ICD-10-CM

## 2024-06-24 DIAGNOSIS — Z79890 Hormone replacement therapy (postmenopausal): Secondary | ICD-10-CM | POA: Diagnosis not present

## 2024-06-24 DIAGNOSIS — Z9049 Acquired absence of other specified parts of digestive tract: Secondary | ICD-10-CM | POA: Diagnosis not present

## 2024-06-24 DIAGNOSIS — Z87442 Personal history of urinary calculi: Secondary | ICD-10-CM | POA: Diagnosis not present

## 2024-06-24 DIAGNOSIS — E039 Hypothyroidism, unspecified: Secondary | ICD-10-CM | POA: Diagnosis not present

## 2024-06-24 DIAGNOSIS — Z880 Allergy status to penicillin: Secondary | ICD-10-CM | POA: Diagnosis not present

## 2024-06-24 DIAGNOSIS — D509 Iron deficiency anemia, unspecified: Secondary | ICD-10-CM | POA: Diagnosis present

## 2024-06-24 DIAGNOSIS — K219 Gastro-esophageal reflux disease without esophagitis: Secondary | ICD-10-CM | POA: Diagnosis not present

## 2024-06-24 DIAGNOSIS — Z83719 Family history of colon polyps, unspecified: Secondary | ICD-10-CM | POA: Diagnosis not present

## 2024-06-24 DIAGNOSIS — Z888 Allergy status to other drugs, medicaments and biological substances status: Secondary | ICD-10-CM | POA: Diagnosis not present

## 2024-06-24 DIAGNOSIS — Z8719 Personal history of other diseases of the digestive system: Secondary | ICD-10-CM | POA: Diagnosis not present

## 2024-06-24 DIAGNOSIS — Z881 Allergy status to other antibiotic agents status: Secondary | ICD-10-CM | POA: Diagnosis not present

## 2024-06-24 DIAGNOSIS — Z860100 Personal history of colon polyps, unspecified: Secondary | ICD-10-CM | POA: Diagnosis not present

## 2024-06-24 DIAGNOSIS — K589 Irritable bowel syndrome without diarrhea: Secondary | ICD-10-CM | POA: Diagnosis not present

## 2024-06-24 DIAGNOSIS — Z90710 Acquired absence of both cervix and uterus: Secondary | ICD-10-CM | POA: Diagnosis not present

## 2024-06-24 DIAGNOSIS — Z7901 Long term (current) use of anticoagulants: Secondary | ICD-10-CM | POA: Diagnosis not present

## 2024-06-24 DIAGNOSIS — F0393 Unspecified dementia, unspecified severity, with mood disturbance: Secondary | ICD-10-CM | POA: Diagnosis not present

## 2024-06-24 DIAGNOSIS — Z882 Allergy status to sulfonamides status: Secondary | ICD-10-CM | POA: Diagnosis not present

## 2024-06-24 DIAGNOSIS — Z79899 Other long term (current) drug therapy: Secondary | ICD-10-CM | POA: Diagnosis not present

## 2024-06-24 NOTE — Patient Instructions (Signed)
 [facility] **VISIT SUMMARY & IMPORTANT INSTRUCTIONS **   You were seen today by Pleasant Barefoot PA-C for your anemia and blood clot.    BLOOD CLOT: Your blood clot has gone away, but you remain at high risk for having more blood clots in the future. Continue Eliquis  (apixaban ) 2.5 mg twice daily to reduce risk of future blood clots.  ANEMIA Your blood levels look great, but your iron level remains mildly low.  We will schedule you for 1 dose of IV iron. Continue taking iron supplement daily.  FOLLOW-UP APPOINTMENT: 6 months  ** Thank you for trusting me with your healthcare!  I strive to provide all of my patients with quality care at each visit.  If you receive a survey for this visit, I would be so grateful to you for taking the time to provide feedback.  Thank you in advance!  ~ Akaash Vandewater                                        Dr. Mickiel Davonna Pleasant Barefoot, PA-C          Delon Hope, NP   - - - - - - - - - - - - - - - - - -     Thank you for choosing [facility] at [facility] to provide your oncology and hematology care.  To afford each patient quality time with our provider, please arrive at least 15 minutes before your scheduled appointment time.   If you have a lab appointment with the Cancer Center please come in thru the Main Entrance and check in at the main information desk.  You need to re-schedule your appointment should you arrive 10 or more minutes late.  We strive to give you quality time with our providers, and arriving late affects you and other patients whose appointments are after yours.  Also, if you no show three or more times for appointments you may be dismissed from the clinic at the providers discretion.     Again, thank you for choosing [facility].  Our hope is that these requests will decrease the amount of time that you wait before being seen by our physicians.        _____________________________________________________________  Should you have questions after your visit to [facility], please contact our office at [phone] and follow the prompts.  Our office hours are 8:00 a.m. and 4:30 p.m. Monday - Friday.  Please note that voicemails left after 4:00 p.m. may not be returned until the following business day.  We are closed weekends and major holidays.  You do have access to a nurse 24-7, just call the main number to the clinic [phone] and do not press any options, hold on the line and a nurse will answer the phone.    For prescription refill requests, have your pharmacy contact our office and allow 72 hours.

## 2024-07-01 ENCOUNTER — Inpatient Hospital Stay

## 2024-07-08 ENCOUNTER — Inpatient Hospital Stay: Attending: Hematology

## 2024-07-18 ENCOUNTER — Inpatient Hospital Stay: Attending: Hematology

## 2024-12-16 ENCOUNTER — Inpatient Hospital Stay

## 2024-12-23 ENCOUNTER — Inpatient Hospital Stay: Admitting: Physician Assistant
